# Patient Record
Sex: Female | Born: 1935 | Race: Black or African American | Hispanic: No | Smoking: Former smoker
Health system: Southern US, Community
[De-identification: ages and names within clinical notes are randomized; demographics above are authoritative.]

## PROBLEM LIST (undated history)

## (undated) ENCOUNTER — Emergency Department (HOSPITAL_COMMUNITY): Payer: Medicare Other | Source: Home / Self Care

## (undated) DIAGNOSIS — I495 Sick sinus syndrome: Secondary | ICD-10-CM

## (undated) DIAGNOSIS — M109 Gout, unspecified: Secondary | ICD-10-CM

## (undated) DIAGNOSIS — N200 Calculus of kidney: Secondary | ICD-10-CM

## (undated) DIAGNOSIS — I10 Essential (primary) hypertension: Secondary | ICD-10-CM

## (undated) DIAGNOSIS — F329 Major depressive disorder, single episode, unspecified: Secondary | ICD-10-CM

## (undated) DIAGNOSIS — E669 Obesity, unspecified: Secondary | ICD-10-CM

## (undated) DIAGNOSIS — H409 Unspecified glaucoma: Secondary | ICD-10-CM

## (undated) DIAGNOSIS — I5022 Chronic systolic (congestive) heart failure: Secondary | ICD-10-CM

## (undated) DIAGNOSIS — F32A Depression, unspecified: Secondary | ICD-10-CM

## (undated) DIAGNOSIS — J449 Chronic obstructive pulmonary disease, unspecified: Secondary | ICD-10-CM

## (undated) DIAGNOSIS — I429 Cardiomyopathy, unspecified: Secondary | ICD-10-CM

## (undated) DIAGNOSIS — M1990 Unspecified osteoarthritis, unspecified site: Secondary | ICD-10-CM

## (undated) DIAGNOSIS — N183 Chronic kidney disease, stage 3 unspecified: Secondary | ICD-10-CM

## (undated) DIAGNOSIS — K219 Gastro-esophageal reflux disease without esophagitis: Secondary | ICD-10-CM

## (undated) HISTORY — PX: INSERT / REPLACE / REMOVE PACEMAKER: SUR710

## (undated) HISTORY — PX: TUBAL LIGATION: SHX77

## (undated) HISTORY — DX: Chronic obstructive pulmonary disease, unspecified: J44.9

## (undated) HISTORY — DX: Essential (primary) hypertension: I10

## (undated) HISTORY — DX: Chronic kidney disease, stage 3 unspecified: N18.30

## (undated) HISTORY — DX: Major depressive disorder, single episode, unspecified: F32.9

## (undated) HISTORY — DX: Depression, unspecified: F32.A

## (undated) HISTORY — DX: Gout, unspecified: M10.9

## (undated) HISTORY — DX: Obesity, unspecified: E66.9

## (undated) HISTORY — DX: Cardiomyopathy, unspecified: I42.9

## (undated) HISTORY — DX: Chronic systolic (congestive) heart failure: I50.22

## (undated) HISTORY — PX: CHOLECYSTECTOMY OPEN: SUR202

## (undated) HISTORY — DX: Sick sinus syndrome: I49.5

## (undated) HISTORY — DX: Chronic kidney disease, stage 3 (moderate): N18.3

## (undated) HISTORY — PX: KIDNEY STONE SURGERY: SHX686

---

## 1997-10-04 ENCOUNTER — Ambulatory Visit (HOSPITAL_COMMUNITY): Admission: RE | Admit: 1997-10-04 | Discharge: 1997-10-05 | Payer: Self-pay | Admitting: *Deleted

## 1999-01-10 ENCOUNTER — Ambulatory Visit (HOSPITAL_COMMUNITY): Admission: RE | Admit: 1999-01-10 | Discharge: 1999-01-10 | Payer: Self-pay | Admitting: Cardiology

## 1999-01-10 ENCOUNTER — Encounter: Payer: Self-pay | Admitting: Cardiology

## 2000-09-10 ENCOUNTER — Encounter: Payer: Self-pay | Admitting: Cardiology

## 2000-09-10 ENCOUNTER — Ambulatory Visit (HOSPITAL_COMMUNITY): Admission: RE | Admit: 2000-09-10 | Discharge: 2000-09-10 | Payer: Self-pay | Admitting: Cardiology

## 2000-10-23 HISTORY — PX: CARDIAC CATHETERIZATION: SHX172

## 2000-10-28 ENCOUNTER — Ambulatory Visit (HOSPITAL_COMMUNITY): Admission: RE | Admit: 2000-10-28 | Discharge: 2000-10-28 | Payer: Self-pay | Admitting: *Deleted

## 2003-04-18 ENCOUNTER — Emergency Department (HOSPITAL_COMMUNITY): Admission: EM | Admit: 2003-04-18 | Discharge: 2003-04-18 | Payer: Self-pay | Admitting: Emergency Medicine

## 2003-09-20 ENCOUNTER — Inpatient Hospital Stay (HOSPITAL_COMMUNITY): Admission: EM | Admit: 2003-09-20 | Discharge: 2003-09-22 | Payer: Self-pay | Admitting: Emergency Medicine

## 2003-09-21 ENCOUNTER — Encounter (INDEPENDENT_AMBULATORY_CARE_PROVIDER_SITE_OTHER): Payer: Self-pay | Admitting: *Deleted

## 2006-04-08 ENCOUNTER — Ambulatory Visit (HOSPITAL_COMMUNITY): Admission: RE | Admit: 2006-04-08 | Discharge: 2006-04-09 | Payer: Self-pay | Admitting: Cardiology

## 2006-11-24 ENCOUNTER — Emergency Department (HOSPITAL_COMMUNITY): Admission: EM | Admit: 2006-11-24 | Discharge: 2006-11-24 | Payer: Self-pay | Admitting: Family Medicine

## 2008-10-18 ENCOUNTER — Inpatient Hospital Stay (HOSPITAL_COMMUNITY): Admission: EM | Admit: 2008-10-18 | Discharge: 2008-10-19 | Payer: Self-pay | Admitting: Emergency Medicine

## 2010-04-15 ENCOUNTER — Encounter (HOSPITAL_COMMUNITY)
Admission: RE | Admit: 2010-04-15 | Discharge: 2010-05-23 | Payer: Self-pay | Source: Home / Self Care | Attending: Family Medicine | Admitting: Family Medicine

## 2010-06-14 ENCOUNTER — Encounter: Payer: Self-pay | Admitting: Family Medicine

## 2010-09-02 LAB — CK TOTAL AND CKMB (NOT AT ARMC): Relative Index: INVALID (ref 0.0–2.5)

## 2010-09-02 LAB — POCT I-STAT, CHEM 8
BUN: 25 mg/dL — ABNORMAL HIGH (ref 6–23)
Calcium, Ion: 1.19 mmol/L (ref 1.12–1.32)
Chloride: 107 mEq/L (ref 96–112)
Creatinine, Ser: 1.5 mg/dL — ABNORMAL HIGH (ref 0.4–1.2)
Glucose, Bld: 84 mg/dL (ref 70–99)
HCT: 36 % (ref 36.0–46.0)
Hemoglobin: 12.2 g/dL (ref 12.0–15.0)
Potassium: 4.4 mEq/L (ref 3.5–5.1)
TCO2: 29 mmol/L (ref 0–100)

## 2010-09-02 LAB — POCT CARDIAC MARKERS
CKMB, poc: 1.3 ng/mL (ref 1.0–8.0)
Troponin i, poc: <0.05 ng/mL (ref 0.00–0.09)

## 2010-09-02 LAB — CARDIAC PANEL(CRET KIN+CKTOT+MB+TROPI)
Relative Index: INVALID (ref 0.0–2.5)
Total CK: 68 U/L (ref 7–177)
Total CK: 89 U/L (ref 7–177)
Troponin I: 0.05 ng/mL (ref 0.00–0.06)

## 2010-09-02 LAB — TSH: TSH: 0.304 u[IU]/mL — ABNORMAL LOW (ref 0.350–4.500)

## 2010-09-12 ENCOUNTER — Other Ambulatory Visit (HOSPITAL_COMMUNITY): Payer: Self-pay | Admitting: Family Medicine

## 2010-09-12 DIAGNOSIS — R0600 Dyspnea, unspecified: Secondary | ICD-10-CM

## 2010-09-15 ENCOUNTER — Other Ambulatory Visit (HOSPITAL_COMMUNITY): Payer: Self-pay

## 2010-10-07 ENCOUNTER — Ambulatory Visit (HOSPITAL_COMMUNITY): Payer: Medicare Other | Attending: Family Medicine | Admitting: Radiology

## 2010-10-07 DIAGNOSIS — R0600 Dyspnea, unspecified: Secondary | ICD-10-CM

## 2010-10-07 DIAGNOSIS — R0989 Other specified symptoms and signs involving the circulatory and respiratory systems: Secondary | ICD-10-CM

## 2010-10-07 DIAGNOSIS — I080 Rheumatic disorders of both mitral and aortic valves: Secondary | ICD-10-CM | POA: Insufficient documentation

## 2010-10-07 DIAGNOSIS — I079 Rheumatic tricuspid valve disease, unspecified: Secondary | ICD-10-CM | POA: Insufficient documentation

## 2010-10-07 DIAGNOSIS — R0609 Other forms of dyspnea: Secondary | ICD-10-CM | POA: Insufficient documentation

## 2010-10-08 ENCOUNTER — Encounter: Payer: Self-pay | Admitting: Family Medicine

## 2010-10-10 NOTE — Discharge Summary (Signed)
[name], [name]                ACCOUNT NO.:  [id number]   MEDICAL RECORD NO.:  [id number]          PATIENT TYPE:  OIB   LOCATION:  [id number]                         FACILITY:  [facility]   PHYSICIAN:  Barnett Abu, M.D.  DATE OF BIRTH:  [birth year]   DATE OF ADMISSION:  04/08/2006  DATE OF DISCHARGE:  04/09/2006                               DISCHARGE SUMMARY   DISCHARGE DIAGNOSES:  1. Tachybrady syndrome status post initial pacemaker in 1993, battery      change out in 2002, and then generator change out April 08, 2006      with ventricular lead revision.  2. Hypertension.  3. Obesity.  4. Remote cholecystectomy.  5. Long-term medication use.   HOSPITAL COURSE:  Ms. Altimari is an 77 year old female with a known  history of tachybrady syndrome and underwent a permanent pacemaker as  described above.  She is at end of life and she underwent a pacemaker  generator change out on April 08, 2006 with a ventricular lead  revision.  The patient tolerated the procedure well and remained in the  hospital overnight.  She was discharged home in stable condition.   DISCHARGE MEDICATIONS:  Similar to her home medications, which include:   1. Clonidine 0.3 mg twice a day.  2. Toprol XL 100 mg a day.  3. Baby aspirin daily.  4. Cardizem CD 120 mg daily.  5. Lisinopril 40 mg a day.  6. Diovan/hydrochlorothiazide 80/12.5 mg 1 a day.   DISCHARGE INSTRUCTIONS:  She is to remain on a low-fat diet.  A  discharge sheet outlining the activity and wound care was given to the  patient.  She may utilize Tylenol as needed for pain.  She is to call  for any questions or concerns, any swelling, redness, or drainage of the  pacemaker site.      Joesphine Bare, P.A.      Barnett Abu, M.D.  Electronically Signed    LB/MEDQ  D:  04/09/2006  T:  04/09/2006  Job:  [id number]

## 2010-10-10 NOTE — Op Note (Signed)
[place]  Patient:    [name], [name]                       MRN: [id number] Proc. Date: 09/10/00 Adm. Date:  [id number] Disc. Date: [id number] Attending:  Rodell Perna CC:         Jerrye Beavers, M.D.  Robyn Haber, M.D.   Operative Report  PROCEDURE PERFORMED: 1. Removal of permanent pacemaker generator. 2. Replace permanent pacemaker for battery change.  INDICATIONS: Ms. [name] is a 77 year old woman with a pacemaker in place due to tachy-brady syndrome.  It has now reached elective replacement interval.  She is undergoing battery change at this time.  PROCEDURAL NOTE: The patient was brought to the cardiac catheterization laboratory in the post ______ state.  The right prepectoral region was prepped and draped in the usual sterile fashion.  Local anesthesia was obtained with the infiltration of 1% lidocaine.  A 6-7 cm incision was made over the previous pacemaker insertion suture line.  It was carried down by sharp dissection to the prepacemaker fibrous capsule. This was incised, and the pacemaker was delivered.  The leads were disconnected from the pacing generator. Each lead was tested and will be the results as noted below.  The pocket was then copiously irrigated with 1% Kanamycin solution.  The leads were attached into the pacing generator, and the pacing generator was placed back in the pocket. The wound was examined for bleeding, and none was found.  The subcutaneous tissue was approximated using 2-0 Dexon in a running fashion.  The skin was approximated using 5-0 Dexon in a running subcuticular fashion.  Steri-Strips and a sterile dressing were applied.  The patient was transported to the recovery area in a stable condition.  EQUIPMENT DATA: DD:  09/10/00 TD:  09/11/00 Job: [id number]

## 2010-10-10 NOTE — Consult Note (Signed)
NAME:  [name], [name]                          ACCOUNT NO.:  [id number]   MEDICAL RECORD NO.:  [id number]                   PATIENT TYPE:  INP   LOCATION:  2108                                 FACILITY:  [facility]   PHYSICIAN:  Deboraha Sprang, M.D.               DATE OF BIRTH:  1922-12-16   DATE OF CONSULTATION:  09/21/2003  DATE OF DISCHARGE:                                   CONSULTATION   REASON FOR CONSULTATION:  Thank you very much for asking me to see Mrs.  [name] in consultation for recurrence of her ventricular tachycardia.   HISTORY:  She is a 77 year old lady who is not able to give a very good  history, but apparently has had recurrent episodes of exercise intolerance  which have been associated with tachy palpitations.  They are also  associated with fatigue, but she denies lightheadedness.  Yesterday, she  presented to Dr. Don Broach office for pacemaker interrogation, where she was  noted to go into a wide complex tachycardia.  Internal electrograms were  obtained via the pacemaker (see below).  The patient's symptoms were only  modest.  The patient's tachycardia subsequently stopped, and the patient was  then referred for further care, where she subsequently ruled out for  myocardial infarction.  Her TSH was found to be a little bit low, and she  has had no recurrent episodes.   She was initially treated with amiodarone and Lovenox, but these have been  discontinued.   PAST CARDIAC HISTORY:  Notable for a catheterization in June 2002 which  demonstrated normal coronaries.  She also had mild left ventricular  dysfunction at that time with an ejection fraction of about 45%.   PAST MEDICAL HISTORY:  1. Notable for hypertension.  2. Arthritis.  3. There is an issue of medical compliance in the past.   MEDICATIONS:  Her medications on arrival to the office included:  1. Clonidine 0.3 b.i.d.  2. Toprol 100.  3. Altace 10 b.i.d.  4. Celebrex.  5. Diovan 80.  6.  Maxzide 37.5/25.  7. Aspirin.  8. Potassium supplements, not withstanding that her potassium was low.   ALLERGIES:  CODEINE.   REVIEW OF SYSTEMS:  Noted on the intake sheet from [name], R.N.P., and  are not further recounted here.   SOCIAL HISTORY:  She lives here in [place].  She smokes.  She does not  use alcohol.  Parenthetically, it is her birthday today.   PHYSICAL EXAMINATION:  GENERAL:  She is an elderly African-American female  appearing her stated age.  VITAL SIGNS:  Her blood pressure is 164/64, pulse 67.  Her blood pressure  has been about 156/72, and I do not have a weight for the patient.  HEENT:  No icterus.  No xanthoma.  The neck veins were flat.  The carotids  were brisk and full bilaterally without bruits.  BACK:  Without  kyphosis or scoliosis.  LUNGS:  Clear.  HEART:  Heart sounds were regular without murmurs or gallops.  ABDOMEN:  Soft with active bowel sounds without midline pulsation.  EXTREMITIES:  Femoral pulses were 2+.  Distal pulses were intact.  There was  no clubbing, cyanosis, or edema.  NEUROLOGIC:  Grossly normal.   ELECTROCARDIOGRAMS:  Electrocardiograms are numerous:  1. Electrocardiogram September 20, 2003, [id number], demonstrates sinus rhythm.     Intervals of 0.17/0.12/0.36 with a leftward axis of -70 with a right     bundle branch block, left anterior fascicular block entrancing the     precordial concordance.  2. Electrocardiogram September 20, 2003, [id number], demonstrates a similar     morphology, tachycardia, with a cycle length now of approximately 300 ms.   Review of the internal electrograms represents a tachycardia with a cycle  length that is fairly stable at 300 ms.  We can also identify an AV  relationship with ventricular sensing off of the atrial lead.  This  demonstrated that the Delta AA intervals preceded the Delta VV intervals.  VA time measuring from the onset of surface QRS to the atrial electrogram  was approximately 0  ms.   IMPRESSION:  1. Supraventricular tachycardia, probable AV nodal reentry with a cycle     length of about 280 ms.  2. Baseline right AV block with left axis deviation.  3. History of bradycardia, status post St. Jude's pacemaker implantation.  4. Negative coronary artery disease with left ventricular ejection fraction     of 45% a couple of years ago.  5. Hypertension.  6. Presyncope and lightheadedness associated with #1.   [name] has recurrent supraventricular tachycardia, probably AV nodal  reentry.  The symptoms with this are modest.  After talking with her, it is  my impression that she does not feel that her symptoms are all that  significant, notwithstanding the alarm that her symptoms created at the  physician's office yesterday.   I discussed treatment options with Dr. Jaci Standard, who, because of concerns  about medication compliance, as well as the recurrence of events, felt that  RS catheter ablation would be her primarily therapy; however, the patient  would like to continue her medical therapy and not undertake any procedures  currently.   Given that, I would recommend that we try adding a low-dose calcium blocker  to her beta blocker.  Her blood pressure certainly would suffice, and we do  not have to be concerned about significant bradycardia.   Thank you for this consultation.                                               Deboraha Sprang, M.D.    SCK/MEDQ  D:  09/21/2003  T:  09/21/2003  Job:  [id number]   cc:   Fabio Asa, M.D.  [place] [place] [address]  Fax: [phone]   Electrophysiology Laboratory   [facility]

## 2010-10-10 NOTE — Cardiovascular Report (Signed)
[place]  Patient:    [name], [name]                       MRN: [id number] Proc. Date: 10/28/00 Adm. Date:  [id number] Attending:  Fabio Asa A CC:         Robyn Haber, M.D.   Cardiac Catheterization  INDICATIONS:  This is a 77 year old female with chief complaint of dyspnea, fatigue, and atypical chest pain.  Adenosine Cardiolite was performed and the patient was noted to have decreased uptake in the anterior apical wall with associated wall motion abnormalities.  The risks, benefits, and options were discussed with the patient.  She subsequently was referred for coronary angiography.  PROCEDURE:  After obtaining written informed consent, the patient was brought to the cardiac catheterization laboratory in the postabsorptive state. Preoperative sedation was achieved using IV Versed, IV Benadryl.  The right groin was prepped and draped in the usual sterile fashion.  Local anesthesia was achieved using 1% Xylocaine.  A 6-French hemostasis sheath was placed in the right femoral artery using the modified Seldinger technique.  Selective coronary angiography was performed using a JL-4 and JR-4 Judkins catheter. Single-plane ventriculogram was performed in the RAO position using a 6-French straight pigtail catheter.  All catheter exchanges were made over a guide wire.  FINDINGS:  The aortic pressure was 200/82.  LV pressure was 200/30.  Single-plane ventriculogram confirmed anterior/interior apical hypokinesis to akinesis.  Ejection fraction was 45%.  CORONARY ANGIOGRAPHY: Left main coronary:  The left main coronary artery bifurcated into the left anterior descending and circumflex vessels.  There was no significant disease in the left main coronary artery.  Left anterior descending:  The left anterior descending gave rise to a small D-1, D-2, D-3, and D-4, then went on to end as an apical recurrent branch. There was no significant  disease in the left anterior descending or its branches.  Circumflex:  The circumflex vessel was a large dominant artery giving rise to a large OM-1, large OM-2, and a large PL branch.  There was no significant disease in the circumflex or its branches.  Right coronary artery:  The right coronary artery was nondominant and gave rise to multiple RV marginal branches.  IMPRESSION: 1. Anterior/interior apical hypokinesis to akinesis with ejection fraction of    approximately 45%. 2. Normal coronary angiography.  RECOMMENDATION:  Continue with medical management of her LV dysfunction and hypertension.  We will consider adding diuretic to her Toprol and Altace regimen. DD:  10/28/00 TD:  10/28/00 Job: [id number]

## 2010-10-10 NOTE — Op Note (Signed)
[place]  Patient:    [name], [name]                       MRN: [id number] Proc. Date: 09/29/00 Adm. Date:  [id number] Disc. Date: [id number] Attending:  Rodell Perna                           Operative Report  ADDENDUM:  EQUIPMENT DATA:  The pacing generator inserted was a Human resources officer number [id number], serial number [id number].  The device explanted was a [employer] II DD model number D2497086, serial number [id number].  PACING DATA:  The atrial lead detected a 3.9 mV P-wave.  The pacing threshold was 0.8 volts at 0.5 msec.  The current was 1.6 mA and the impedance was 500 ohms.  This was an [place] number [id number], serial number [id number].  The ventricular lead detected an 11.8 mV R-wave.  The pacing threshold was 0.7 volts at 0.5 msec pulse width.  The current was 0.4 mA and the impedance was 480 ohms.  This was an intermedics model number 431-07, serial number [id number]. D:  09/29/00 TD:  09/29/00 Job: [id number]

## 2010-10-10 NOTE — Op Note (Signed)
NAMECAMILLIA, [name]                ACCOUNT NO.:  [id number]   MEDICAL RECORD NO.:  [id number]          PATIENT TYPE:  OIB   LOCATION:  2807                         FACILITY:  [facility]   PHYSICIAN:  Barnett Abu, M.D.  DATE OF BIRTH:  1923/01/04   DATE OF PROCEDURE:  04/08/2006  DATE OF DISCHARGE:                                 OPERATIVE REPORT   PROCEDURES PERFORMED:  1. Explant old pacing generator.  2. Ventricular lead replacement/revision.  3. Implant new pacing generator.   INDICATION:  [name] is an 77 year old woman who has a Actor DR, model number [id number], implanted September 10, 2000, now at [facility].  She is to undergo battery replacement.  During the procedure, there was  noted to be very low impedance on the ventricular lead and obvious  insulation failure.  It, therefore, required replacement.   PROCEDURE NOTE:  The patient is brought to the cardiac catheterization  laboratory in a fasting state.  The right prepectoral region was prepped and  draped in the usual sterile fashion.  Local anesthesia was obtained with  infiltration of 1% lidocaine with epinephrine throughout this area.  A 6-7  cm incision was then made over the old pacemaker incision site and this was  carried down by blunt dissection to the pacemaker capsule.  The capsule was  incised and after some modification of the pocket, the pacemaker device was  delivered.  The pacing leads were tested.  The atrial lead had normal  parameters as will be mentioned below.  The ventricular lead, as noted had  low impedance and the insufflation failure.  It was, therefore, cut and  capped as well as tied off with 0 silk.  The right subclavian vein was then  punctured using a micropuncture needle and subsequently a 0.038 wire was  advanced into the right atrium.  A 7-French tearaway sheath and dilator were  then advanced over this wire.  The wire and dilator were removed and the  ventricular lead advanced  to the level of the right atrium.  Using standard  technique and fluoroscopic landmarks, the lead was manipulated into the  right ventricular apex.  There, excellent pacing parameters were obtained as  will be noted below.  This was an active fixation device and the screw was  advanced as appropriate.  The lead was tested for diaphragmatic pacing at 10  volts and none was found.  The lead was then sutured into place using three  separate silk ligatures.  The pocket was then copiously irrigated with 1%  kanamycin solution.  An expansion of the pocket was required.  This was done  by blunt and sharp dissection.  The pacing leads were then attached to the  new pacing generator, carefully identifying each by its serial number, and  placing each into the appropriate receptacle.  Each lead was tightened into  place and tested for security.  The pacing generator was then placed in the  pocket with a small amount of ventricular lead coiled beneath it.  The  pocket was then closed using 2-0  Vicryl in a running fashion for the  subcutaneous layer.  The skin was approximated using 4-0 Vicryl in a running  subcuticular fashion.  Steri-Strips and a sterile dressing were applied.  The patient was transported to the recovery area in stable condition.   EQUIPMENT DATA:  The new pacing generator is a St. Jude Varity ADX XL DR,  model number [id number], serial number [id number].  The new right ventricular lead is  a St. Jude, model number [id number], serial number [id number].  The atrial lead is  an [employer], model number [id number], serial number [id number], date of implant  March 15, 1992.   PACING DATA:  The ventricular lead detected an 9.3 mV R-wave.  The pacing  threshold was 0.8 volts at 0.5 milliseconds pulse width.  The impedance was  815 ohms resulting in a current at capture threshold of 1 MA.  The  atrial lead detected 3.6 mV P-wave.  The impedance was 316 ohms.  The  threshold was 0.4 volts at 0.5 milliseconds  resulting in a current at  capture threshold of 1.8 MA.  The impedance on the old right ventricular  lead before replacement was 238 ohms.      Barnett Abu, M.D.  Electronically Signed     JHE/MEDQ  D:  04/08/2006  T:  04/08/2006  Job:  [id number]

## 2010-11-27 ENCOUNTER — Encounter: Payer: Self-pay | Admitting: Family Medicine

## 2010-11-27 DIAGNOSIS — I495 Sick sinus syndrome: Secondary | ICD-10-CM | POA: Insufficient documentation

## 2010-11-27 DIAGNOSIS — I10 Essential (primary) hypertension: Secondary | ICD-10-CM | POA: Insufficient documentation

## 2010-11-27 DIAGNOSIS — F32A Depression, unspecified: Secondary | ICD-10-CM | POA: Insufficient documentation

## 2010-11-27 DIAGNOSIS — F329 Major depressive disorder, single episode, unspecified: Secondary | ICD-10-CM | POA: Insufficient documentation

## 2010-11-27 DIAGNOSIS — E669 Obesity, unspecified: Secondary | ICD-10-CM | POA: Insufficient documentation

## 2012-01-07 ENCOUNTER — Ambulatory Visit: Payer: Medicare Other | Admitting: Internal Medicine

## 2012-01-29 ENCOUNTER — Encounter: Payer: Self-pay | Admitting: Internal Medicine

## 2012-01-29 ENCOUNTER — Ambulatory Visit (INDEPENDENT_AMBULATORY_CARE_PROVIDER_SITE_OTHER): Payer: Medicare Other | Admitting: *Deleted

## 2012-01-29 ENCOUNTER — Ambulatory Visit (INDEPENDENT_AMBULATORY_CARE_PROVIDER_SITE_OTHER): Payer: Medicare Other | Admitting: Cardiovascular Disease

## 2012-01-29 VITALS — BP 130/80 | HR 74 | Resp 16

## 2012-01-29 DIAGNOSIS — I495 Sick sinus syndrome: Secondary | ICD-10-CM

## 2012-01-29 DIAGNOSIS — R0600 Dyspnea, unspecified: Secondary | ICD-10-CM

## 2012-01-29 DIAGNOSIS — R0989 Other specified symptoms and signs involving the circulatory and respiratory systems: Secondary | ICD-10-CM

## 2012-01-29 DIAGNOSIS — J810 Acute pulmonary edema: Secondary | ICD-10-CM | POA: Insufficient documentation

## 2012-01-29 DIAGNOSIS — Z950 Presence of cardiac pacemaker: Secondary | ICD-10-CM

## 2012-01-29 DIAGNOSIS — R0609 Other forms of dyspnea: Secondary | ICD-10-CM

## 2012-01-29 DIAGNOSIS — I471 Supraventricular tachycardia: Secondary | ICD-10-CM

## 2012-01-29 DIAGNOSIS — I498 Other specified cardiac arrhythmias: Secondary | ICD-10-CM

## 2012-01-29 LAB — PACEMAKER DEVICE OBSERVATION
BAMS-0001: 180 {beats}/min
BAMS-0003: 70 {beats}/min
BATTERY VOLTAGE: 2.76 V
DEVICE MODEL PM: 1124188

## 2012-01-29 NOTE — Assessment & Plan Note (Signed)
Well controlled.  Continue current medications and low sodium Dash type diet.    

## 2012-01-29 NOTE — Progress Notes (Signed)
Patient ID: [name], female   DOB: [birth year], 78 y.o.   MRN: [id number] 78 yo patient of Dr Caryl Comes  Last seen 2005 but main issue is SVT and tachy brady with previous pacer. Pacer is Financial planner.  She was in [place] with her son but has moved back here to live with daughter.  Poor historian but I dont think her pacer has been followed closely.  History of SVT but she declined ablation in past.  EF 45% with previous cath no CAD  Has some exertional dyspnea and LE edema.  Ambulates with cane. Denies palpitations, PND orhtopnea or chest pain.    Pacer Interogation:  Battery is fine.  Lead impedence is fine.  Program DDDR AT/AF response DDIR   Frequent mode swithes but very short duration No stored EMGls  ? Many short bursts SVT AMS episodes 1842    Echo 5/12 Study Conclusions  - Left ventricle: The cavity size was normal. Wall thickness was increased in a pattern of mild LVH. Systolic function was normal. The estimated ejection fraction was in the range of 55% to 60%. Wall motion was normal; there were no regional wall motion abnormalities. Doppler parameters are consistent with abnormal left ventricular relaxation (grade 1 diastolic dysfunction). Doppler parameters are consistent with high ventricular filling pressure. - Aortic valve: Trivial regurgitation. - Mitral valve: Mild regurgitation. - Left atrium: The atrium was mildly dilated. - Right atrium: The atrium was mildly dilated. - Atrial septum: There was an atrial septal aneurysm. - Pulmonary arteries: Systolic pressure was mildly increased. PA peak pressure: 20mm Hg (S).   ROS: Denies fever, malais, weight loss, blurry vision, decreased visual acuity, cough, sputum, SOB, hemoptysis, pleuritic pain, palpitaitons, heartburn, abdominal pain, melena, lower extremity edema, claudication, or rash.  All other systems reviewed and negative   General: Affect appropriate Obese elderly black female HEENT: normal Neck supple  with no adenopathy JVP normal no bruits no thyromegaly Lungs clear with no wheezing and good diaphragmatic motion Heart:  S1/S2 no murmur,rub, gallop or click PMI normal Abdomen: benighn, BS positve, no tenderness, no AAA no bruit.  No HSM or HJR Distal pulses intact with no bruits No edema Neuro non-focal Skin warm and dry No muscular weakness Pacer under lright  clavicle  Medications Current Outpatient Prescriptions  Medication Sig Dispense Refill  . acetaminophen (TYLENOL) 160 MG tablet Take 160 mg by mouth every 6 (six) hours as needed.        Marland Kitchen amLODipine (NORVASC) 10 MG tablet Take 10 mg by mouth daily.        . cloNIDine (CATAPRES) 0.3 MG tablet Take 0.3 mg by mouth 2 (two) times daily.        Marland Kitchen diltiazem (CARDIZEM CD) 120 MG 24 hr capsule Take 120 mg by mouth daily.        . ergocalciferol (VITAMIN D2) 50000 UNITS capsule Take 50,000 Units by mouth once a week.        Marland Kitchen lisinopril (PRINIVIL,ZESTRIL) 40 MG tablet Take 40 mg by mouth daily.        . metoprolol (TOPROL-XL) 100 MG 24 hr tablet Take 100 mg by mouth daily.        . valsartan-hydrochlorothiazide (DIOVAN-HCT) 160-12.5 MG per tablet Take 1 tablet by mouth daily.          Allergies Review of patient's allergies indicates no known allergies.  Family History: No family history on file.  Social History: History   Social History  . Marital  Status: Widowed    Spouse Name: N/A    Number of Children: N/A  . Years of Education: N/A   Occupational History  . Not on file.   Social History Main Topics  . Smoking status: Not on file  . Smokeless tobacco: Not on file  . Alcohol Use: Not on file  . Drug Use: Not on file  . Sexually Active: Not on file   Other Topics Concern  . Not on file   Social History Narrative  . No narrative on file    Electrocardiogram:  Assessment and Plan

## 2012-01-29 NOTE — Patient Instructions (Addendum)
Your physician recommends that you schedule a follow-up appointment in: NEEDS F/U WITH DR Argyle Crowley Your physician recommends that you continue on your current medications as directed. Please refer to the Current Medication list given to you today. Your physician has requested that you have an echocardiogram. Echocardiography is a painless test that uses sound waves to create images of your heart. It provides your doctor with information about the size and shape of your heart and how well your heart's chambers and valves are working. This procedure takes approximately one hour. There are no restrictions for this procedure.

## 2012-01-29 NOTE — Assessment & Plan Note (Signed)
Discussed with Melton Alar  Will interogate pacer today. Not dependant.  Continue beta blocker F/U Dr Caryl Comes

## 2012-01-29 NOTE — Progress Notes (Signed)
PPM check 

## 2012-01-29 NOTE — Assessment & Plan Note (Signed)
History of EF 45%  Mild LE edema Continue lasix  Echo to reassess EF

## 2012-01-29 NOTE — Assessment & Plan Note (Signed)
See Dr Aquilla Hacker prevoius note.  Patient not symptomatic and previously declined ablation Contineu beta blocker and F/U with SK

## 2012-02-03 ENCOUNTER — Other Ambulatory Visit (HOSPITAL_COMMUNITY): Payer: Medicare Other

## 2012-02-23 ENCOUNTER — Ambulatory Visit (HOSPITAL_COMMUNITY): Payer: Medicare Other | Attending: Cardiology

## 2012-02-23 DIAGNOSIS — I080 Rheumatic disorders of both mitral and aortic valves: Secondary | ICD-10-CM | POA: Insufficient documentation

## 2012-02-23 DIAGNOSIS — R0989 Other specified symptoms and signs involving the circulatory and respiratory systems: Secondary | ICD-10-CM | POA: Insufficient documentation

## 2012-02-23 DIAGNOSIS — I369 Nonrheumatic tricuspid valve disorder, unspecified: Secondary | ICD-10-CM | POA: Insufficient documentation

## 2012-02-23 DIAGNOSIS — I495 Sick sinus syndrome: Secondary | ICD-10-CM

## 2012-02-23 DIAGNOSIS — R0609 Other forms of dyspnea: Secondary | ICD-10-CM | POA: Insufficient documentation

## 2012-02-23 DIAGNOSIS — R0602 Shortness of breath: Secondary | ICD-10-CM

## 2012-02-23 DIAGNOSIS — I379 Nonrheumatic pulmonary valve disorder, unspecified: Secondary | ICD-10-CM | POA: Insufficient documentation

## 2012-02-23 NOTE — Progress Notes (Signed)
Echocardiogram performed.  

## 2012-03-17 ENCOUNTER — Encounter: Payer: Self-pay | Admitting: *Deleted

## 2012-03-17 DIAGNOSIS — Z950 Presence of cardiac pacemaker: Secondary | ICD-10-CM | POA: Insufficient documentation

## 2012-03-25 ENCOUNTER — Encounter: Payer: Self-pay | Admitting: Internal Medicine

## 2012-03-25 ENCOUNTER — Ambulatory Visit (INDEPENDENT_AMBULATORY_CARE_PROVIDER_SITE_OTHER): Payer: Medicare Other | Admitting: Internal Medicine

## 2012-03-25 VITALS — BP 171/73 | HR 67 | Ht 65.0 in | Wt 209.0 lb

## 2012-03-25 DIAGNOSIS — I10 Essential (primary) hypertension: Secondary | ICD-10-CM

## 2012-03-25 DIAGNOSIS — I498 Other specified cardiac arrhythmias: Secondary | ICD-10-CM

## 2012-03-25 DIAGNOSIS — I495 Sick sinus syndrome: Secondary | ICD-10-CM

## 2012-03-25 LAB — PACEMAKER DEVICE OBSERVATION
AL AMPLITUDE: 1.5 mv
AL THRESHOLD: 0.75 V
ATRIAL PACING PM: 89
BAMS-0003: 70 {beats}/min
BATTERY VOLTAGE: 2.78 V
RV LEAD THRESHOLD: 1.12 V
VENTRICULAR PACING PM: 21

## 2012-03-25 NOTE — Assessment & Plan Note (Signed)
High pressure today. She says it happens at the doctor's office. We'll leave to her PCP

## 2012-03-25 NOTE — Patient Instructions (Addendum)
Your physician recommends that you schedule a follow-up appointment in: 6 months with Device clinic

## 2012-03-25 NOTE — Assessment & Plan Note (Signed)
stable °

## 2012-03-25 NOTE — Progress Notes (Signed)
Patient Care Team: [name], MD as PCP - General (Family Medicine)   HPI  [name] is a 78 y.o. female Seen in followup for a pacemaker implanted originally in 2002 replaced by Dr. Bishop Limbo in 2007. 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 Jude device and he underwent lead revision also in 2007   He is not clear who her primary cardiology services at this point. He sees Dr. Dennard Schaumann for her blood pressure. She denies significant chest pain or changes in her functional status   Past Medical History  Diagnosis Date  . Hypertension   . Obesity   . Depression   . Tachy-brady syndrome     Past Surgical History  Procedure Date  . Pacemaker insertion     Current Outpatient Prescriptions  Medication Sig Dispense Refill  . acetaminophen (TYLENOL) 160 MG tablet Take 160 mg by mouth every 6 (six) hours as needed.        . cloNIDine (CATAPRES) 0.3 MG tablet Take 0.3 mg by mouth 2 (two) times daily.        Marland Kitchen amLODipine (NORVASC) 10 MG tablet Take 10 mg by mouth daily.        Marland Kitchen diltiazem (CARDIZEM CD) 120 MG 24 hr capsule Take 120 mg by mouth daily.        . ergocalciferol (VITAMIN D2) 50000 UNITS capsule Take 50,000 Units by mouth once a week.        Marland Kitchen lisinopril (PRINIVIL,ZESTRIL) 40 MG tablet Take 40 mg by mouth daily.        . metoprolol (TOPROL-XL) 100 MG 24 hr tablet Take 100 mg by mouth daily.        . valsartan-hydrochlorothiazide (DIOVAN-HCT) 160-12.5 MG per tablet Take 1 tablet by mouth daily.          No Known Allergies  Review of Systems negative except from HPI and PMH  Physical Exam Pulse 67  Ht 5\' 5"  (1.651 m)  Wt 209 lb (94.802 kg)  BMI 34.78 kg/m2  SpO2 96% Well developed and well nourished in no acute distress HENT normal Neck supple  Clear Device pocket well healed; without hematoma or erythema Regular rate and rhythm, no murmurs or gallops Abd-soft   No Clubbing cyanosis  Tr edema Skin-warm and dry A & Oriented      Assessment and  Plan

## 2012-05-03 ENCOUNTER — Ambulatory Visit: Payer: Medicare Other | Admitting: Cardiovascular Disease

## 2012-05-10 ENCOUNTER — Other Ambulatory Visit: Payer: Self-pay | Admitting: Family Medicine

## 2012-05-10 ENCOUNTER — Ambulatory Visit
Admission: RE | Admit: 2012-05-10 | Discharge: 2012-05-10 | Disposition: A | Discharge status: Home / Self Care | Payer: Medicare Other | Source: Ambulatory Visit | Attending: Family Medicine | Admitting: Family Medicine

## 2012-05-10 DIAGNOSIS — R05 Cough: Secondary | ICD-10-CM

## 2012-05-10 DIAGNOSIS — R059 Cough, unspecified: Secondary | ICD-10-CM

## 2012-09-08 DIAGNOSIS — I10 Essential (primary) hypertension: Secondary | ICD-10-CM

## 2012-09-08 DIAGNOSIS — R269 Unspecified abnormalities of gait and mobility: Secondary | ICD-10-CM

## 2012-09-08 DIAGNOSIS — IMO0002 Reserved for concepts with insufficient information to code with codable children: Secondary | ICD-10-CM

## 2012-09-08 DIAGNOSIS — M1710 Unilateral primary osteoarthritis, unspecified knee: Secondary | ICD-10-CM

## 2012-09-21 ENCOUNTER — Ambulatory Visit (INDEPENDENT_AMBULATORY_CARE_PROVIDER_SITE_OTHER): Payer: Medicare Other | Admitting: *Deleted

## 2012-09-21 ENCOUNTER — Other Ambulatory Visit: Payer: Self-pay

## 2012-09-21 DIAGNOSIS — I495 Sick sinus syndrome: Secondary | ICD-10-CM

## 2012-09-21 LAB — PACEMAKER DEVICE OBSERVATION
AL AMPLITUDE: 1.2 mv
ATRIAL PACING PM: 86
BATTERY VOLTAGE: 2.76 V

## 2012-09-21 NOTE — Progress Notes (Signed)
PPM check 

## 2012-09-26 ENCOUNTER — Ambulatory Visit: Payer: Medicare Other | Admitting: Family Medicine

## 2012-09-30 ENCOUNTER — Ambulatory Visit (INDEPENDENT_AMBULATORY_CARE_PROVIDER_SITE_OTHER): Payer: Medicare Other | Admitting: Family Medicine

## 2012-09-30 ENCOUNTER — Encounter: Payer: Self-pay | Admitting: Family Medicine

## 2012-09-30 VITALS — BP 136/70 | HR 76 | Temp 98.2°F | Resp 18 | Wt 198.0 lb

## 2012-09-30 DIAGNOSIS — N189 Chronic kidney disease, unspecified: Secondary | ICD-10-CM | POA: Insufficient documentation

## 2012-09-30 DIAGNOSIS — I10 Essential (primary) hypertension: Secondary | ICD-10-CM

## 2012-09-30 DIAGNOSIS — M129 Arthropathy, unspecified: Secondary | ICD-10-CM

## 2012-09-30 DIAGNOSIS — M1990 Unspecified osteoarthritis, unspecified site: Secondary | ICD-10-CM

## 2012-09-30 DIAGNOSIS — M109 Gout, unspecified: Secondary | ICD-10-CM | POA: Insufficient documentation

## 2012-09-30 NOTE — Progress Notes (Signed)
Subjective:    Patient ID: [name], female    DOB: 1922/06/13, 79 y.o.   MRN: [id number]  HPI Patient is here today for followup of hypertension. She is has a history of tachybradycardia syndrome and supraventricular tachycardia. She has a Corporate investment banker. She is on numerous medications including amlodipine, coreg, clonidine, diltiazem, hydrochlorothiazide, lisinopril, Toprol.  Patient is not sure what she's taking. I am not sure what she's taking. I certainly don't want her on carvedilol and metoprolol. Furthermore heart rate today is 58 on exam. She also has a history of gout which would be exacerbated by hydrochlorothiazide.  She also has a history of chronic kidney disease.  Fortunately her blood pressure is well-controlled at 136/70. However she reports fatigue and dyspnea on exertion.  She also complains of pain in her right knee. She has history of gout. The knee is not red, hot. There are palpable arthritic changes in both joint lines. She is tender to palpation along both joint lines.  She also has a scaly rash on the medial aspect of her right shin. There is a hyperpigmented patch. It has the consistency of venous stasis dermatitis. However the center is almost black raising the concern for possible melanoma. She is attended today by her daughter. Past Medical History  Diagnosis Date  . Hypertension   . Obesity   . Depression   . Tachy-brady syndrome   . Chronic kidney disease   . Gout    Current Outpatient Prescriptions on File Prior to Visit  Medication Sig Dispense Refill  . acetaminophen (TYLENOL) 160 MG tablet Take 160 mg by mouth every 6 (six) hours as needed.        Marland Kitchen amLODipine (NORVASC) 10 MG tablet Take 10 mg by mouth daily.        . cloNIDine (CATAPRES) 0.3 MG tablet Take 0.3 mg by mouth 2 (two) times daily.        Marland Kitchen diltiazem (CARDIZEM CD) 120 MG 24 hr capsule Take 120 mg by mouth daily.        Marland Kitchen lisinopril (PRINIVIL,ZESTRIL) 40 MG tablet Take 40 mg by mouth  daily.        . metoprolol (TOPROL-XL) 100 MG 24 hr tablet Take 100 mg by mouth daily.         No current facility-administered medications on file prior to visit.   No Known Allergies History   Social History  . Marital Status: Widowed    Spouse Name: N/A    Number of Children: N/A  . Years of Education: N/A   Occupational History  . Not on file.   Social History Main Topics  . Smoking status: Former Research scientist (life sciences)  . Smokeless tobacco: Not on file  . Alcohol Use: Not on file  . Drug Use: Not on file  . Sexually Active: Not on file   Other Topics Concern  . Not on file   Social History Narrative  . No narrative on file  \  Review of Systems  All other systems reviewed and are negative.       Objective:   Physical Exam  Constitutional: She appears well-developed and well-nourished.  Cardiovascular: Normal rate, regular rhythm and normal heart sounds.   No murmur heard. Pulmonary/Chest: Effort normal and breath sounds normal. No respiratory distress. She has no wheezes. She has no rales. She exhibits no tenderness.  Abdominal: Soft. Bowel sounds are normal. She exhibits no distension. There is no tenderness. There is no rebound and no  guarding.  Musculoskeletal: She exhibits edema and tenderness (Along both joint lines of the right knee).   skin shows a hyperpigmented scaly patch on the medial aspect of her right shin with a very hyperpigmented center.        Assessment & Plan:  1. HTN (hypertension) I am very concerned about polypharmacy. I explained to the daughter and the patient that I wanted her to go home and call us back immediately with all of her medications that she is actually taking. That way we can update her medicine list. Furthermore I like to review this list to cut down on the redundancy. - Basic Metabolic Panel - CBC with Differential - Hepatic Function Panel  2. Gout If she is taking hydrochlorothiazide, right discontinue it because of gout and  chronic kidney  3. Chronic kidney disease Check BMP  #4 venous stasis dermatitis, Elocon 0.1% ointment to apply daily for one week. The rash persist return for a biopsy.  5. Arthritis Due to chronic kidney disease, the patient can only use Tylenol over-the-counter. She can have a cortisone injection if the pain persists.

## 2012-09-30 NOTE — Patient Instructions (Addendum)
Per Dr. Dennard Schaumann .Marland KitchenMarland KitchenPt to stop HCTZ and ASA 81mg  and just take ASA 325mg  qd.

## 2012-09-30 NOTE — Addendum Note (Signed)
Addended by: Shary Decamp B on: 09/30/2012 03:25 PM   Modules accepted: Orders

## 2012-10-03 LAB — HEPATIC FUNCTION PANEL
ALT: <8 U/L (ref 0–35)
Total Protein: 6.9 g/dL (ref 6.0–8.3)

## 2012-10-03 LAB — BASIC METABOLIC PANEL
BUN: 34 mg/dL — ABNORMAL HIGH (ref 6–23)
Chloride: 104 mEq/L (ref 96–112)
Glucose, Bld: 80 mg/dL (ref 70–99)
Potassium: 5.3 mEq/L (ref 3.5–5.3)

## 2012-10-04 LAB — CBC WITH DIFFERENTIAL/PLATELET
MCH: 29.4 pg (ref 26.0–34.0)
MCV: 96.8 fL (ref 78.0–100.0)
Platelets: 236 10*3/uL (ref 150–400)
RDW: 14.7 % (ref 11.5–15.5)

## 2012-10-27 ENCOUNTER — Encounter: Payer: Self-pay | Admitting: Internal Medicine

## 2012-12-05 ENCOUNTER — Telehealth: Payer: Self-pay | Admitting: Physician Assistant

## 2012-12-14 ENCOUNTER — Other Ambulatory Visit: Payer: Self-pay | Admitting: Family Medicine

## 2012-12-14 NOTE — Telephone Encounter (Signed)
Medication refilled per protocol. 

## 2013-01-04 ENCOUNTER — Ambulatory Visit: Payer: Self-pay | Admitting: Family Medicine

## 2013-01-06 NOTE — Telephone Encounter (Signed)
Pt was called.

## 2013-01-11 ENCOUNTER — Ambulatory Visit: Payer: Self-pay | Admitting: Family Medicine

## 2013-03-08 ENCOUNTER — Ambulatory Visit (INDEPENDENT_AMBULATORY_CARE_PROVIDER_SITE_OTHER): Payer: Medicare Other | Admitting: Family Medicine

## 2013-03-08 ENCOUNTER — Ambulatory Visit: Payer: Medicare Other | Admitting: Family Medicine

## 2013-03-08 DIAGNOSIS — Z23 Encounter for immunization: Secondary | ICD-10-CM

## 2013-03-28 ENCOUNTER — Ambulatory Visit (INDEPENDENT_AMBULATORY_CARE_PROVIDER_SITE_OTHER): Payer: Medicare Other | Admitting: Family Medicine

## 2013-03-28 ENCOUNTER — Encounter: Payer: Self-pay | Admitting: Family Medicine

## 2013-03-28 VITALS — BP 146/80 | HR 60 | Temp 97.6°F | Resp 18 | Wt 198.0 lb

## 2013-03-28 DIAGNOSIS — R609 Edema, unspecified: Secondary | ICD-10-CM

## 2013-03-28 DIAGNOSIS — M1990 Unspecified osteoarthritis, unspecified site: Secondary | ICD-10-CM

## 2013-03-28 DIAGNOSIS — M129 Arthropathy, unspecified: Secondary | ICD-10-CM

## 2013-03-28 DIAGNOSIS — I10 Essential (primary) hypertension: Secondary | ICD-10-CM

## 2013-03-28 DIAGNOSIS — L609 Nail disorder, unspecified: Secondary | ICD-10-CM

## 2013-03-28 DIAGNOSIS — N189 Chronic kidney disease, unspecified: Secondary | ICD-10-CM

## 2013-03-28 DIAGNOSIS — R600 Localized edema: Secondary | ICD-10-CM

## 2013-03-28 LAB — CBC WITH DIFFERENTIAL/PLATELET
Basophils Absolute: 0 10*3/uL (ref 0.0–0.1)
Basophils Relative: 0 % (ref 0–1)
Hemoglobin: 11.4 g/dL — ABNORMAL LOW (ref 12.0–15.0)
MCHC: 32.9 g/dL (ref 30.0–36.0)
Monocytes Relative: 11 % (ref 3–12)
Neutro Abs: 3.7 10*3/uL (ref 1.7–7.7)
Neutrophils Relative %: 53 % (ref 43–77)
Platelets: 197 10*3/uL (ref 150–400)

## 2013-03-28 LAB — BASIC METABOLIC PANEL WITH GFR
BUN: 29 mg/dL — ABNORMAL HIGH (ref 6–23)
GFR, Est African American: 48 mL/min — ABNORMAL LOW
GFR, Est Non African American: 41 mL/min — ABNORMAL LOW
Potassium: 5 mEq/L (ref 3.5–5.3)
Sodium: 146 mEq/L — ABNORMAL HIGH (ref 135–145)

## 2013-03-28 NOTE — Patient Instructions (Signed)
Take prescription to the supply store-  Referral to foot doctor Labs today, we will call with results Take 1 Extra strength Tyelnol twice a day  Call Heart doctor for appointment- [phone] F/U 4 months for blood pressure

## 2013-03-30 ENCOUNTER — Encounter: Payer: Self-pay | Admitting: Family Medicine

## 2013-03-30 DIAGNOSIS — L609 Nail disorder, unspecified: Secondary | ICD-10-CM | POA: Insufficient documentation

## 2013-03-30 NOTE — Assessment & Plan Note (Signed)
Acetaminophen BID due to CKD Declined steroid injections

## 2013-03-30 NOTE — Progress Notes (Signed)
  Subjective:    Patient ID: ANNALYCIA CORYELL, female    DOB: [birth year], 79 y.o.   MRN: [id number]  HPI  Pt here for medication review and f/u HTN. No specific concerns. Did not bring medications with her today. Her Grand-daughter is with her today. She is concerned about her feet/nails. HTN- denies CP, has not had f/u with cardiology for pacer this year.  Leg swelling- Diuretic stopped due to worsening CKD, and Gout she tries to elevate feet some.  OA- continues to have difficulty with her knees. No recent falls   Review of Systems - per above  GEN- denies fatigue, fever, weight loss,weakness, recent illness HEENT- denies eye drainage, change in vision, nasal discharge, CVS- denies chest pain, palpitations RESP- denies SOB, cough, wheeze MSK- + joint pain, muscle aches, injury Neuro- denies headache, dizziness, syncope, seizure activity       Objective:   Physical Exam  GEN- NAD, alert and oriented x3 HEENT- PERRL, EOMI, non injected sclera, pink conjunctiva, MMM, oropharynx clear CVS- RRR, no murmur RESP-CTAB ABD-NABS,soft,NT,ND EXT-  1+ Edema to shins- mostly pedal Pulses- Radial, DP- 2+ Skin/Nails- long thick, curling nails on toes, +callus feet, corns, scaling of skin       Assessment & Plan:

## 2013-03-30 NOTE — Assessment & Plan Note (Signed)
Check renal function, continue current meds

## 2013-03-30 NOTE — Assessment & Plan Note (Signed)
Due to CKD, will use compression hose, given script

## 2013-03-30 NOTE — Assessment & Plan Note (Signed)
Well controlled for age, no change to meds She was able to describe all meds, we also used picture search on internet to verify some meds

## 2013-03-30 NOTE — Assessment & Plan Note (Signed)
Long curling hypertrophic nails Referral to podiatry,

## 2013-04-04 ENCOUNTER — Ambulatory Visit: Payer: Medicare Other | Admitting: Podiatry

## 2013-04-14 ENCOUNTER — Ambulatory Visit (INDEPENDENT_AMBULATORY_CARE_PROVIDER_SITE_OTHER): Payer: Medicare Other | Admitting: Podiatry

## 2013-04-14 ENCOUNTER — Encounter: Payer: Self-pay | Admitting: Podiatry

## 2013-04-14 VITALS — BP 163/89 | HR 60 | Wt 198.0 lb

## 2013-04-14 DIAGNOSIS — L602 Onychogryphosis: Secondary | ICD-10-CM | POA: Insufficient documentation

## 2013-04-14 DIAGNOSIS — M79673 Pain in unspecified foot: Secondary | ICD-10-CM | POA: Insufficient documentation

## 2013-04-14 DIAGNOSIS — M79609 Pain in unspecified limb: Secondary | ICD-10-CM

## 2013-04-14 DIAGNOSIS — L609 Nail disorder, unspecified: Secondary | ICD-10-CM

## 2013-04-14 DIAGNOSIS — L608 Other nail disorders: Secondary | ICD-10-CM

## 2013-04-14 NOTE — Progress Notes (Signed)
Subjective: 79 y.o. year old female patient presents complaining of painful nails. Patient requests toe nails, corns and calluses trimmed.   Review of Systems - General ROS: negative for - chills, fatigue, fever, night sweats, sleep disturbance, weight gain or weight loss Ophthalmic ROS: negative ENT ROS: negative Allergy and Immunology ROS: negative Breast ROS: negative for breast lumps Respiratory ROS: no cough, shortness of breath, or wheezing Cardiovascular ROS: Wears pace maker. Gastrointestinal ROS: Take Pepsid for acid. Musculoskeletal ROS: positive for - gait disturbance, joint stiffness and pain in right knee. Neurological ROS: no TIA or stroke symptoms Dermatological ROS: negative  Objective: Dermatologic: Neglected thick dark deformed nails x 10 with foul odor. Plantar calluses under the first MPJ bilateral. Vascular: Pedal pulses are all palpable with mild forefoot edema. Orthopedic: Severe hallux valgus with bunion, contracted lesser digits bilateral. Neurologic: All epicritic and tactile sensations grossly intact.  Assessment: Neglected dystrophic mycotic nails x 10. Plantar callus under the first MPJ bilateral.  Treatment: All mycotic nails and calluses debrided.  Return in 3 months or as needed.

## 2013-04-14 NOTE — Patient Instructions (Signed)
Seen for hypertrophic nails. All nails debrided. Return in 3 months or as needed.  

## 2013-04-25 ENCOUNTER — Encounter: Payer: Self-pay | Admitting: *Deleted

## 2013-04-27 ENCOUNTER — Encounter: Payer: Self-pay | Admitting: Internal Medicine

## 2013-04-27 ENCOUNTER — Ambulatory Visit (INDEPENDENT_AMBULATORY_CARE_PROVIDER_SITE_OTHER): Payer: Medicare Other | Admitting: Internal Medicine

## 2013-04-27 VITALS — BP 172/77 | HR 60 | Ht 67.0 in | Wt 196.8 lb

## 2013-04-27 DIAGNOSIS — I495 Sick sinus syndrome: Secondary | ICD-10-CM

## 2013-04-27 DIAGNOSIS — Z950 Presence of cardiac pacemaker: Secondary | ICD-10-CM

## 2013-04-27 DIAGNOSIS — I10 Essential (primary) hypertension: Secondary | ICD-10-CM

## 2013-04-27 LAB — MDC_IDC_ENUM_SESS_TYPE_INCLINIC
Battery Impedance: 1800 Ohm
Battery Voltage: 2.76 V
Brady Statistic AP VP Percent: 37 %
Brady Statistic RA Percent Paced: 91 %
Brady Statistic RV Percent Paced: 39 %
Date Time Interrogation Session: 20141204173050
Implantable Pulse Generator Serial Number: 1124188
Lead Channel Impedance Value: 217 Ohm
Lead Channel Pacing Threshold Amplitude: 1 V
Lead Channel Pacing Threshold Pulse Width: 0.4 ms
Lead Channel Sensing Intrinsic Amplitude: 0.7 mV
Lead Channel Setting Pacing Amplitude: 1.375

## 2013-04-27 MED ORDER — ASPIRIN 81 MG PO TABS: 81.0000 mg | ORAL_TABLET | Freq: Every day | ORAL | Status: DC

## 2013-04-27 MED ORDER — CARVEDILOL 25 MG PO TABS: 25.0000 mg | ORAL_TABLET | Freq: Two times a day (BID) | ORAL | Status: DC

## 2013-04-27 NOTE — Progress Notes (Signed)
,        Patient Care Team: [name], MD as PCP - General (Family Medicine)   HPI  IKEA POSTEN is a 79 y.o. female Seen in followup for a pacemaker implanted originally in 2002 replaced by Dr. Bishop Limbo in 2007. 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 He sees Dr. Dennard Schaumann for her blood pressure. She denies significant symptoms Echocardiogram 2013 demonstrated normal left ventricular function  Past Medical History  Diagnosis Date  . Hypertension   . Obesity   . Depression   . Tachy-brady syndrome   . Chronic kidney disease   . Gout     Past Surgical History  Procedure Laterality Date  . Pacemaker insertion      Current Outpatient Prescriptions  Medication Sig Dispense Refill  . acetaminophen (TYLENOL) 500 MG tablet Take 500 mg by mouth 2 (two) times daily.      Marland Kitchen allopurinol (ZYLOPRIM) 100 MG tablet TAKE 2 TABLETS BY MOUTH ONCE DAILY  60 tablet  5  . aspirin 325 MG tablet Take 325 mg by mouth daily.      . carvedilol (COREG) 12.5 MG tablet TAKE ONE TABLET BY MOUTH TWICE DAILY  60 tablet  5  . citalopram (CELEXA) 10 MG tablet TAKE 1 TABLET BY MOUTH DAILY  30 tablet  5  . DIGOX 125 MCG tablet TAKE ONE TABLET BY MOUTH DAILY  30 tablet  5  . diltiazem (CARDIZEM CD) 180 MG 24 hr capsule TAKE ONE CAPSULE BY MOUTH DAILY  30 capsule  5  . ferrous sulfate 325 (65 FE) MG tablet Take 325 mg by mouth daily with breakfast.       No current facility-administered medications for this visit.    No Known Allergies  Review of Systems negative except from HPI and PMH  Physical Exam BP 172/77  Pulse 119  Ht 5\' 7"  (1.702 m)  Wt 196 lb 12.8 oz (89.268 kg)  BMI 30.82 kg/m2 Well developed and well nourished in no acute distress HENT normal E scleral and icterus clear Neck Supple JVP flat; carotids brisk and full Clear to ausculation Device pocket well healed; without hematoma or  erythema.  There is no tethering Regular rate and rhythm, no murmurs gallops or rub Soft with active bowel sounds No clubbing cyanosis 1+ Edema Alert and oriented, grossly normal motor and sensory function Skin Warm and Dry  ecg  A paced   Assessment and  Plan

## 2013-04-27 NOTE — Assessment & Plan Note (Signed)
The patient's device was interrogated.  The information was reviewed. No changes were made in the programming.    

## 2013-04-27 NOTE — Patient Instructions (Addendum)
Your physician has recommended you make the following change in your medication:  1) Stop Digoxin 2) Decrease your Aspirin to 81 mg daily 3) Increase your Carvedilol to 25 mg twice daily.  Your physician wants you to follow-up in: 6 months with device clinic.  You will receive a reminder letter in the mail two months in advance. If you don't receive a letter, please call our office to schedule the follow-up appointment.  Your physician wants you to follow-up in: one year with Ileene Hutchinson, PAC.   You will receive a reminder letter in the mail two months in advance. If you don't receive a letter, please call our office to schedule the follow-up appointment.

## 2013-04-27 NOTE — Assessment & Plan Note (Addendum)
Blood pressure is poorly controlled. We'll increase her carvedilol to 12.5--25 mg twice daily.  Also decrease her aspirin from 325--81 mg.

## 2013-04-27 NOTE — Assessment & Plan Note (Signed)
No evidence of SVT on her device. We will discontinue digoxin

## 2013-06-09 ENCOUNTER — Other Ambulatory Visit: Payer: Self-pay | Admitting: Family Medicine

## 2013-06-09 NOTE — Telephone Encounter (Signed)
Ok to refill 

## 2013-06-12 NOTE — Telephone Encounter (Signed)
Ok, but due for ov.

## 2013-06-26 ENCOUNTER — Encounter: Payer: Self-pay | Admitting: Family Medicine

## 2013-06-26 NOTE — Telephone Encounter (Signed)
Pt states she has received a letter about a medication and she don't understand why she has received this letter Call back number [phone]

## 2013-07-03 NOTE — Telephone Encounter (Signed)
This encounter was created in error - please disregard.

## 2013-07-14 ENCOUNTER — Ambulatory Visit: Payer: Medicare Other | Admitting: Podiatry

## 2013-07-17 ENCOUNTER — Ambulatory Visit: Payer: Medicare Other | Admitting: Family Medicine

## 2013-07-17 ENCOUNTER — Ambulatory Visit: Payer: Medicare Other | Admitting: Physician Assistant

## 2013-07-18 ENCOUNTER — Ambulatory Visit: Payer: Medicare Other | Admitting: Family Medicine

## 2013-07-25 ENCOUNTER — Ambulatory Visit: Payer: Self-pay | Admitting: Family Medicine

## 2013-07-26 ENCOUNTER — Encounter: Payer: Self-pay | Admitting: Family Medicine

## 2013-07-26 ENCOUNTER — Ambulatory Visit (INDEPENDENT_AMBULATORY_CARE_PROVIDER_SITE_OTHER): Payer: Medicare Other | Admitting: Family Medicine

## 2013-07-26 VITALS — BP 138/82 | HR 66 | Temp 97.9°F | Resp 14 | Ht 61.5 in | Wt 202.0 lb

## 2013-07-26 DIAGNOSIS — I10 Essential (primary) hypertension: Secondary | ICD-10-CM

## 2013-07-26 DIAGNOSIS — N189 Chronic kidney disease, unspecified: Secondary | ICD-10-CM

## 2013-07-26 DIAGNOSIS — K219 Gastro-esophageal reflux disease without esophagitis: Secondary | ICD-10-CM

## 2013-07-26 LAB — COMPLETE METABOLIC PANEL WITH GFR
ALT: 10 U/L (ref 0–35)
AST: 16 U/L (ref 0–37)
Albumin: 3.6 g/dL (ref 3.5–5.2)
Alkaline Phosphatase: 102 U/L (ref 39–117)
BILIRUBIN TOTAL: 0.4 mg/dL (ref 0.2–1.2)
BUN: 19 mg/dL (ref 6–23)
CALCIUM: 8.9 mg/dL (ref 8.4–10.5)
CHLORIDE: 105 meq/L (ref 96–112)
CO2: 32 mEq/L (ref 19–32)
CREATININE: 0.98 mg/dL (ref 0.50–1.10)
GFR, Est African American: 60 mL/min
GFR, Est Non African American: 52 mL/min — ABNORMAL LOW
Glucose, Bld: 62 mg/dL — ABNORMAL LOW (ref 70–99)
Potassium: 4.7 mEq/L (ref 3.5–5.3)
Sodium: 147 mEq/L — ABNORMAL HIGH (ref 135–145)
Total Protein: 7.1 g/dL (ref 6.0–8.3)

## 2013-07-26 LAB — CBC WITH DIFFERENTIAL/PLATELET
BASOS ABS: 0 10*3/uL (ref 0.0–0.1)
Basophils Relative: 0 % (ref 0–1)
EOS PCT: 5 % (ref 0–5)
Eosinophils Absolute: 0.3 10*3/uL (ref 0.0–0.7)
HCT: 33 % — ABNORMAL LOW (ref 36.0–46.0)
Hemoglobin: 10.6 g/dL — ABNORMAL LOW (ref 12.0–15.0)
LYMPHS PCT: 36 % (ref 12–46)
Lymphs Abs: 2.2 10*3/uL (ref 0.7–4.0)
MCH: 30 pg (ref 26.0–34.0)
MCHC: 32.1 g/dL (ref 30.0–36.0)
MCV: 93.5 fL (ref 78.0–100.0)
Monocytes Absolute: 0.6 10*3/uL (ref 0.1–1.0)
Monocytes Relative: 10 % (ref 3–12)
NEUTROS ABS: 2.9 10*3/uL (ref 1.7–7.7)
NEUTROS PCT: 49 % (ref 43–77)
PLATELETS: 251 10*3/uL (ref 150–400)
RBC: 3.53 MIL/uL — ABNORMAL LOW (ref 3.87–5.11)
RDW: 15 % (ref 11.5–15.5)
WBC: 6 10*3/uL (ref 4.0–10.5)

## 2013-07-26 MED ORDER — OMEPRAZOLE 40 MG PO CPDR: 40.0000 mg | DELAYED_RELEASE_CAPSULE | Freq: Every day | ORAL | Status: DC

## 2013-07-26 NOTE — Patient Instructions (Signed)
New medications for acid reflux Blood pressure looks great F/U 4 months-

## 2013-07-26 NOTE — Assessment & Plan Note (Addendum)
Blood pressure improved, continue current medications  Note she did have all of her medication with her today she was also accompanied by her granddaughter

## 2013-07-26 NOTE — Progress Notes (Signed)
Patient ID: AMANE MANGONE, female   DOB: 1923/05/24, 80 y.o.   MRN: [id number]     Subjective:    Patient ID: MORRIAH GLAAB, female    DOB: 1922-09-17, 80 y.o.   MRN: [id number]  Patient presents for 4 month F/U and nose bleed  patient here for followup chronic medical problems. She's concerned about her stomach. After she each she gets a lot of heartburn and often has acid building up in her chest where she has to vomit after some meals. She states that she drinks Coke if this helps because it makes her belch and she no longer is has any discomfort. She does take Pepcid typically twice a day but it has not been helping recently.  She was seen by her cardiologist for her hypertension her carvedilol was increased to 25 mg twice a day she has done well with this. She also had her pacer interrogated  Sunday she had a nosebleed that came out of nowhere. She does not have any headache or dizziness associated. She's not had any nosebleeds since then    Review Of Systems:  GEN- denies fatigue, fever, weight loss,weakness, recent illness HEENT- denies eye drainage, change in vision, nasal discharge, CVS- denies chest pain, palpitations RESP- denies SOB, cough, wheeze ABD- denies N/V, change in stools, abd pain Neuro- denies headache, dizziness, syncope, seizure activity       Objective:    BP 138/82  Pulse 66  Temp(Src) 97.9 F (36.6 C)  Resp 14  Ht 5' 1.5" (1.562 m)  Wt 202 lb (91.627 kg)  BMI 37.55 kg/m2 GEN- NAD, alert and oriented x3 HEENT- PERRL, EOMI, non injected sclera, pink conjunctiva, MMM, oropharynx clear CVS- RRR, no murmur RESP-CTAB ABD-NABS,soft,NT,ND EXT-  Trace pedal Pulses- Radial, DP- 2+       Assessment & Plan:      Problem List Items Addressed This Visit   Hypertension     Blood pressure improved    Relevant Orders      CBC with Differential (Completed)      COMPLETE METABOLIC PANEL WITH GFR (Completed)   GERD (gastroesophageal reflux disease)   Relevant Medications      omeprazole (PRILOSEC) capsule   Chronic kidney disease - Primary     Recheck renal function    Relevant Orders      COMPLETE METABOLIC PANEL WITH GFR (Completed)      Note: This dictation was prepared with Dragon dictation along with smaller phrase technology. Any transcriptional errors that result from this process are unintentional.

## 2013-07-26 NOTE — Assessment & Plan Note (Signed)
I will change her to Prilosec once a day she can discontinue the Pepcid her abdominal exam is benign

## 2013-07-26 NOTE — Assessment & Plan Note (Signed)
Recheck renal function. ?

## 2013-09-06 ENCOUNTER — Telehealth: Payer: Self-pay | Admitting: Family Medicine

## 2013-09-06 NOTE — Telephone Encounter (Signed)
Need written prescription to [facility] for Shower Bench/Chair.

## 2013-09-07 MED ORDER — UNABLE TO FIND: 1.0000 | Freq: Once | Status: DC

## 2013-09-07 NOTE — Telephone Encounter (Signed)
Order for shower chair has been faxed to [facility].  I have called and confirmed this.  They said chair will be sent to patient and she should receive in next few days.

## 2013-09-08 NOTE — Telephone Encounter (Signed)
Pt aware that chair will be delivered

## 2013-09-26 ENCOUNTER — Telehealth: Payer: Self-pay | Admitting: *Deleted

## 2013-09-26 MED ORDER — BENZONATATE 100 MG PO CAPS: 100.0000 mg | ORAL_CAPSULE | Freq: Two times a day (BID) | ORAL | Status: DC | PRN

## 2013-09-26 NOTE — Telephone Encounter (Signed)
Call placed to patient and patient made aware.   Prescription sent to pharmacy.  

## 2013-09-26 NOTE — Telephone Encounter (Signed)
Send in [facility] perrles #20 If not better by end of week, come in for visit

## 2013-09-26 NOTE — Telephone Encounter (Signed)
Received call from patient.   Reports that she has had a cough x2 weeks. States that there is no mucus production, but her cough is keeping her up all night.   Denies any nasal drainage, sore throat or fever.   MD please advise.

## 2013-10-03 ENCOUNTER — Ambulatory Visit
Admission: RE | Admit: 2013-10-03 | Discharge: 2013-10-03 | Disposition: A | Discharge status: Home / Self Care | Payer: Medicare Other | Source: Ambulatory Visit | Attending: Family Medicine | Admitting: Family Medicine

## 2013-10-03 ENCOUNTER — Encounter: Payer: Self-pay | Admitting: Family Medicine

## 2013-10-03 ENCOUNTER — Ambulatory Visit (INDEPENDENT_AMBULATORY_CARE_PROVIDER_SITE_OTHER): Payer: Medicare Other | Admitting: Family Medicine

## 2013-10-03 VITALS — BP 138/72 | HR 58 | Temp 97.1°F | Resp 20 | Ht 61.5 in | Wt 211.0 lb

## 2013-10-03 DIAGNOSIS — R0600 Dyspnea, unspecified: Secondary | ICD-10-CM

## 2013-10-03 DIAGNOSIS — R0989 Other specified symptoms and signs involving the circulatory and respiratory systems: Secondary | ICD-10-CM

## 2013-10-03 DIAGNOSIS — R0609 Other forms of dyspnea: Secondary | ICD-10-CM

## 2013-10-03 NOTE — Progress Notes (Signed)
Subjective:    Patient ID: [name], female    DOB: 07-30-1922, 80 y.o.   MRN: [id number]  HPI Patient has a history of tachybradycardia syndrome. She has a [employer] pacemaker.  Patient reports cough for 2 weeks and increasing dyspnea on exertion. She denies any fevers or chills. She denies any chest pain or angina. She denies any palpitations or syncope. She denies any hemoptysis or weight loss. She does report orthopnea and some mild paroxysmal nocturnal dyspnea. Past Medical History  Diagnosis Date  . Hypertension   . Obesity   . Depression   . Tachy-brady syndrome   . Chronic kidney disease   . Gout    Past Surgical History  Procedure Laterality Date  . Pacemaker insertion     Current Outpatient Prescriptions on File Prior to Visit  Medication Sig Dispense Refill  . acetaminophen (TYLENOL) 500 MG tablet Take 500 mg by mouth 2 (two) times daily.      Marland Kitchen allopurinol (ZYLOPRIM) 100 MG tablet TAKE 2 TABLETS BY MOUTH ONCE DAILY  30 tablet  5  . aspirin 81 MG tablet Take 1 tablet (81 mg total) by mouth daily.  30 tablet  11  . benzonatate (TESSALON) 100 MG capsule Take 1 capsule (100 mg total) by mouth 2 (two) times daily as needed for cough.  20 capsule  0  . carvedilol (COREG) 25 MG tablet Take 1 tablet (25 mg total) by mouth 2 (two) times daily with a meal.  60 tablet  11  . citalopram (CELEXA) 10 MG tablet TAKE 1 TABLET BY MOUTH DAILY  30 tablet  5  . diltiazem (CARDIZEM CD) 180 MG 24 hr capsule TAKE ONE CAPSULE BY MOUTH DAILY  30 capsule  5  . ferrous sulfate 325 (65 FE) MG tablet Take 325 mg by mouth daily with breakfast.      . omeprazole (PRILOSEC) 40 MG capsule Take 1 capsule (40 mg total) by mouth daily. For  stomach  30 capsule  6  . UNABLE TO FIND 1 each by Does not apply route once. Shower chair  1 each  0   No current facility-administered medications on file prior to visit.   No Known Allergies History   Social History  . Marital Status: Widowed    Spouse  Name: N/A    Number of Children: N/A  . Years of Education: N/A   Occupational History  . Not on file.   Social History Main Topics  . Smoking status: Former Research scientist (life sciences)  . Smokeless tobacco: Not on file  . Alcohol Use: Not on file  . Drug Use: Not on file  . Sexual Activity: Not on file   Other Topics Concern  . Not on file   Social History Narrative  . No narrative on file       Review of Systems  All other systems reviewed and are negative.      Objective:   Physical Exam  Vitals reviewed. Constitutional: She appears well-developed and well-nourished.  Neck: Neck supple. No JVD present.  Cardiovascular: Normal rate, regular rhythm and normal heart sounds.   No murmur heard. Pulmonary/Chest: Effort normal. She has no wheezes. She has rales.  Abdominal: Soft. Bowel sounds are normal. She exhibits no distension. There is no tenderness. There is no rebound and no guarding.  Musculoskeletal: She exhibits edema.  Lymphadenopathy:    She has no cervical adenopathy.   patient has faint bibasilar crackles and +1 edema in both legs  No visits with results within 2 Month(s) from this visit. Latest known visit with results is:  Office Visit on 07/26/2013  Component Date Value Ref Range Status  . WBC 07/26/2013 6.0  4.0 - 10.5 K/uL Final  . RBC 07/26/2013 3.53* 3.87 - 5.11 MIL/uL Final  . Hemoglobin 07/26/2013 10.6* 12.0 - 15.0 g/dL Final  . HCT 07/26/2013 33.0* 36.0 - 46.0 % Final  . MCV 07/26/2013 93.5  78.0 - 100.0 fL Final  . MCH 07/26/2013 30.0  26.0 - 34.0 pg Final  . MCHC 07/26/2013 32.1  30.0 - 36.0 g/dL Final  . RDW 07/26/2013 15.0  11.5 - 15.5 % Final  . Platelets 07/26/2013 251  150 - 400 K/uL Final  . Neutrophils Relative % 07/26/2013 49  43 - 77 % Final  . Neutro Abs 07/26/2013 2.9  1.7 - 7.7 K/uL Final  . Lymphocytes Relative 07/26/2013 36  12 - 46 % Final  . Lymphs Abs 07/26/2013 2.2  0.7 - 4.0 K/uL Final  . Monocytes Relative 07/26/2013 10  3 - 12 % Final    . Monocytes Absolute 07/26/2013 0.6  0.1 - 1.0 K/uL Final  . Eosinophils Relative 07/26/2013 5  0 - 5 % Final  . Eosinophils Absolute 07/26/2013 0.3  0.0 - 0.7 K/uL Final  . Basophils Relative 07/26/2013 0  0 - 1 % Final  . Basophils Absolute 07/26/2013 0.0  0.0 - 0.1 K/uL Final  . Smear Review 07/26/2013 Criteria for review not met   Final  . Sodium 07/26/2013 147* 135 - 145 mEq/L Final  . Potassium 07/26/2013 4.7  3.5 - 5.3 mEq/L Final  . Chloride 07/26/2013 105  96 - 112 mEq/L Final  . CO2 07/26/2013 32  19 - 32 mEq/L Final  . Glucose, Bld 07/26/2013 62* 70 - 99 mg/dL Final  . BUN 07/26/2013 19  6 - 23 mg/dL Final  . Creat 07/26/2013 0.98  0.50 - 1.10 mg/dL Final  . Total Bilirubin 07/26/2013 0.4  0.2 - 1.2 mg/dL Final  . Alkaline Phosphatase 07/26/2013 102  39 - 117 U/L Final  . AST 07/26/2013 16  0 - 37 U/L Final  . ALT 07/26/2013 10  0 - 35 U/L Final  . Total Protein 07/26/2013 7.1  6.0 - 8.3 g/dL Final  . Albumin 07/26/2013 3.6  3.5 - 5.2 g/dL Final  . Calcium 07/26/2013 8.9  8.4 - 10.5 mg/dL Final  . GFR, Est African American 07/26/2013 60   Final  . GFR, Est Non African American 07/26/2013 52*  Final   Comment:                            The estimated GFR is a calculation valid for adults (>=19 years old)                          that uses the CKD-EPI algorithm to adjust for age and sex. It is                            not to be used for children, pregnant women, hospitalized patients,                             patients on dialysis, or with rapidly changing kidney function.  According to the NKDEP, eGFR >89 is normal, 60-89 shows mild                          impairment, 30-59 shows moderate impairment, 15-29 shows severe                          impairment and <15 is ESRD.                                      Assessment & Plan:  1. Dyspnea I am concerned the patient may have congestive heart failure. I reviewed her lab work from March  which is within normal limits. I will send the patient for a chest x-ray. If the chest x-ray confirms vascular congestion start the patient on a diuretic for her cough and her dyspnea on exertion and schedule the patient for an echocardiogram of her heart. We may also proceed with cardiology consultation.  I will await the results of her chest x-ray. Otherwise her blood pressures are controlled she is overdue for a fasting lipid panel. I counseled the patient that she needs to consume a low-sodium diet to prevent fluid overload. - DG Chest 2 View; Future

## 2013-10-04 ENCOUNTER — Telehealth: Payer: Self-pay | Admitting: Family Medicine

## 2013-10-04 DIAGNOSIS — J181 Lobar pneumonia, unspecified organism: Principal | ICD-10-CM

## 2013-10-04 DIAGNOSIS — J189 Pneumonia, unspecified organism: Secondary | ICD-10-CM

## 2013-10-04 MED ORDER — LEVOFLOXACIN 500 MG PO TABS: 500.0000 mg | ORAL_TABLET | Freq: Every day | ORAL | Status: DC

## 2013-10-04 NOTE — Telephone Encounter (Signed)
Message copied by Olena Mater on Wed Oct 04, 2013 11:28 AM ------      Message from: Jenna Luo      Created: Tue Oct 03, 2013  1:56 PM       Looks like she may be developing pna in the right base.  Start levaquin 500 mg poqday for 7 days and recheck in 1 week. ------

## 2013-10-04 NOTE — Telephone Encounter (Signed)
Spoke to patient and daughter.  Aware of CXR result and need for antibiotics.  Appt made for next week

## 2013-10-10 ENCOUNTER — Encounter: Payer: Self-pay | Admitting: Family Medicine

## 2013-10-10 ENCOUNTER — Ambulatory Visit (INDEPENDENT_AMBULATORY_CARE_PROVIDER_SITE_OTHER): Payer: Medicare Other | Admitting: Family Medicine

## 2013-10-10 VITALS — BP 132/78 | HR 74 | Temp 97.0°F | Resp 26 | Ht 61.5 in | Wt 213.0 lb

## 2013-10-10 DIAGNOSIS — R0609 Other forms of dyspnea: Secondary | ICD-10-CM

## 2013-10-10 DIAGNOSIS — R0989 Other specified symptoms and signs involving the circulatory and respiratory systems: Secondary | ICD-10-CM

## 2013-10-10 LAB — COMPLETE METABOLIC PANEL WITH GFR
ALT: 8 U/L (ref 0–35)
AST: 12 U/L (ref 0–37)
Albumin: 3.3 g/dL — ABNORMAL LOW (ref 3.5–5.2)
Alkaline Phosphatase: 84 U/L (ref 39–117)
BUN: 25 mg/dL — AB (ref 6–23)
CALCIUM: 8.6 mg/dL (ref 8.4–10.5)
CHLORIDE: 102 meq/L (ref 96–112)
CO2: 30 meq/L (ref 19–32)
CREATININE: 1.44 mg/dL — AB (ref 0.50–1.10)
GFR, EST AFRICAN AMERICAN: 37 mL/min — AB
GFR, Est Non African American: 32 mL/min — ABNORMAL LOW
Glucose, Bld: 89 mg/dL (ref 70–99)
Potassium: 4.1 mEq/L (ref 3.5–5.3)
Sodium: 138 mEq/L (ref 135–145)
Total Bilirubin: 0.5 mg/dL (ref 0.2–1.2)
Total Protein: 6.8 g/dL (ref 6.0–8.3)

## 2013-10-10 LAB — CBC WITH DIFFERENTIAL/PLATELET
Basophils Absolute: 0 10*3/uL (ref 0.0–0.1)
Basophils Relative: 0 % (ref 0–1)
EOS ABS: 0.2 10*3/uL (ref 0.0–0.7)
EOS PCT: 3 % (ref 0–5)
HCT: 32.4 % — ABNORMAL LOW (ref 36.0–46.0)
HEMOGLOBIN: 10.5 g/dL — AB (ref 12.0–15.0)
LYMPHS ABS: 2.1 10*3/uL (ref 0.7–4.0)
Lymphocytes Relative: 38 % (ref 12–46)
MCH: 29.7 pg (ref 26.0–34.0)
MCHC: 32.4 g/dL (ref 30.0–36.0)
MCV: 91.5 fL (ref 78.0–100.0)
MONO ABS: 0.6 10*3/uL (ref 0.1–1.0)
MONOS PCT: 11 % (ref 3–12)
Neutro Abs: 2.6 10*3/uL (ref 1.7–7.7)
Neutrophils Relative %: 48 % (ref 43–77)
Platelets: 181 10*3/uL (ref 150–400)
RBC: 3.54 MIL/uL — AB (ref 3.87–5.11)
RDW: 14.8 % (ref 11.5–15.5)
WBC: 5.5 10*3/uL (ref 4.0–10.5)

## 2013-10-10 NOTE — Progress Notes (Signed)
Subjective:    Patient ID: [name], female    DOB: [birth year], 80 y.o.   MRN: [id number]  HPI 10/03/13 Patient has a history of tachybradycardia syndrome. She has a [employer] pacemaker.  Patient reports cough for 2 weeks and increasing dyspnea on exertion. She denies any fevers or chills. She denies any chest pain or angina. She denies any palpitations or syncope. She denies any hemoptysis or weight loss. She does report orthopnea and some mild paroxysmal nocturnal dyspnea.  At that time, my plan was: 1. Dyspnea I am concerned the patient may have congestive heart failure. I reviewed her lab work from March which is within normal limits. I will send the patient for a chest x-ray. If the chest x-ray confirms vascular congestion start the patient on a diuretic for her cough and her dyspnea on exertion and schedule the patient for an echocardiogram of her heart. We may also proceed with cardiology consultation.  I will await the results of her chest x-ray. Otherwise her blood pressures are controlled she is overdue for a fasting lipid panel. I counseled the patient that she needs to consume a low-sodium diet to prevent fluid overload. - DG Chest 2 View; Future  10/10/13 Chest x-ray actually revealed patchy right lower lobe pneumonia. Patient was started on Levaquin 500 mg by mouth daily for 7 days. She is here today for recheck. States that she is feeling better. Her cough is improving. She denies any fever chest pain or pleurisy. She denies any hemoptysis. She finished her antibiotics this morning. Largely she still complains of dyspnea on exertion. She also complains of generalized shortness of breath. Her last echocardiogram of her heart was performed in 2013 at that time she was found to have an ejection fraction of 55-60%. The last blood was in March and at that time she was found to be mildly anemic. Her typical hemoglobin range is 11. It was down to 10 in March. She denies any melena or  hematochezia.  Past Medical History  Diagnosis Date  . Hypertension   . Obesity   . Depression   . Tachy-brady syndrome   . Chronic kidney disease   . Gout    Past Surgical History  Procedure Laterality Date  . Pacemaker insertion     Current Outpatient Prescriptions on File Prior to Visit  Medication Sig Dispense Refill  . acetaminophen (TYLENOL) 500 MG tablet Take 500 mg by mouth 2 (two) times daily.      Marland Kitchen allopurinol (ZYLOPRIM) 100 MG tablet TAKE 2 TABLETS BY MOUTH ONCE DAILY  30 tablet  5  . aspirin 81 MG tablet Take 1 tablet (81 mg total) by mouth daily.  30 tablet  11  . benzonatate (TESSALON) 100 MG capsule Take 1 capsule (100 mg total) by mouth 2 (two) times daily as needed for cough.  20 capsule  0  . carvedilol (COREG) 25 MG tablet Take 1 tablet (25 mg total) by mouth 2 (two) times daily with a meal.  60 tablet  11  . citalopram (CELEXA) 10 MG tablet TAKE 1 TABLET BY MOUTH DAILY  30 tablet  5  . diltiazem (CARDIZEM CD) 180 MG 24 hr capsule TAKE ONE CAPSULE BY MOUTH DAILY  30 capsule  5  . ferrous sulfate 325 (65 FE) MG tablet Take 325 mg by mouth daily with breakfast.      . levofloxacin (LEVAQUIN) 500 MG tablet Take 1 tablet (500 mg total) by mouth daily.  7  tablet  0  . omeprazole (PRILOSEC) 40 MG capsule Take 1 capsule (40 mg total) by mouth daily. For  stomach  30 capsule  6  . UNABLE TO FIND 1 each by Does not apply route once. Shower chair  1 each  0   No current facility-administered medications on file prior to visit.   No Known Allergies History   Social History  . Marital Status: Widowed    Spouse Name: N/A    Number of Children: N/A  . Years of Education: N/A   Occupational History  . Not on file.   Social History Main Topics  . Smoking status: Former Research scientist (life sciences)  . Smokeless tobacco: Not on file  . Alcohol Use: Not on file  . Drug Use: Not on file  . Sexual Activity: Not on file   Other Topics Concern  . Not on file   Social History Narrative  .  No narrative on file       Review of Systems  All other systems reviewed and are negative.      Objective:   Physical Exam  Vitals reviewed. Constitutional: She appears well-developed and well-nourished.  Neck: Neck supple. No JVD present.  Cardiovascular: Normal rate, regular rhythm and normal heart sounds.   No murmur heard. Pulmonary/Chest: Effort normal. She has no wheezes. She has no rales.  Abdominal: Soft. Bowel sounds are normal. She exhibits no distension. There is no tenderness. There is no rebound and no guarding.  Musculoskeletal: She exhibits edema.  Lymphadenopathy:    She has no cervical adenopathy.   +1 edema in both legs No visits with results within 2 Month(s) from this visit. Latest known visit with results is:  Office Visit on 07/26/2013  Component Date Value Ref Range Status  . WBC 07/26/2013 6.0  4.0 - 10.5 K/uL Final  . RBC 07/26/2013 3.53* 3.87 - 5.11 MIL/uL Final  . Hemoglobin 07/26/2013 10.6* 12.0 - 15.0 g/dL Final  . HCT 07/26/2013 33.0* 36.0 - 46.0 % Final  . MCV 07/26/2013 93.5  78.0 - 100.0 fL Final  . MCH 07/26/2013 30.0  26.0 - 34.0 pg Final  . MCHC 07/26/2013 32.1  30.0 - 36.0 g/dL Final  . RDW 07/26/2013 15.0  11.5 - 15.5 % Final  . Platelets 07/26/2013 251  150 - 400 K/uL Final  . Neutrophils Relative % 07/26/2013 49  43 - 77 % Final  . Neutro Abs 07/26/2013 2.9  1.7 - 7.7 K/uL Final  . Lymphocytes Relative 07/26/2013 36  12 - 46 % Final  . Lymphs Abs 07/26/2013 2.2  0.7 - 4.0 K/uL Final  . Monocytes Relative 07/26/2013 10  3 - 12 % Final  . Monocytes Absolute 07/26/2013 0.6  0.1 - 1.0 K/uL Final  . Eosinophils Relative 07/26/2013 5  0 - 5 % Final  . Eosinophils Absolute 07/26/2013 0.3  0.0 - 0.7 K/uL Final  . Basophils Relative 07/26/2013 0  0 - 1 % Final  . Basophils Absolute 07/26/2013 0.0  0.0 - 0.1 K/uL Final  . Smear Review 07/26/2013 Criteria for review not met   Final  . Sodium 07/26/2013 147* 135 - 145 mEq/L Final  .  Potassium 07/26/2013 4.7  3.5 - 5.3 mEq/L Final  . Chloride 07/26/2013 105  96 - 112 mEq/L Final  . CO2 07/26/2013 32  19 - 32 mEq/L Final  . Glucose, Bld 07/26/2013 62* 70 - 99 mg/dL Final  . BUN 07/26/2013 19  6 - 23 mg/dL Final  .  Creat 07/26/2013 0.98  0.50 - 1.10 mg/dL Final  . Total Bilirubin 07/26/2013 0.4  0.2 - 1.2 mg/dL Final  . Alkaline Phosphatase 07/26/2013 102  39 - 117 U/L Final  . AST 07/26/2013 16  0 - 37 U/L Final  . ALT 07/26/2013 10  0 - 35 U/L Final  . Total Protein 07/26/2013 7.1  6.0 - 8.3 g/dL Final  . Albumin 07/26/2013 3.6  3.5 - 5.2 g/dL Final  . Calcium 07/26/2013 8.9  8.4 - 10.5 mg/dL Final  . GFR, Est African American 07/26/2013 60   Final  . GFR, Est Non African American 07/26/2013 52*  Final   Comment:                            The estimated GFR is a calculation valid for adults (>=79 years old)                          that uses the CKD-EPI algorithm to adjust for age and sex. It is                            not to be used for children, pregnant women, hospitalized patients,                             patients on dialysis, or with rapidly changing kidney function.                          According to the NKDEP, eGFR >89 is normal, 60-89 shows mild                          impairment, 30-59 shows moderate impairment, 15-29 shows severe                          impairment and <15 is ESRD.                                      Assessment & Plan:  1. Dyspnea on exertion Crackles have disappeared from the patient's lungs. She still has +1 edema in both legs. The dyspnea exertion could certainly be due to her pneumonia. Given her age it may take her some time to recover. However I am also concerned about possible congestive heart failure. Therefore, I will check an echocardiogram along with some lab work. I will also repeat a CBC to ensure that her anemia is not worsening. If her anemia is worsening this may also contribute to her shortness of breath and  dyspnea on exertion.  From a clinical standpoint, her pneumonia has resolved. - CBC with Differential - Brain natriuretic peptide - COMPLETE METABOLIC PANEL WITH GFR - 2D Echocardiogram without contrast; Future

## 2013-10-11 LAB — BRAIN NATRIURETIC PEPTIDE: Brain Natriuretic Peptide: 655.4 pg/mL — ABNORMAL HIGH (ref 0.0–100.0)

## 2013-10-12 ENCOUNTER — Encounter: Payer: Self-pay | Admitting: *Deleted

## 2013-10-12 NOTE — Progress Notes (Signed)
This encounter was created in error - please disregard.

## 2013-10-17 ENCOUNTER — Telehealth: Payer: Self-pay | Admitting: Family Medicine

## 2013-10-17 ENCOUNTER — Emergency Department (HOSPITAL_COMMUNITY): Payer: Medicare Other

## 2013-10-17 ENCOUNTER — Encounter (HOSPITAL_COMMUNITY): Payer: Self-pay | Admitting: Emergency Medicine

## 2013-10-17 ENCOUNTER — Emergency Department (HOSPITAL_COMMUNITY)
Admission: EM | Admit: 2013-10-17 | Discharge: 2013-10-17 | Disposition: A | Discharge status: Home / Self Care | Payer: Medicare Other | Attending: Emergency Medicine | Admitting: Emergency Medicine

## 2013-10-17 DIAGNOSIS — J159 Unspecified bacterial pneumonia: Secondary | ICD-10-CM | POA: Insufficient documentation

## 2013-10-17 DIAGNOSIS — Z950 Presence of cardiac pacemaker: Secondary | ICD-10-CM | POA: Insufficient documentation

## 2013-10-17 DIAGNOSIS — Z7982 Long term (current) use of aspirin: Secondary | ICD-10-CM | POA: Insufficient documentation

## 2013-10-17 DIAGNOSIS — I129 Hypertensive chronic kidney disease with stage 1 through stage 4 chronic kidney disease, or unspecified chronic kidney disease: Secondary | ICD-10-CM | POA: Insufficient documentation

## 2013-10-17 DIAGNOSIS — M109 Gout, unspecified: Secondary | ICD-10-CM | POA: Insufficient documentation

## 2013-10-17 DIAGNOSIS — R609 Edema, unspecified: Secondary | ICD-10-CM | POA: Insufficient documentation

## 2013-10-17 DIAGNOSIS — E669 Obesity, unspecified: Secondary | ICD-10-CM | POA: Insufficient documentation

## 2013-10-17 DIAGNOSIS — F3289 Other specified depressive episodes: Secondary | ICD-10-CM | POA: Insufficient documentation

## 2013-10-17 DIAGNOSIS — F329 Major depressive disorder, single episode, unspecified: Secondary | ICD-10-CM | POA: Insufficient documentation

## 2013-10-17 DIAGNOSIS — J189 Pneumonia, unspecified organism: Secondary | ICD-10-CM

## 2013-10-17 DIAGNOSIS — Z792 Long term (current) use of antibiotics: Secondary | ICD-10-CM | POA: Insufficient documentation

## 2013-10-17 DIAGNOSIS — Z87891 Personal history of nicotine dependence: Secondary | ICD-10-CM | POA: Insufficient documentation

## 2013-10-17 DIAGNOSIS — Z79899 Other long term (current) drug therapy: Secondary | ICD-10-CM | POA: Insufficient documentation

## 2013-10-17 DIAGNOSIS — N189 Chronic kidney disease, unspecified: Secondary | ICD-10-CM | POA: Insufficient documentation

## 2013-10-17 DIAGNOSIS — I495 Sick sinus syndrome: Secondary | ICD-10-CM | POA: Insufficient documentation

## 2013-10-17 LAB — CBC WITH DIFFERENTIAL/PLATELET
BASOS ABS: 0 10*3/uL (ref 0.0–0.1)
BASOS PCT: 0 % (ref 0–1)
EOS ABS: 0.3 10*3/uL (ref 0.0–0.7)
EOS PCT: 4 % (ref 0–5)
HEMATOCRIT: 33.9 % — AB (ref 36.0–46.0)
Hemoglobin: 11.1 g/dL — ABNORMAL LOW (ref 12.0–15.0)
Lymphocytes Relative: 35 % (ref 12–46)
Lymphs Abs: 2.3 10*3/uL (ref 0.7–4.0)
MCH: 30.6 pg (ref 26.0–34.0)
MCHC: 32.7 g/dL (ref 30.0–36.0)
MCV: 93.4 fL (ref 78.0–100.0)
MONO ABS: 0.6 10*3/uL (ref 0.1–1.0)
Monocytes Relative: 9 % (ref 3–12)
Neutro Abs: 3.5 10*3/uL (ref 1.7–7.7)
Neutrophils Relative %: 52 % (ref 43–77)
PLATELETS: 167 10*3/uL (ref 150–400)
RBC: 3.63 MIL/uL — ABNORMAL LOW (ref 3.87–5.11)
RDW: 13.8 % (ref 11.5–15.5)
WBC: 6.7 10*3/uL (ref 4.0–10.5)

## 2013-10-17 LAB — COMPREHENSIVE METABOLIC PANEL
ALBUMIN: 3.1 g/dL — AB (ref 3.5–5.2)
ALT: 7 U/L (ref 0–35)
AST: 15 U/L (ref 0–37)
Alkaline Phosphatase: 99 U/L (ref 39–117)
BUN: 18 mg/dL (ref 6–23)
CALCIUM: 8.9 mg/dL (ref 8.4–10.5)
CO2: 30 mEq/L (ref 19–32)
CREATININE: 1.07 mg/dL (ref 0.50–1.10)
Chloride: 103 mEq/L (ref 96–112)
GFR calc Af Amer: 52 mL/min — ABNORMAL LOW (ref >90)
GFR, EST NON AFRICAN AMERICAN: 45 mL/min — AB (ref >90)
Glucose, Bld: 91 mg/dL (ref 70–99)
Potassium: 4.4 mEq/L (ref 3.7–5.3)
SODIUM: 143 meq/L (ref 137–147)
TOTAL PROTEIN: 7.3 g/dL (ref 6.0–8.3)
Total Bilirubin: 0.4 mg/dL (ref 0.3–1.2)

## 2013-10-17 LAB — URINALYSIS, ROUTINE W REFLEX MICROSCOPIC
Bilirubin Urine: NEGATIVE
Glucose, UA: NEGATIVE mg/dL
KETONES UR: NEGATIVE mg/dL
NITRITE: NEGATIVE
PH: 6.5 (ref 5.0–8.0)
Protein, ur: 100 mg/dL — AB
SPECIFIC GRAVITY, URINE: 1.009 (ref 1.005–1.030)
Urobilinogen, UA: 0.2 mg/dL (ref 0.0–1.0)

## 2013-10-17 LAB — TROPONIN I: Troponin I: <0.3 ng/mL (ref <0.30)

## 2013-10-17 LAB — URINE MICROSCOPIC-ADD ON

## 2013-10-17 LAB — PRO B NATRIURETIC PEPTIDE: Pro B Natriuretic peptide (BNP): 6565 pg/mL — ABNORMAL HIGH (ref 0–450)

## 2013-10-17 MED ORDER — FUROSEMIDE 20 MG PO TABS: 20.0000 mg | ORAL_TABLET | Freq: Every day | ORAL | Status: DC

## 2013-10-17 MED ORDER — LEVOFLOXACIN 750 MG PO TABS: 750.0000 mg | ORAL_TABLET | Freq: Every day | ORAL | Status: DC

## 2013-10-17 MED ORDER — FUROSEMIDE 10 MG/ML IJ SOLN
40.0000 mg | Freq: Once | INTRAMUSCULAR | Status: AC
  Administered 2013-10-17: 40 mg via INTRAVENOUS
  Filled 2013-10-17: qty 4

## 2013-10-17 NOTE — ED Provider Notes (Addendum)
CSN: [id number]     Arrival date & time 10/17/13  1414 History   First MD Initiated Contact with Patient 10/17/13 1458     Chief Complaint  Patient presents with  . Leg Swelling     (Consider location/radiation/quality/duration/timing/severity/associated sxs/prior Treatment) HPI Comments: Patient presented to the ER for evaluation of lower extremity swelling. Symptoms began approximately a week ago. It worsened over the weekend, family reports that she went off of her low sodium diet this holiday weekend. Patient has been expressing shortness of breath which worsens with exertion. She denies chest pain. Patient reportedly was previously on Lasix, but was taken off of it by her doctors several months ago. She reports a 2 pound weight gain in the last few days.   Past Medical History  Diagnosis Date  . Hypertension   . Obesity   . Depression   . Tachy-brady syndrome   . Chronic kidney disease   . Gout    Past Surgical History  Procedure Laterality Date  . Pacemaker insertion     History reviewed. No pertinent family history. History  Substance Use Topics  . Smoking status: Former Research scientist (life sciences)  . Smokeless tobacco: Not on file  . Alcohol Use: Not on file   OB History   Grav Para Term Preterm Abortions TAB SAB Ect Mult Living                 Review of Systems  Respiratory: Positive for shortness of breath.   Cardiovascular: Positive for leg swelling. Negative for chest pain.  All other systems reviewed and are negative.     Allergies  Review of patient's allergies indicates no known allergies.  Home Medications   Prior to Admission medications   Medication Sig Start Date End Date Taking? Authorizing Provider  acetaminophen (TYLENOL) 500 MG tablet Take 500 mg by mouth 2 (two) times daily.    Historical Provider, MD  allopurinol (ZYLOPRIM) 100 MG tablet TAKE 2 TABLETS BY MOUTH ONCE DAILY 06/09/13   [name], MD  aspirin 81 MG tablet Take 1 tablet (81 mg total) by  mouth daily. 04/27/13   Deboraha Sprang, MD  benzonatate (TESSALON) 100 MG capsule Take 1 capsule (100 mg total) by mouth 2 (two) times daily as needed for cough. 09/26/13   Alycia Rossetti, MD  carvedilol (COREG) 25 MG tablet Take 1 tablet (25 mg total) by mouth 2 (two) times daily with a meal. 04/27/13   Deboraha Sprang, MD  citalopram (CELEXA) 10 MG tablet TAKE 1 TABLET BY MOUTH DAILY 06/09/13   [name], MD  diltiazem (CARDIZEM CD) 180 MG 24 hr capsule TAKE ONE CAPSULE BY MOUTH DAILY 06/09/13   [name], MD  ferrous sulfate 325 (65 FE) MG tablet Take 325 mg by mouth daily with breakfast.    Historical Provider, MD  levofloxacin (LEVAQUIN) 500 MG tablet Take 1 tablet (500 mg total) by mouth daily. 10/04/13   [name], MD  omeprazole (PRILOSEC) 40 MG capsule Take 1 capsule (40 mg total) by mouth daily. For  stomach 07/26/13   Alycia Rossetti, MD  UNABLE TO FIND 1 each by Does not apply route once. Shower chair 09/07/13   [name], MD   BP 160/84  Pulse 78  Temp(Src) 98 F (36.7 C) (Oral)  Resp 23  SpO2 91% Physical Exam  Constitutional: She is oriented to person, place, and time. She appears well-developed and well-nourished. No distress.  HENT:  Head: Normocephalic and atraumatic.  Right Ear: Hearing normal.  Left Ear: Hearing normal.  Nose: Nose normal.  Mouth/Throat: Oropharynx is clear and moist and mucous membranes are normal.  Eyes: Conjunctivae and EOM are normal. Pupils are equal, round, and reactive to light.  Neck: Normal range of motion. Neck supple.  Cardiovascular: Regular rhythm, S1 normal and S2 normal.  Exam reveals no gallop and no friction rub.   No murmur heard. Pulmonary/Chest: Effort normal. No respiratory distress. She has decreased breath sounds. She exhibits no tenderness.  Abdominal: Soft. Normal appearance and bowel sounds are normal. There is no hepatosplenomegaly. There is no tenderness. There is no rebound, no guarding, no tenderness  at McBurney's point and negative Murphy's sign. No hernia.  Musculoskeletal: Normal range of motion. She exhibits edema.  Neurological: She is alert and oriented to person, place, and time. She has normal strength. No cranial nerve deficit or sensory deficit. Coordination normal. GCS eye subscore is 4. GCS verbal subscore is 5. GCS motor subscore is 6.  Skin: Skin is warm, dry and intact. No rash noted. No cyanosis.  Psychiatric: She has a normal mood and affect. Her speech is normal and behavior is normal. Thought content normal.    ED Course  Procedures (including critical care time) Labs Review Labs Reviewed  CBC WITH DIFFERENTIAL - Abnormal; Notable for the following:    RBC 3.63 (*)    Hemoglobin 11.1 (*)    HCT 33.9 (*)    All other components within normal limits  COMPREHENSIVE METABOLIC PANEL - Abnormal; Notable for the following:    Albumin 3.1 (*)    GFR calc non Af Amer 45 (*)    GFR calc Af Amer 52 (*)    All other components within normal limits  PRO B NATRIURETIC PEPTIDE - Abnormal; Notable for the following:    Pro B Natriuretic peptide (BNP) 6565.0 (*)    All other components within normal limits  URINALYSIS, ROUTINE W REFLEX MICROSCOPIC - Abnormal; Notable for the following:    Hgb urine dipstick TRACE (*)    Protein, ur 100 (*)    Leukocytes, UA SMALL (*)    All other components within normal limits  URINE MICROSCOPIC-ADD ON - Abnormal; Notable for the following:    Squamous Epithelial / LPF FEW (*)    Bacteria, UA FEW (*)    All other components within normal limits  TROPONIN I    Imaging Review Dg Chest 2 View  10/17/2013   CLINICAL DATA:  Short of breath, fluid retention  EXAM: CHEST  2 VIEW  COMPARISON:  10/03/2013  FINDINGS: Progression of airspace disease in the lingula which may represent pneumonia. Minimal left effusion.  Cardiac enlargement without heart failure. Pacemaker unchanged. Right lung clear.  IMPRESSION: Mild progression of lingular airspace  disease, possible pneumonia.   Electronically Signed   By: Franchot Gallo M.D.   On: 10/17/2013 16:14     EKG Interpretation   Date/Time:  Tuesday Oct 17 2013 15:11:34 EDT Ventricular Rate:  80 PR Interval:  208 QRS Duration: 115 QT Interval:  406 QTC Calculation: 468 R Axis:   -69 Text Interpretation:  Age not entered, assumed to be  80 years old for  purpose of ECG interpretation Sinus rhythm Ventricular bigeminy Borderline  prolonged PR interval non specific ivcd Inferior infarct, old Consider  anterior infarct No significant change since last tracing Confirmed by  [facility]  MD, CHRISTOPHER [phone]) on 10/17/2013 3:24:19 PM  MDM   Final diagnoses:  Peripheral edema  CAP (community acquired pneumonia)   possible CHF  Patient presents to the ER for evaluation of decrease fine her legs with slight difficulty breathing. Patient has a history of being treated with diuretics, but these were stopped several months ago, according to family. She does have pitting edema of the lower extremities with clear lung fields on auscultation. Patient administered Lasix IV here and has proceeded to diuresis. She is ambulating without difficulty in the hallway. No shortness of breath hypoxia with ambulation. She does have elevation of her BNP. From the records it is not clear if her previous treatment with diuretics was for peripheral edema or if she does have pre-existing diagnosis of congestive heart failure. I believe she is appropriate for outpatient therapy with diuretics and prompt followup with primary doctor.  Chest x-ray did not show evidence of overt congestive heart failure. She does, however, have lingular airspace disease that is possibly pneumonia. As there is some slight effusion associated with this, I suspect that this is early fluid and not infection, but will require treatment with antibiotics as well as diuretics. Patient does not have any infectious type symptoms.    Orpah Greek, MD 10/17/13 [place], MD 10/17/13 [phone]

## 2013-10-17 NOTE — ED Notes (Signed)
Per pt sts BLE swelling x 1 week. Denies pain.

## 2013-10-17 NOTE — ED Notes (Signed)
Pt ambulated in halls. Tolerated well with assistance. No increased SOB

## 2013-10-17 NOTE — Discharge Instructions (Signed)
Pneumonia, Adult °Pneumonia is an infection of the lungs.  °CAUSES °Pneumonia may be caused by bacteria or a virus. Usually, these infections are caused by breathing infectious particles into the lungs (respiratory tract). °SYMPTOMS  °· Cough. °· Fever. °· Chest pain. °· Increased rate of breathing. °· Wheezing. °· Mucus production. °DIAGNOSIS  °If you have the common symptoms of pneumonia, your caregiver will typically confirm the diagnosis with a chest X-ray. The X-ray will show an abnormality in the lung (pulmonary infiltrate) if you have pneumonia. Other tests of your blood, urine, or sputum may be done to find the specific cause of your pneumonia. Your caregiver may also do tests (blood gases or pulse oximetry) to see how well your lungs are working. °TREATMENT  °Some forms of pneumonia may be spread to other people when you cough or sneeze. You may be asked to wear a mask before and during your exam. Pneumonia that is caused by bacteria is treated with antibiotic medicine. Pneumonia that is caused by the influenza virus may be treated with an antiviral medicine. Most other viral infections must run their course. These infections will not respond to antibiotics.  °PREVENTION °A pneumococcal shot (vaccine) is available to prevent a common bacterial cause of pneumonia. This is usually suggested for: °· People over 65 years old. °· Patients on chemotherapy. °· People with chronic lung problems, such as bronchitis or emphysema. °· People with immune system problems. °If you are over 65 or have a high risk condition, you may receive the pneumococcal vaccine if you have not received it before. In some countries, a routine influenza vaccine is also recommended. This vaccine can help prevent some cases of pneumonia. You may be offered the influenza vaccine as part of your care. °If you smoke, it is time to quit. You may receive instructions on how to stop smoking. Your caregiver can provide medicines and counseling to  help you quit. °HOME CARE INSTRUCTIONS  °· Cough suppressants may be used if you are losing too much rest. However, coughing protects you by clearing your lungs. You should avoid using cough suppressants if you can. °· Your caregiver may have prescribed medicine if he or she thinks your pneumonia is caused by a bacteria or influenza. Finish your medicine even if you start to feel better. °· Your caregiver may also prescribe an expectorant. This loosens the mucus to be coughed up. °· Only take over-the-counter or prescription medicines for pain, discomfort, or fever as directed by your caregiver. °· Do not smoke. Smoking is a common cause of bronchitis and can contribute to pneumonia. If you are a smoker and continue to smoke, your cough may last several weeks after your pneumonia has cleared. °· A cold steam vaporizer or humidifier in your room or home may help loosen mucus. °· Coughing is often worse at night. Sleeping in a semi-upright position in a recliner or using a couple pillows under your head will help with this. °· Get rest as you feel it is needed. Your body will usually let you know when you need to rest. °SEEK IMMEDIATE MEDICAL CARE IF:  °· Your illness becomes worse. This is especially true if you are elderly or weakened from any other disease. °· You cannot control your cough with suppressants and are losing sleep. °· You begin coughing up blood. °· You develop pain which is getting worse or is uncontrolled with medicines. °· You have a fever. °· Any of the symptoms which initially brought you in for treatment   are getting worse rather than better.  You develop shortness of breath or chest pain. MAKE SURE YOU:   Understand these instructions.  Will watch your condition.  Will get help right away if you are not doing well or get worse. Document Released: 05/11/2005 Document Revised: 08/03/2011 Document Reviewed: 07/31/2010 [facility] Patient Information 2014 [place], [place].  Peripheral  Edema You have swelling in your legs (peripheral edema). This swelling is due to excess accumulation of salt and water in your body. Edema may be a sign of heart, kidney or liver disease, or a side effect of a medication. It may also be due to problems in the leg veins. Elevating your legs and using special support stockings may be very helpful, if the cause of the swelling is due to poor venous circulation. Avoid long periods of standing, whatever the cause. Treatment of edema depends on identifying the cause. Chips, pretzels, pickles and other salty foods should be avoided. Restricting salt in your diet is almost always needed. Water pills (diuretics) are often used to remove the excess salt and water from your body via urine. These medicines prevent the kidney from reabsorbing sodium. This increases urine flow. Diuretic treatment may also result in lowering of potassium levels in your body. Potassium supplements may be needed if you have to use diuretics daily. Daily weights can help you keep track of your progress in clearing your edema. You should call your caregiver for follow up care as recommended. SEEK IMMEDIATE MEDICAL CARE IF:   You have increased swelling, pain, redness, or heat in your legs.  You develop shortness of breath, especially when lying down.  You develop chest or abdominal pain, weakness, or fainting.  You have a fever. Document Released: 06/18/2004 Document Revised: 08/03/2011 Document Reviewed: 05/29/2009 [facility] Patient Information 2014 [place].

## 2013-10-17 NOTE — Telephone Encounter (Signed)
Calling due to increased edema and shortness of breath.  Daughter states it is very bad.  Never heard results of lab work. Ultrasound not scheduled until next week.  Given recent history, have advised family to call 911 and have patient taken to the ED.  Family agrees.

## 2013-10-17 NOTE — ED Notes (Signed)
Patient transported to X-ray 

## 2013-10-17 NOTE — ED Notes (Signed)
Pt states she has been progressively getting more SOB along with the leg swelling. Pt has PPM

## 2013-10-23 ENCOUNTER — Encounter: Payer: Self-pay | Admitting: Family Medicine

## 2013-10-23 ENCOUNTER — Ambulatory Visit (INDEPENDENT_AMBULATORY_CARE_PROVIDER_SITE_OTHER): Payer: Medicare Other | Admitting: Family Medicine

## 2013-10-23 VITALS — BP 126/74 | HR 70 | Temp 97.3°F | Resp 18 | Ht 61.5 in | Wt 208.0 lb

## 2013-10-23 DIAGNOSIS — R0602 Shortness of breath: Secondary | ICD-10-CM

## 2013-10-23 MED ORDER — FUROSEMIDE 20 MG PO TABS: 20.0000 mg | ORAL_TABLET | Freq: Every day | ORAL | Status: DC

## 2013-10-23 NOTE — Progress Notes (Signed)
Subjective:    Patient ID: [name], female    DOB: 08/14/1922, 80 y.o.   MRN: [id number]  HPI Was recently seen in the ER for SOB.  I have copied relevant portions of her discharge summary below: Patient presents with   .  Leg Swelling   (Consider location/radiation/quality/duration/timing/severity/associated sxs/prior  Treatment)  HPI Comments: Patient presented to the ER for evaluation of lower extremity swelling. Symptoms began approximately a week ago. It worsened over the weekend, family reports that she went off of her low sodium diet this holiday weekend. Patient has been expressing shortness of breath which worsens with exertion. She denies chest pain. Patient reportedly was previously on Lasix, but was taken off of it by her doctors several months ago. She reports a 2 pound weight gain in the last few days.  Past Medical History   Diagnosis  Date   .  Hypertension    .  Obesity    .  Depression    .  Tachy-brady syndrome    .  Chronic kidney disease    .  Gout     Past Surgical History   Procedure  Laterality  Date   .  Pacemaker insertion     History reviewed. No pertinent family history.  History   Substance Use Topics   .  Smoking status:  Former Research scientist (life sciences)   .  Smokeless tobacco:  Not on file   .  Alcohol Use:  Not on file    OB History    Grav  Para  Term  Preterm  Abortions  TAB  SAB  Ect  Mult  Living                 Review of Systems  Respiratory: Positive for shortness of breath.  Cardiovascular: Positive for leg swelling. Negative for chest pain.  All other systems reviewed and are negative.  Allergies   Review of patient's allergies indicates no known allergies.  Home Medications    Prior to Admission medications   Medication  Sig  Start Date  End Date  Taking?  Authorizing Provider   acetaminophen (TYLENOL) 500 MG tablet  Take 500 mg by mouth 2 (two) times daily.     Historical Provider, MD   allopurinol (ZYLOPRIM) 100 MG tablet  TAKE 2 TABLETS  BY MOUTH ONCE DAILY  06/09/13    [name], MD   aspirin 81 MG tablet  Take 1 tablet (81 mg total) by mouth daily.  04/27/13    Deboraha Sprang, MD   benzonatate (TESSALON) 100 MG capsule  Take 1 capsule (100 mg total) by mouth 2 (two) times daily as needed for cough.  09/26/13    Alycia Rossetti, MD   carvedilol (COREG) 25 MG tablet  Take 1 tablet (25 mg total) by mouth 2 (two) times daily with a meal.  04/27/13    Deboraha Sprang, MD   citalopram (CELEXA) 10 MG tablet  TAKE 1 TABLET BY MOUTH DAILY  06/09/13    [name], MD   diltiazem (CARDIZEM CD) 180 MG 24 hr capsule  TAKE ONE CAPSULE BY MOUTH DAILY  06/09/13    [name], MD   ferrous sulfate 325 (65 FE) MG tablet  Take 325 mg by mouth daily with breakfast.     Historical Provider, MD   levofloxacin (LEVAQUIN) 500 MG tablet  Take 1 tablet (500 mg total) by mouth daily.  10/04/13  [name], MD   omeprazole (PRILOSEC) 40 MG capsule  Take 1 capsule (40 mg total) by mouth daily. For stomach  07/26/13    Alycia Rossetti, MD   UNABLE TO FIND  1 each by Does not apply route once. Shower chair  09/07/13    [name], MD   BP 160/84  Pulse 78  Temp(Src) 98 F (36.7 C) (Oral)  Resp 23  SpO2 91%  Physical Exam  Constitutional: She is oriented to person, place, and time. She appears well-developed and well-nourished. No distress.  HENT:  Head: Normocephalic and atraumatic.  Right Ear: Hearing normal.  Left Ear: Hearing normal.  Nose: Nose normal.  Mouth/Throat: Oropharynx is clear and moist and mucous membranes are normal.  Eyes: Conjunctivae and EOM are normal. Pupils are equal, round, and reactive to light.  Neck: Normal range of motion. Neck supple.  Cardiovascular: Regular rhythm, S1 normal and S2 normal. Exam reveals no gallop and no friction rub.  No murmur heard.  Pulmonary/Chest: Effort normal. No respiratory distress. She has decreased breath sounds. She exhibits no tenderness.  Abdominal: Soft. Normal  appearance and bowel sounds are normal. There is no hepatosplenomegaly. There is no tenderness. There is no rebound, no guarding, no tenderness at McBurney's point and negative Murphy's sign. No hernia.  Musculoskeletal: Normal range of motion. She exhibits edema.  Neurological: She is alert and oriented to person, place, and time. She has normal strength. No cranial nerve deficit or sensory deficit. Coordination normal. GCS eye subscore is 4. GCS verbal subscore is 5. GCS motor subscore is 6.  Skin: Skin is warm, dry and intact. No rash noted. No cyanosis.  Psychiatric: She has a normal mood and affect. Her speech is normal and behavior is normal. Thought content normal.  ED Course   Procedures (including critical care time)  Labs Review  Labs Reviewed   CBC WITH DIFFERENTIAL - Abnormal; Notable for the following:    RBC  3.63 (*)     Hemoglobin  11.1 (*)     HCT  33.9 (*)     All other components within normal limits   COMPREHENSIVE METABOLIC PANEL - Abnormal; Notable for the following:    Albumin  3.1 (*)     GFR calc non Af Amer  45 (*)     GFR calc Af Amer  52 (*)     All other components within normal limits   PRO B NATRIURETIC PEPTIDE - Abnormal; Notable for the following:    Pro B Natriuretic peptide (BNP)  6565.0 (*)     All other components within normal limits   URINALYSIS, ROUTINE W REFLEX MICROSCOPIC - Abnormal; Notable for the following:    Hgb urine dipstick  TRACE (*)     Protein, ur  100 (*)     Leukocytes, UA  SMALL (*)     All other components within normal limits   URINE MICROSCOPIC-ADD ON - Abnormal; Notable for the following:    Squamous Epithelial / LPF  FEW (*)     Bacteria, UA  FEW (*)     All other components within normal limits   TROPONIN I   Imaging Review  Dg Chest 2 View  10/17/2013 CLINICAL DATA: Short of breath, fluid retention EXAM: CHEST 2 VIEW COMPARISON: 10/03/2013 FINDINGS: Progression of airspace disease in the lingula which may represent  pneumonia. Minimal left effusion. Cardiac enlargement without heart failure. Pacemaker unchanged. Right lung clear. IMPRESSION: Mild progression of  lingular airspace disease, possible pneumonia. Electronically Signed By: Franchot Gallo M.D. On: 10/17/2013 16:14  EKG Interpretation  Date/Time: Tuesday Oct 17 2013 15:11:34 EDT  Ventricular Rate: 80  PR Interval: 208  QRS Duration: 115  QT Interval: 406  QTC Calculation: 468  R Axis: -69  Text Interpretation: Age not entered, assumed to be 80 years old for  purpose of ECG interpretation Sinus rhythm Ventricular bigeminy Borderline  prolonged PR interval non specific ivcd Inferior infarct, old Consider  anterior infarct No significant change since last tracing Confirmed by  POLLINA MD, [place] [phone]) on 10/17/2013 3:24:19 PM  MDM    Final diagnoses:   Peripheral edema   CAP (community acquired pneumonia)   possible CHF  Patient presents to the ER for evaluation of decrease fine her legs with slight difficulty breathing. Patient has a history of being treated with diuretics, but these were stopped several months ago, according to family. She does have pitting edema of the lower extremities with clear lung fields on auscultation. Patient administered Lasix IV here and has proceeded to diuresis. She is ambulating without difficulty in the hallway. No shortness of breath hypoxia with ambulation. She does have elevation of her BNP. From the records it is not clear if her previous treatment with diuretics was for peripheral edema or if she does have pre-existing diagnosis of congestive heart failure. I believe she is appropriate for outpatient therapy with diuretics and prompt followup with primary doctor.  Chest x-ray did not show evidence of overt congestive heart failure. She does, however, have lingular airspace disease that is possibly pneumonia. As there is some slight effusion associated with this, I suspect that this is early fluid and not  infection, but will require treatment with antibiotics as well as diuretics. Patient does not have any infectious type symptoms.  Orpah Greek, MD  10/17/13 [place], MD  10/17/13 1757  She is here today for follow up.  The lingular infiltrate based on chest x-ray same pneumonia and treated previously with Levaquin. She remains asymptomatic. I do not feel that there is a pneumonia based on her clinical presentation. However the patient seems to benefit from the Lasix 20 mg by mouth daily. Her weight is down approximately 5 pounds. She still has some mild swelling in her legs. There are no crackles in her lungs. There is no evidence of JVD. Her BNP in the hospital was greater than 6000.  She has an echocardiogram scheduled for Friday. Past Medical History  Diagnosis Date  . Hypertension   . Obesity   . Depression   . Tachy-brady syndrome   . Chronic kidney disease   . Gout    Current Outpatient Prescriptions on File Prior to Visit  Medication Sig Dispense Refill  . acetaminophen (TYLENOL) 650 MG CR tablet Take 650 mg by mouth every 8 (eight) hours as needed for pain.      Marland Kitchen allopurinol (ZYLOPRIM) 100 MG tablet Take 200 mg by mouth every morning.       Marland Kitchen aspirin 81 MG tablet Take 1 tablet (81 mg total) by mouth daily.  30 tablet  11  . benzonatate (TESSALON) 100 MG capsule Take 100 mg by mouth 2 (two) times daily as needed for cough.      . calcium carbonate (TUMS - DOSED IN MG ELEMENTAL CALCIUM) 500 MG chewable tablet Chew 1 tablet by mouth daily.      . carvedilol (COREG) 25 MG tablet Take 1 tablet (25  mg total) by mouth 2 (two) times daily with a meal.  60 tablet  11  . citalopram (CELEXA) 10 MG tablet Take 10 mg by mouth daily.      Marland Kitchen diltiazem (CARDIZEM CD) 180 MG 24 hr capsule Take 180 mg by mouth daily.      . famotidine (PEPCID) 20 MG tablet Take 20 mg by mouth every morning.      . ferrous sulfate 325 (65 FE) MG tablet Take 325 mg by mouth daily with  breakfast.      . omeprazole (PRILOSEC) 40 MG capsule Take 1 capsule (40 mg total) by mouth daily. For  stomach  30 capsule  6   No current facility-administered medications on file prior to visit.   No Known Allergies History   Social History  . Marital Status: Widowed    Spouse Name: N/A    Number of Children: N/A  . Years of Education: N/A   Occupational History  . Not on file.   Social History Main Topics  . Smoking status: Former Research scientist (life sciences)  . Smokeless tobacco: Not on file  . Alcohol Use: Not on file  . Drug Use: Not on file  . Sexual Activity: Not on file   Other Topics Concern  . Not on file   Social History Narrative  . No narrative on file      Review of Systems  All other systems reviewed and are negative.      Objective:   Physical Exam  Vitals reviewed. Constitutional: She appears well-developed and well-nourished. No distress.  Neck: Neck supple. No JVD present.  Cardiovascular: Normal rate, regular rhythm and normal heart sounds.   Pulmonary/Chest: Effort normal and breath sounds normal. No respiratory distress. She has no wheezes. She has no rales. She exhibits no tenderness.  Abdominal: Soft. Bowel sounds are normal. She exhibits no distension. There is no tenderness. There is no rebound and no guarding.  Musculoskeletal: She exhibits edema.  Skin: She is not diaphoretic.   trace bipedal edema        Assessment & Plan:  1. SOB (shortness of breath) I believe the patient's pneumonia has completely resolved. I she has benefited from the Lasix 20 mg by mouth daily. I will have her continue Lasix. I will check a BNP as well as a BMP.  I will await the results of echocardiogram. The patient continues to have dyspnea on exertion. She may benefit from resumption of digoxin if her ejection fraction is suppressed. - COMPLETE METABOLIC PANEL WITH GFR - Pro b natriuretic peptide (BNP) - furosemide (LASIX) 20 MG tablet; Take 1 tablet (20 mg total) by mouth  daily.  Dispense: 30 tablet; Refill: 2

## 2013-10-24 ENCOUNTER — Telehealth: Payer: Self-pay | Admitting: *Deleted

## 2013-10-24 DIAGNOSIS — R0602 Shortness of breath: Secondary | ICD-10-CM

## 2013-10-24 LAB — COMPLETE METABOLIC PANEL WITH GFR
ALT: <8 U/L (ref 0–35)
AST: 14 U/L (ref 0–37)
Albumin: 3.4 g/dL — ABNORMAL LOW (ref 3.5–5.2)
Alkaline Phosphatase: 79 U/L (ref 39–117)
BUN: 31 mg/dL — AB (ref 6–23)
CO2: 28 mEq/L (ref 19–32)
CREATININE: 1.88 mg/dL — AB (ref 0.50–1.10)
Calcium: 8.7 mg/dL (ref 8.4–10.5)
Chloride: 102 mEq/L (ref 96–112)
GFR, EST AFRICAN AMERICAN: 27 mL/min — AB
GFR, Est Non African American: 23 mL/min — ABNORMAL LOW
Glucose, Bld: 78 mg/dL (ref 70–99)
Potassium: 4.1 mEq/L (ref 3.5–5.3)
Sodium: 143 mEq/L (ref 135–145)
Total Bilirubin: 0.5 mg/dL (ref 0.2–1.2)
Total Protein: 6.8 g/dL (ref 6.0–8.3)

## 2013-10-24 LAB — PRO B NATRIURETIC PEPTIDE: PRO B NATRI PEPTIDE: 4105 pg/mL — AB (ref <451)

## 2013-10-24 NOTE — Telephone Encounter (Signed)
Pt called says she had lost her RX for LASIX and wants to know if you can write her another.   Pharmacy [place]

## 2013-10-25 MED ORDER — FUROSEMIDE 20 MG PO TABS: 20.0000 mg | ORAL_TABLET | Freq: Every day | ORAL | Status: DC

## 2013-10-25 NOTE — Telephone Encounter (Signed)
Med sent to pharm 

## 2013-10-26 ENCOUNTER — Other Ambulatory Visit: Payer: Self-pay | Admitting: Family Medicine

## 2013-10-26 NOTE — Telephone Encounter (Signed)
?   OK to Refill  

## 2013-10-26 NOTE — Telephone Encounter (Signed)
ok 

## 2013-10-26 NOTE — Telephone Encounter (Signed)
Call placed to patient daughter, [name].  States that patient cough has improved and is only voicing c/o cough at night.

## 2013-10-26 NOTE — Telephone Encounter (Signed)
Prescription sent to pharmacy.

## 2013-10-27 ENCOUNTER — Telehealth: Payer: Self-pay | Admitting: Family Medicine

## 2013-10-27 ENCOUNTER — Ambulatory Visit (HOSPITAL_COMMUNITY): Payer: Medicare Other | Attending: Cardiology | Admitting: Cardiology

## 2013-10-27 DIAGNOSIS — E669 Obesity, unspecified: Secondary | ICD-10-CM | POA: Insufficient documentation

## 2013-10-27 DIAGNOSIS — R0600 Dyspnea, unspecified: Secondary | ICD-10-CM

## 2013-10-27 DIAGNOSIS — R0609 Other forms of dyspnea: Secondary | ICD-10-CM

## 2013-10-27 DIAGNOSIS — R0602 Shortness of breath: Secondary | ICD-10-CM | POA: Insufficient documentation

## 2013-10-27 DIAGNOSIS — R609 Edema, unspecified: Secondary | ICD-10-CM

## 2013-10-27 DIAGNOSIS — I079 Rheumatic tricuspid valve disease, unspecified: Secondary | ICD-10-CM | POA: Insufficient documentation

## 2013-10-27 DIAGNOSIS — I10 Essential (primary) hypertension: Secondary | ICD-10-CM | POA: Insufficient documentation

## 2013-10-27 DIAGNOSIS — I495 Sick sinus syndrome: Secondary | ICD-10-CM | POA: Insufficient documentation

## 2013-10-27 DIAGNOSIS — I059 Rheumatic mitral valve disease, unspecified: Secondary | ICD-10-CM | POA: Insufficient documentation

## 2013-10-27 MED ORDER — FUROSEMIDE 20 MG PO TABS: 20.0000 mg | ORAL_TABLET | Freq: Every day | ORAL | Status: DC | PRN

## 2013-10-27 NOTE — Telephone Encounter (Signed)
Spoke to patient about lab results.  Explained to her carefully about the PRN use of her Lasix (her water pill)  Must weigh herself daily.

## 2013-10-27 NOTE — Telephone Encounter (Signed)
Message copied by Olena Mater on Fri Oct 27, 2013 11:48 AM ------      Message from: Jenna Luo      Created: Tue Oct 24, 2013  7:09 AM       Renal function has worsened.  Change lasix to 20 mg poqd prn leg swelling.  Do not take everyday.  Her weight should be around 210 lbs.  SHe needs to weigh herself daily and take lasix if her weight rises greater than 1 lb in a day or significant swelling occurs in her legs. ------

## 2013-10-27 NOTE — Progress Notes (Signed)
Echo performed. 

## 2013-11-02 ENCOUNTER — Other Ambulatory Visit: Payer: Self-pay | Admitting: Family Medicine

## 2013-11-02 DIAGNOSIS — I509 Heart failure, unspecified: Secondary | ICD-10-CM

## 2013-11-02 MED ORDER — VALSARTAN 160 MG PO TABS: 160.0000 mg | ORAL_TABLET | Freq: Every day | ORAL | Status: DC

## 2013-11-06 ENCOUNTER — Encounter: Payer: Self-pay | Admitting: *Deleted

## 2013-11-23 ENCOUNTER — Ambulatory Visit (INDEPENDENT_AMBULATORY_CARE_PROVIDER_SITE_OTHER): Payer: Medicare Other | Admitting: *Deleted

## 2013-11-23 DIAGNOSIS — I495 Sick sinus syndrome: Secondary | ICD-10-CM

## 2013-11-23 LAB — MDC_IDC_ENUM_SESS_TYPE_INCLINIC
Brady Statistic RV Percent Paced: 8.3 %
Date Time Interrogation Session: 20150702153522
Implantable Pulse Generator Serial Number: 1124188
Lead Channel Impedance Value: 285 Ohm
Lead Channel Pacing Threshold Amplitude: 1.25 V
Lead Channel Pacing Threshold Pulse Width: 0.4 ms
Lead Channel Sensing Intrinsic Amplitude: 1.4 mV
Lead Channel Sensing Intrinsic Amplitude: 7.6 mV
Lead Channel Setting Pacing Amplitude: 2 V
Lead Channel Setting Sensing Sensitivity: 1.5 mV
MDC IDC MSMT BATTERY IMPEDANCE: 1900 Ohm
MDC IDC MSMT BATTERY VOLTAGE: 2.76 V
MDC IDC MSMT LEADCHNL RA IMPEDANCE VALUE: 212 Ohm
MDC IDC MSMT LEADCHNL RA PACING THRESHOLD AMPLITUDE: 1 V
MDC IDC MSMT LEADCHNL RA PACING THRESHOLD PULSEWIDTH: 0.6 ms
MDC IDC SET LEADCHNL RV PACING PULSEWIDTH: 0.4 ms
MDC IDC STAT BRADY RA PERCENT PACED: 31 %

## 2013-11-23 NOTE — Progress Notes (Signed)
Pacemaker check in clinic. Normal device function. Thresholds, sensing, impedances consistent with previous measurements. Device programmed to maximize longevity. 538 mode switches, <1%, no EGM's available.  No high ventricular rates noted. Device programmed at appropriate safety margins. Histogram distribution appropriate for patient activity level. Device programmed to optimize intrinsic conduction. Estimated longevity 3 years.  Patient education completed.  ROV 6 months with Dr. Caryl Comes.

## 2013-11-27 ENCOUNTER — Other Ambulatory Visit: Payer: Self-pay | Admitting: Family Medicine

## 2013-11-27 ENCOUNTER — Encounter: Payer: Self-pay | Admitting: Family Medicine

## 2013-11-27 ENCOUNTER — Ambulatory Visit: Payer: Medicare Other | Admitting: Physician Assistant

## 2013-11-27 ENCOUNTER — Ambulatory Visit (INDEPENDENT_AMBULATORY_CARE_PROVIDER_SITE_OTHER): Payer: Medicare Other | Admitting: Family Medicine

## 2013-11-27 VITALS — BP 136/74 | HR 68 | Temp 96.9°F | Resp 18 | Ht 61.5 in | Wt 210.0 lb

## 2013-11-27 DIAGNOSIS — Z23 Encounter for immunization: Secondary | ICD-10-CM

## 2013-11-27 DIAGNOSIS — I5020 Unspecified systolic (congestive) heart failure: Secondary | ICD-10-CM

## 2013-11-27 DIAGNOSIS — I509 Heart failure, unspecified: Secondary | ICD-10-CM

## 2013-11-27 LAB — BASIC METABOLIC PANEL WITH GFR
BUN: 27 mg/dL — ABNORMAL HIGH (ref 6–23)
CALCIUM: 8.2 mg/dL — AB (ref 8.4–10.5)
CO2: 30 mEq/L (ref 19–32)
Chloride: 101 mEq/L (ref 96–112)
Creat: 1.29 mg/dL — ABNORMAL HIGH (ref 0.50–1.10)
GFR, Est African American: 42 mL/min — ABNORMAL LOW
GFR, Est Non African American: 37 mL/min — ABNORMAL LOW
Glucose, Bld: 81 mg/dL (ref 70–99)
Potassium: 4.5 mEq/L (ref 3.5–5.3)
Sodium: 140 mEq/L (ref 135–145)

## 2013-11-27 LAB — CBC WITH DIFFERENTIAL/PLATELET
BASOS ABS: 0 10*3/uL (ref 0.0–0.1)
Basophils Relative: 0 % (ref 0–1)
Eosinophils Absolute: 0.3 10*3/uL (ref 0.0–0.7)
Eosinophils Relative: 5 % (ref 0–5)
HCT: 34.6 % — ABNORMAL LOW (ref 36.0–46.0)
Hemoglobin: 11.6 g/dL — ABNORMAL LOW (ref 12.0–15.0)
LYMPHS PCT: 42 % (ref 12–46)
Lymphs Abs: 2.1 10*3/uL (ref 0.7–4.0)
MCH: 29.4 pg (ref 26.0–34.0)
MCHC: 33.5 g/dL (ref 30.0–36.0)
MCV: 87.8 fL (ref 78.0–100.0)
Monocytes Absolute: 0.5 10*3/uL (ref 0.1–1.0)
Monocytes Relative: 9 % (ref 3–12)
Neutro Abs: 2.2 10*3/uL (ref 1.7–7.7)
Neutrophils Relative %: 44 % (ref 43–77)
PLATELETS: 184 10*3/uL (ref 150–400)
RBC: 3.94 MIL/uL (ref 3.87–5.11)
RDW: 14.5 % (ref 11.5–15.5)
WBC: 5.1 10*3/uL (ref 4.0–10.5)

## 2013-11-27 NOTE — Progress Notes (Signed)
Subjective:    Patient ID: [name], female    DOB: Dec 01, 1922, 80 y.o.   MRN: [id number]  HPI I have recently been seeing the patient for dyspnea on exertion. Obtained a 2-D echocardiogram which revealed an ejection fraction of 25-30%, severe global reduction in LV function; biatrial enlargement; mild to moderate MR; moderate TR; moderately elevated pulmonary pressures.  At that point I added Diovan 160 mg by mouth daily to her carvedilol 25 mg by mouth twice a day.  Patient takes Lasix 20 mg by mouth daily as needed for swelling in the legs leg or weight gain greater than 2 lbs in a day.  She has not yet seen her cardiologist. She denies any chest pain. She denies any orthopnea or paroxysmal nocturnal dyspnea. She continues to have dyspnea on exertion although it is improved. This is primarily to her consuming a low sodium diet now.. She is no longer eating junk food and high salt containing foods.  She denies any syncope or presyncope.  Past Medical History  Diagnosis Date  . Hypertension   . Obesity   . Depression   . Tachy-brady syndrome   . Chronic kidney disease   . Gout    Past Surgical History  Procedure Laterality Date  . Pacemaker insertion     Current Outpatient Prescriptions on File Prior to Visit  Medication Sig Dispense Refill  . acetaminophen (TYLENOL) 650 MG CR tablet Take 650 mg by mouth every 8 (eight) hours as needed for pain.      Marland Kitchen allopurinol (ZYLOPRIM) 100 MG tablet Take 200 mg by mouth every morning.       Marland Kitchen aspirin 81 MG tablet Take 1 tablet (81 mg total) by mouth daily.  30 tablet  11  . benzonatate (TESSALON) 100 MG capsule TAKE 1 CAPSULE BY MOUTH TWICE DAILY AS NEEDED FOR COUGH  20 capsule  1  . calcium carbonate (TUMS - DOSED IN MG ELEMENTAL CALCIUM) 500 MG chewable tablet Chew 1 tablet by mouth daily.      . carvedilol (COREG) 25 MG tablet Take 1 tablet (25 mg total) by mouth 2 (two) times daily with a meal.  60 tablet  11  . citalopram (CELEXA) 10  MG tablet Take 10 mg by mouth daily.      Marland Kitchen diltiazem (CARDIZEM CD) 180 MG 24 hr capsule Take 180 mg by mouth daily.      . famotidine (PEPCID) 20 MG tablet Take 20 mg by mouth every morning.      . ferrous sulfate 325 (65 FE) MG tablet Take 65 mg by mouth daily with breakfast.       . furosemide (LASIX) 20 MG tablet Take 1 tablet (20 mg total) by mouth daily as needed (take only if one pound weight increase in a day or severe swelling in feet and legs).  30 tablet  2  . omeprazole (PRILOSEC) 40 MG capsule Take 1 capsule (40 mg total) by mouth daily. For  stomach  30 capsule  6  . valsartan (DIOVAN) 160 MG tablet Take 1 tablet (160 mg total) by mouth daily.  30 tablet  5   No current facility-administered medications on file prior to visit.   No Known Allergies History   Social History  . Marital Status: Widowed    Spouse Name: N/A    Number of Children: N/A  . Years of Education: N/A   Occupational History  . Not on file.   Social History Main  Topics  . Smoking status: Former Research scientist (life sciences)  . Smokeless tobacco: Not on file  . Alcohol Use: Not on file  . Drug Use: Not on file  . Sexual Activity: Not on file   Other Topics Concern  . Not on file   Social History Narrative  . No narrative on file        Review of Systems  All other systems reviewed and are negative.      Objective:   Physical Exam  Vitals reviewed. Constitutional: She appears well-developed and well-nourished. No distress.  Neck: Neck supple. No JVD present. No thyromegaly present.  Cardiovascular: Normal rate, regular rhythm and normal heart sounds.   No murmur heard. Pulmonary/Chest: Effort normal and breath sounds normal. No respiratory distress. She has no wheezes. She has no rales.  Abdominal: Soft. Bowel sounds are normal. She exhibits no distension. There is no tenderness. There is no rebound and no guarding.  Lymphadenopathy:    She has no cervical adenopathy.  Skin: She is not diaphoretic.    patient has trace bipedal edema        Assessment & Plan:  1. Systolic congestive heart failure, unspecified congestive heart failure chronicity Recheck BMP after one month of being on diovan and monitor renal function as well as potassium. Given the reduced ejection fraction, the patient would benefit from spironolactone and. However I will consult her cardiologist and defer to their judgment on whether or not to start this medication. I also monitor her anemia with a CBC.  I emphasized the need for a low sodium diet. I also gave the patient Prevnar 13 today in the office. - BASIC METABOLIC PANEL WITH GFR - CBC with Differential - Ambulatory referral to Cardiology

## 2013-11-27 NOTE — Telephone Encounter (Signed)
Refill appropriate and filled per protocol. 

## 2013-12-12 ENCOUNTER — Encounter: Payer: Self-pay | Admitting: Internal Medicine

## 2013-12-13 ENCOUNTER — Telehealth: Payer: Self-pay | Admitting: Family Medicine

## 2013-12-13 NOTE — Telephone Encounter (Signed)
Called this morning and LM for pt to call back needing to schedule [facility] and CPE

## 2013-12-19 ENCOUNTER — Other Ambulatory Visit: Payer: Self-pay | Admitting: Family Medicine

## 2013-12-19 NOTE — Telephone Encounter (Signed)
Refill appropriate and filled per protocol. 

## 2013-12-21 ENCOUNTER — Ambulatory Visit: Payer: Self-pay | Admitting: Family Medicine

## 2013-12-23 ENCOUNTER — Other Ambulatory Visit: Payer: Self-pay | Admitting: Family Medicine

## 2013-12-25 NOTE — Telephone Encounter (Signed)
Ok, but ntbs if having sob.  Missed last appt.

## 2013-12-25 NOTE — Telephone Encounter (Signed)
Prescription sent to pharmacy.

## 2013-12-25 NOTE — Telephone Encounter (Signed)
?   OK to Refill  

## 2013-12-25 NOTE — Telephone Encounter (Signed)
Refill appropriate and filled per protocol. 

## 2013-12-26 ENCOUNTER — Ambulatory Visit: Payer: Medicare Other | Admitting: Family Medicine

## 2013-12-27 ENCOUNTER — Ambulatory Visit (INDEPENDENT_AMBULATORY_CARE_PROVIDER_SITE_OTHER): Payer: Medicare Other | Admitting: Physician Assistant

## 2013-12-27 ENCOUNTER — Encounter: Payer: Self-pay | Admitting: Physician Assistant

## 2013-12-27 VITALS — BP 154/77 | HR 66 | Ht 61.5 in | Wt 199.4 lb

## 2013-12-27 DIAGNOSIS — R0602 Shortness of breath: Secondary | ICD-10-CM

## 2013-12-27 DIAGNOSIS — I10 Essential (primary) hypertension: Secondary | ICD-10-CM

## 2013-12-27 DIAGNOSIS — Z950 Presence of cardiac pacemaker: Secondary | ICD-10-CM

## 2013-12-27 DIAGNOSIS — I428 Other cardiomyopathies: Secondary | ICD-10-CM

## 2013-12-27 DIAGNOSIS — I429 Cardiomyopathy, unspecified: Secondary | ICD-10-CM | POA: Insufficient documentation

## 2013-12-27 MED ORDER — FUROSEMIDE 20 MG PO TABS: 20.0000 mg | ORAL_TABLET | Freq: Every day | ORAL | Status: DC

## 2013-12-27 NOTE — Patient Instructions (Signed)
Your physician has recommended you make the following change in your medication:  1. Stop Diltiazem.   2. Increase lasix to 20 mg daily.   Your physician recommends that you return for lab work on 01/03/14 for Bmet.   Your physician recommends that you schedule a follow-up appointment in: 4-6 weeks with Dr. Caryl Comes.  Low-Sodium Eating Plan Sodium raises blood pressure and causes water to be held in the body. Getting less sodium from food will help lower your blood pressure, reduce any swelling, and protect your heart, liver, and kidneys. We get sodium by adding salt (sodium chloride) to food. Most of our sodium comes from canned, boxed, and frozen foods. Restaurant foods, fast foods, and pizza are also very high in sodium. Even if you take medicine to lower your blood pressure or to reduce fluid in your body, getting less sodium from your food is important. WHAT IS MY PLAN? Most people should limit their sodium intake to 2,300 mg a day. Your health care provider recommends that you limit your sodium intake to 2000 mg a day.  WHAT DO I NEED TO KNOW ABOUT THIS EATING PLAN? For the low-sodium eating plan, you will follow these general guidelines:  Choose foods with a % Daily Value for sodium of less than 5% (as listed on the food label).   Use salt-free seasonings or herbs instead of table salt or sea salt.   Check with your health care provider or pharmacist before using salt substitutes.   Eat fresh foods.  Eat more vegetables and fruits.  Limit canned vegetables. If you do use them, rinse them well to decrease the sodium.   Limit cheese to 1 oz (28 g) per day.   Eat lower-sodium products, often labeled as "lower sodium" or "no salt added."  Avoid foods that contain monosodium glutamate (MSG). MSG is sometimes added to [place] food and some canned foods.  Check food labels (Nutrition Facts labels) on foods to learn how much sodium is in one serving.  Eat more home-cooked food  and less restaurant, buffet, and fast food.  When eating at a restaurant, ask that your food be prepared with less salt or none, if possible.  HOW DO I READ FOOD LABELS FOR SODIUM INFORMATION? The Nutrition Facts label lists the amount of sodium in one serving of the food. If you eat more than one serving, you must multiply the listed amount of sodium by the number of servings. Food labels may also identify foods as:  Sodium free--Less than 5 mg in a serving.  Very low sodium--35 mg or less in a serving.  Low sodium--140 mg or less in a serving.  Light in sodium--50% less sodium in a serving. For example, if a food that usually has 300 mg of sodium is changed to become light in sodium, it will have 150 mg of sodium.  Reduced sodium--25% less sodium in a serving. For example, if a food that usually has 400 mg of sodium is changed to reduced sodium, it will have 300 mg of sodium. WHAT FOODS CAN I EAT? Grains Low-sodium cereals, including oats, puffed wheat and rice, and shredded wheat cereals. Low-sodium crackers. Unsalted rice and pasta. Lower-sodium bread.  Vegetables Frozen or fresh vegetables. Low-sodium or reduced-sodium canned vegetables. Low-sodium or reduced-sodium tomato sauce and paste. Low-sodium or reduced-sodium tomato and vegetable juices.  Fruits Fresh, frozen, and canned fruit. Fruit juice.  Meat and Other Protein Products Low-sodium canned tuna and salmon. Fresh or frozen meat, poultry, seafood,  and fish. Lamb. Unsalted nuts. Dried beans, peas, and lentils without added salt. Unsalted canned beans. Homemade soups without salt. Eggs.  Dairy Milk. Soy milk. Ricotta cheese. Low-sodium or reduced-sodium cheeses. Yogurt.  Condiments Fresh and dried herbs and spices. Salt-free seasonings. Onion and garlic powders. Low-sodium varieties of mustard and ketchup. Lemon juice.  Fats and Oils Reduced-sodium salad dressings. Unsalted butter.  Other Unsalted popcorn and  pretzels.  The items listed above may not be a complete list of recommended foods or beverages. Contact your dietitian for more options. WHAT FOODS ARE NOT RECOMMENDED? Grains Instant hot cereals. Bread stuffing, pancake, and biscuit mixes. Croutons. Seasoned rice or pasta mixes. Noodle soup cups. Boxed or frozen macaroni and cheese. Self-rising flour. Regular salted crackers. Vegetables Regular canned vegetables. Regular canned tomato sauce and paste. Regular tomato and vegetable juices. Frozen vegetables in sauces. Salted french fries. Olives. Angie Fava. Relishes. Sauerkraut. Salsa. Meat and Other Protein Products Salted, canned, smoked, spiced, or pickled meats, seafood, or fish. Bacon, ham, sausage, hot dogs, corned beef, chipped beef, and packaged luncheon meats. Salt pork. Jerky. Pickled herring. Anchovies, regular canned tuna, and sardines. Salted nuts. Dairy Processed cheese and cheese spreads. Cheese curds. Blue cheese and cottage cheese. Buttermilk.  Condiments Onion and garlic salt, seasoned salt, table salt, and sea salt. Canned and packaged gravies. Worcestershire sauce. Tartar sauce. Barbecue sauce. Teriyaki sauce. Soy sauce, including reduced sodium. Steak sauce. Fish sauce. Oyster sauce. Cocktail sauce. Horseradish. Regular ketchup and mustard. Meat flavorings and tenderizers. Bouillon cubes. Hot sauce. Tabasco sauce. Marinades. Taco seasonings. Relishes. Fats and Oils Regular salad dressings. Salted butter. Margarine. Ghee. Bacon fat.  Other Potato and tortilla chips. Corn chips and puffs. Salted popcorn and pretzels. Canned or dried soups. Pizza. Frozen entrees and pot pies.  The items listed above may not be a complete list of foods and beverages to avoid. Contact your dietitian for more information. Document Released: 10/31/2001 Document Revised: 05/16/2013 Document Reviewed: 03/15/2013 [facility] Patient Information 2015 [place], [place]. This information is not intended  to replace advice given to you by your health care provider. Make sure you discuss any questions you have with your health care provider.

## 2013-12-27 NOTE — Assessment & Plan Note (Signed)
Patient has a new cardiomyopathy ejection fraction 25-30% on recent 2-D echo. She has dyspnea on exertion and is only taking Lasix once a week. I discussed this patient in detail with Dr. Caryl Comes. He recommends Lasix 20 mg daily. We will stop her Cardizem. We will hold off on starting spironolactone and see how she does on a daily diuretic. She has chronic renal insufficiency. She will need lab work next week. Followup with Dr. Caryl Comes in one month. She has no symptoms of chest to suggest ischemia, but this could certainly be a possibility.

## 2013-12-27 NOTE — Assessment & Plan Note (Signed)
Blood pressure is high today. 2 g sodium diet, daily diuretic. She may need another medication. She sees Dr. Jewel Baize tomorrow

## 2013-12-27 NOTE — Progress Notes (Signed)
HPI: This is an 80 year old female patient of Dr. Caryl Comes and Dr. Dennard Schaumann who has history of a pacemaker implanted originally in 2002 replaced with the lead revision in 2007.   She recently had symptoms of dyspnea on exertion and 2-D echo ordered by Dr. Dennard Schaumann revealed an ejection fraction of 25-30% with severe global reduction in LV function, biatrial enlargement and mild to moderate MR, moderate TR and moderately elevated pulmonary pressures. This was on 10/27/13 prior echo in 2013 showed normal LV function. Dr. Dennard Schaumann added Diovan 160 mg daily and she takes Lasix 20 mg when necessary. She is referred for further evaluation.  Patient complains of dyspnea on exertion with very little activity. She gets short of breath when making her bed up. She has really cut back on the salt. She says she'll he takes Lasix once a week when her legs swell because her scales don't work and she doesn't know of she is gaining weight or not.  No Known Allergies  Current Outpatient Prescriptions on File Prior to Visit: acetaminophen (TYLENOL) 650 MG CR tablet, Take 650 mg by mouth every 8 (eight) hours as needed for pain., Disp: , Rfl:  allopurinol (ZYLOPRIM) 100 MG tablet, TAKE 2 TABLETS BY MOUTH ONCE DAILY, Disp: 60 tablet, Rfl: 4 aspirin 81 MG tablet, Take 1 tablet (81 mg total) by mouth daily., Disp: 30 tablet, Rfl: 11 benzonatate (TESSALON) 100 MG capsule, TAKE 1 CAPSULE BY MOUTH TWICE DAILY AS NEEDED FOR COUGH, Disp: 20 capsule, Rfl: 0 calcium carbonate (TUMS - DOSED IN MG ELEMENTAL CALCIUM) 500 MG chewable tablet, Chew 1 tablet by mouth daily., Disp: , Rfl:  carvedilol (COREG) 25 MG tablet, Take 1 tablet (25 mg total) by mouth 2 (two) times daily with a meal., Disp: 60 tablet, Rfl: 11 citalopram (CELEXA) 10 MG tablet, TAKE 1 TABLET BY MOUTH DAILY, Disp: 30 tablet, Rfl: 6 diltiazem (CARDIZEM CD) 180 MG 24 hr capsule, TAKE ONE CAPSULE BY MOUTH DAILY, Disp: 30 capsule, Rfl: 4 famotidine (PEPCID) 20 MG tablet, Take 20  mg by mouth every morning., Disp: , Rfl:  ferrous sulfate 325 (65 FE) MG tablet, Take 65 mg by mouth daily with breakfast. , Disp: , Rfl:  furosemide (LASIX) 20 MG tablet, Take 1 tablet (20 mg total) by mouth daily as needed (take only if one pound weight increase in a day or severe swelling in feet and legs)., Disp: 30 tablet, Rfl: 2 omeprazole (PRILOSEC) 40 MG capsule, TAKE 1 CAPSULE BY MOUTH DAILY, Disp: 30 capsule, Rfl: 6 valsartan (DIOVAN) 160 MG tablet, Take 1 tablet (160 mg total) by mouth daily., Disp: 30 tablet, Rfl: 5  No current facility-administered medications on file prior to visit.   Past Medical History:   Hypertension                                                 Obesity                                                      Depression  Tachy-brady syndrome                                         Chronic kidney disease                                       Gout                                                        Past Surgical History:   PACEMAKER INSERTION                                          History reviewed.  No pertinent family history.   Social History   Marital Status: Widowed             Spouse Name:                      Years of Education:                 Number of children:             Occupational History   None on file  Social History Main Topics   Smoking Status: Former Smoker                   Packs/Day: 0.00  Years:         Smokeless Status: Not on file                      Comment: quit 2011   Alcohol Use: No             Drug Use: No             Sexual Activity: Not on file        Other Topics            Concern   None on file  Social History Narrative   None on file    ROS: See history of present illness otherwise negative   PHYSICAL EXAM: Obese, in no acute distress. Neck: No JVD, HJR, Bruit, or thyroid enlargement  Lungs: Decreased breath sounds but No tachypnea, clear without  wheezing, rales, or rhonchi  Cardiovascular: RRR, PMI not displaced, 2/6 systolic murmur at the left sternal border, no gallops, bruit, thrill, or heave.  Abdomen: BS normal. Soft without organomegaly, masses, lesions or tenderness.  Extremities: Trace of edema bilaterally, otherwise lower extremities without cyanosis, clubbing. Good distal pulses bilateral  SKin: Warm, no lesions or rashes   Musculoskeletal: No deformities  Neuro: no focal signs  BP 154/77  Pulse 66  Ht 5' 1.5" (1.562 m)  Wt 199 lb 6.4 oz (90.447 kg)  BMI 37.07 kg/m2   EKG: Normal sinus rhythm with first degree AV block inferior Q waves, no acute change from prior tracing

## 2013-12-27 NOTE — Assessment & Plan Note (Signed)
Pacer followup as scheduled.

## 2013-12-28 ENCOUNTER — Ambulatory Visit (INDEPENDENT_AMBULATORY_CARE_PROVIDER_SITE_OTHER): Payer: Medicare Other | Admitting: Family Medicine

## 2013-12-28 ENCOUNTER — Encounter: Payer: Self-pay | Admitting: Family Medicine

## 2013-12-28 VITALS — BP 180/80 | HR 86 | Temp 97.8°F | Resp 22 | Ht 61.5 in | Wt 210.0 lb

## 2013-12-28 DIAGNOSIS — R0609 Other forms of dyspnea: Secondary | ICD-10-CM

## 2013-12-28 DIAGNOSIS — R0989 Other specified symptoms and signs involving the circulatory and respiratory systems: Secondary | ICD-10-CM

## 2013-12-28 DIAGNOSIS — R0602 Shortness of breath: Secondary | ICD-10-CM

## 2013-12-28 MED ORDER — VALSARTAN 320 MG PO TABS: 320.0000 mg | ORAL_TABLET | Freq: Every day | ORAL | Status: DC

## 2013-12-28 MED ORDER — AMLODIPINE BESYLATE 5 MG PO TABS: 5.0000 mg | ORAL_TABLET | Freq: Every day | ORAL | Status: DC

## 2013-12-28 NOTE — Progress Notes (Signed)
Subjective:    Patient ID: [name], female    DOB: 08-Aug-1922, 80 y.o.   MRN: [id number]  HPI 11/27/13 I have recently been seeing the patient for dyspnea on exertion. Obtained a 2-D echocardiogram which revealed an ejection fraction of 25-30%, severe global reduction in LV function; biatrial enlargement; mild to moderate MR; moderate TR; moderately elevated pulmonary pressures.  At that point I added Diovan 160 mg by mouth daily to her carvedilol 25 mg by mouth twice a day.  Patient takes Lasix 20 mg by mouth daily as needed for swelling in the legs leg or weight gain greater than 2 lbs in a day.  She has not yet seen her cardiologist. She denies any chest pain. She denies any orthopnea or paroxysmal nocturnal dyspnea. She continues to have dyspnea on exertion although it is improved. This is primarily to her consuming a low sodium diet now.. She is no longer eating junk food and high salt containing foods.  She denies any syncope or presyncope.  At that time, my plan was: 1. Systolic congestive heart failure, unspecified congestive heart failure chronicity Recheck BMP after one month of being on diovan and monitor renal function as well as potassium. Given the reduced ejection fraction, the patient would benefit from spironolactone and. However I will consult her cardiologist and defer to their judgment on whether or not to start this medication. I also monitor her anemia with a CBC.  I emphasized the need for a low sodium diet. I also gave the patient Prevnar 13 today in the office. - BASIC METABOLIC PANEL WITH GFR - CBC with Differential - Ambulatory referral to Cardiology  12/28/13 Patient is here today for follow up.  She saw cardiology (8/5) who recommended:  Patient has a new cardiomyopathy ejection fraction 25-30% on recent 2-D echo. She has dyspnea on exertion and is only taking Lasix once a week. I discussed this patient in detail with Dr. Caryl Comes. He recommends Lasix 20 mg daily. We  will stop her Cardizem. We will hold off on starting spironolactone and see how she does on a daily diuretic. She has chronic renal insufficiency. She will need lab work next week. Followup with Dr. Caryl Comes in one month. She has no symptoms of chest to suggest ischemia, but this could certainly be a possibility.  She is here today requesting an inhaler or nebulizer.  To my knowledge she has no history of asthma or emphysema.  She did have secondhand smoke exposure. She also smoked intermittently and only quit 4 years ago.  Patient states that whenever she is walking, she begins to wheeze and becomes increasingly short of breath.  She has significant congestive heart failure which could also cause the dyspnea on exertion. Furthermore ischemia has not been completely excluded. However I have never performed a pulmonary function test to evaluate the patient for obstructive lung disease.    Since stopping her Cardizem yesterday the patient's systolic blood pressure has risen from 154 to greater than 180.      Past Medical History  Diagnosis Date  . Hypertension   . Obesity   . Depression   . Tachy-brady syndrome   . Chronic kidney disease   . Gout    Past Surgical History  Procedure Laterality Date  . Pacemaker insertion     Current Outpatient Prescriptions on File Prior to Visit  Medication Sig Dispense Refill  . acetaminophen (TYLENOL) 650 MG CR tablet Take 650 mg by mouth every 8 (eight)  hours as needed for pain.      Marland Kitchen allopurinol (ZYLOPRIM) 100 MG tablet TAKE 2 TABLETS BY MOUTH ONCE DAILY  60 tablet  4  . aspirin 81 MG tablet Take 1 tablet (81 mg total) by mouth daily.  30 tablet  11  . benzonatate (TESSALON) 100 MG capsule TAKE 1 CAPSULE BY MOUTH TWICE DAILY AS NEEDED FOR COUGH  20 capsule  0  . calcium carbonate (TUMS - DOSED IN MG ELEMENTAL CALCIUM) 500 MG chewable tablet Chew 1 tablet by mouth daily.      . carvedilol (COREG) 25 MG tablet Take 1 tablet (25 mg total) by mouth 2 (two)  times daily with a meal.  60 tablet  11  . citalopram (CELEXA) 10 MG tablet TAKE 1 TABLET BY MOUTH DAILY  30 tablet  6  . famotidine (PEPCID) 20 MG tablet Take 20 mg by mouth every morning.      . ferrous sulfate 325 (65 FE) MG tablet Take 65 mg by mouth daily with breakfast.       . furosemide (LASIX) 20 MG tablet Take 1 tablet (20 mg total) by mouth daily.  30 tablet  2  . omeprazole (PRILOSEC) 40 MG capsule TAKE 1 CAPSULE BY MOUTH DAILY  30 capsule  6  . valsartan (DIOVAN) 160 MG tablet Take 1 tablet (160 mg total) by mouth daily.  30 tablet  5   No current facility-administered medications on file prior to visit.   No Known Allergies History   Social History  . Marital Status: Widowed    Spouse Name: N/A    Number of Children: N/A  . Years of Education: N/A   Occupational History  . Not on file.   Social History Main Topics  . Smoking status: Former Research scientist (life sciences)  . Smokeless tobacco: Not on file     Comment: quit 2011  . Alcohol Use: No  . Drug Use: No  . Sexual Activity: Not on file   Other Topics Concern  . Not on file   Social History Narrative  . No narrative on file        Review of Systems  All other systems reviewed and are negative.      Objective:   Physical Exam  Vitals reviewed. Constitutional: She appears well-developed and well-nourished. No distress.  Neck: Neck supple. No JVD present. No thyromegaly present.  Cardiovascular: Normal rate, regular rhythm and normal heart sounds.   No murmur heard. Pulmonary/Chest: Effort normal and breath sounds normal. No respiratory distress. She has no wheezes. She has no rales.  Abdominal: Soft. Bowel sounds are normal. She exhibits no distension. There is no tenderness. There is no rebound and no guarding.  Lymphadenopathy:    She has no cervical adenopathy.  Skin: She is not diaphoretic.   patient has trace bipedal edema Faint left basilar crackles       Assessment & Plan:  Dyspnea on exertion  SOB  (shortness of breath)  I performed a pulmonary function test today in the office.  FEV1% was 54. FEV1 was 59% of predicted. If this is correct this would place the patient in stage II COPD.  However I still believe that the majority of her problems are due to her congestive heart failure. Prior to beginning the patient on albuterol I would like to try to address her cardiac problems first.  Patient's blood pressure is significantly elevated. Therefore I will increase Diovan to 320 mg by mouth daily. I will also  add amlodipine 5 mg by mouth daily. Hopefully, if I can decrease her blood pressure and reduce her afterload, it will improve her cardiac output and improve her dyspnea on exertion. I encouraged her to continue Lasix 20 mg by mouth daily. Check BMP next week. Followup as planned with cardiology in one month.

## 2014-01-03 ENCOUNTER — Other Ambulatory Visit (INDEPENDENT_AMBULATORY_CARE_PROVIDER_SITE_OTHER): Payer: Medicare Other

## 2014-01-03 DIAGNOSIS — R0602 Shortness of breath: Secondary | ICD-10-CM

## 2014-01-04 LAB — BASIC METABOLIC PANEL
BUN: 27 mg/dL — ABNORMAL HIGH (ref 6–23)
CO2: 33 mEq/L — ABNORMAL HIGH (ref 19–32)
CREATININE: 1.7 mg/dL — AB (ref 0.4–1.2)
Calcium: 8.4 mg/dL (ref 8.4–10.5)
Chloride: 97 mEq/L (ref 96–112)
GFR: 37.38 mL/min — AB (ref >60.00)
Glucose, Bld: 68 mg/dL — ABNORMAL LOW (ref 70–99)
Potassium: 4.7 mEq/L (ref 3.5–5.1)
Sodium: 135 mEq/L (ref 135–145)

## 2014-01-08 ENCOUNTER — Other Ambulatory Visit: Payer: Self-pay | Admitting: *Deleted

## 2014-01-08 DIAGNOSIS — I10 Essential (primary) hypertension: Secondary | ICD-10-CM

## 2014-01-11 ENCOUNTER — Other Ambulatory Visit (INDEPENDENT_AMBULATORY_CARE_PROVIDER_SITE_OTHER): Payer: Medicare Other

## 2014-01-11 DIAGNOSIS — I10 Essential (primary) hypertension: Secondary | ICD-10-CM

## 2014-01-11 LAB — BASIC METABOLIC PANEL
BUN: 25 mg/dL — ABNORMAL HIGH (ref 6–23)
CO2: 33 meq/L — AB (ref 19–32)
Calcium: 8.5 mg/dL (ref 8.4–10.5)
Chloride: 101 mEq/L (ref 96–112)
Creatinine, Ser: 1.5 mg/dL — ABNORMAL HIGH (ref 0.4–1.2)
GFR: 43.7 mL/min — AB (ref >60.00)
Glucose, Bld: 69 mg/dL — ABNORMAL LOW (ref 70–99)
Potassium: 3.9 mEq/L (ref 3.5–5.1)
SODIUM: 139 meq/L (ref 135–145)

## 2014-01-16 ENCOUNTER — Encounter: Payer: Self-pay | Admitting: Family Medicine

## 2014-01-23 ENCOUNTER — Ambulatory Visit (INDEPENDENT_AMBULATORY_CARE_PROVIDER_SITE_OTHER): Payer: Medicare Other | Admitting: Family Medicine

## 2014-01-23 ENCOUNTER — Encounter: Payer: Self-pay | Admitting: Family Medicine

## 2014-01-23 ENCOUNTER — Ambulatory Visit
Admission: RE | Admit: 2014-01-23 | Discharge: 2014-01-23 | Disposition: A | Discharge status: Home / Self Care | Payer: Medicare Other | Source: Ambulatory Visit | Attending: Family Medicine | Admitting: Family Medicine

## 2014-01-23 VITALS — BP 110/76 | HR 96 | Temp 97.2°F | Resp 24 | Ht 61.5 in | Wt 210.0 lb

## 2014-01-23 DIAGNOSIS — M25569 Pain in unspecified knee: Secondary | ICD-10-CM

## 2014-01-23 DIAGNOSIS — M25562 Pain in left knee: Secondary | ICD-10-CM

## 2014-01-23 NOTE — Progress Notes (Signed)
Subjective:    Patient ID: [name], female    DOB: 1922/10/17, 80 y.o.   MRN: [id number]  HPI Patient fell Thursday and landed directly on her anterior left knee. Ever since that time she reports severe pain in the left knee particularly in the anterior joint lines.  She is barely able to put weight on her knee. She has a mild effusion in the knee joint. There is no visible aponeurosis. There is no erythema or warmth. Patient is unable to perform range of motion activities due to the pain. She can barely get in and out of car to come to the doctor's office. Past Medical History  Diagnosis Date  . Hypertension   . Obesity   . Depression   . Tachy-brady syndrome   . Chronic kidney disease   . Gout    Past Surgical History  Procedure Laterality Date  . Pacemaker insertion     Current Outpatient Prescriptions on File Prior to Visit  Medication Sig Dispense Refill  . acetaminophen (TYLENOL) 650 MG CR tablet Take 650 mg by mouth every 8 (eight) hours as needed for pain.      Marland Kitchen allopurinol (ZYLOPRIM) 100 MG tablet TAKE 2 TABLETS BY MOUTH ONCE DAILY  60 tablet  4  . amLODipine (NORVASC) 5 MG tablet Take 1 tablet (5 mg total) by mouth daily.  90 tablet  3  . aspirin 81 MG tablet Take 1 tablet (81 mg total) by mouth daily.  30 tablet  11  . benzonatate (TESSALON) 100 MG capsule TAKE 1 CAPSULE BY MOUTH TWICE DAILY AS NEEDED FOR COUGH  20 capsule  0  . calcium carbonate (TUMS - DOSED IN MG ELEMENTAL CALCIUM) 500 MG chewable tablet Chew 1 tablet by mouth daily.      . carvedilol (COREG) 25 MG tablet Take 1 tablet (25 mg total) by mouth 2 (two) times daily with a meal.  60 tablet  11  . citalopram (CELEXA) 10 MG tablet TAKE 1 TABLET BY MOUTH DAILY  30 tablet  6  . famotidine (PEPCID) 20 MG tablet Take 20 mg by mouth every morning.      . ferrous sulfate 325 (65 FE) MG tablet Take 65 mg by mouth daily with breakfast.       . furosemide (LASIX) 20 MG tablet Take 1 tablet (20 mg total) by  mouth daily.  30 tablet  2  . omeprazole (PRILOSEC) 40 MG capsule TAKE 1 CAPSULE BY MOUTH DAILY  30 capsule  6  . valsartan (DIOVAN) 160 MG tablet Take 1 tablet (160 mg total) by mouth daily.  30 tablet  5  . valsartan (DIOVAN) 320 MG tablet Take 1 tablet (320 mg total) by mouth daily.  30 tablet  5   No current facility-administered medications on file prior to visit.   No Known Allergies History   Social History  . Marital Status: Widowed    Spouse Name: N/A    Number of Children: N/A  . Years of Education: N/A   Occupational History  . Not on file.   Social History Main Topics  . Smoking status: Former Research scientist (life sciences)  . Smokeless tobacco: Not on file     Comment: quit 2011  . Alcohol Use: No  . Drug Use: No  . Sexual Activity: Not on file   Other Topics Concern  . Not on file   Social History Narrative  . No narrative on file      Review of  Systems  All other systems reviewed and are negative.      Objective:   Physical Exam  Vitals reviewed. Cardiovascular: Normal rate and regular rhythm.   Pulmonary/Chest: Effort normal and breath sounds normal.  Musculoskeletal:       Left knee: She exhibits decreased range of motion, swelling, effusion and abnormal meniscus. She exhibits no ecchymosis, no deformity, no laceration, no erythema, normal alignment, no LCL laxity, normal patellar mobility and no MCL laxity. Tenderness found. Medial joint line and lateral joint line tenderness noted.          Assessment & Plan:  Left anterior knee pain - Plan: DG Knee Complete 4 Views Left  I am unable to perform a thorough exam today due to the patient's pain. Obtain an x-ray to rule out a fracture prior to vigorous examination. I believe the patient has sustained at least a sprain of the knee. If x-rays negative for fracture, the patient can return for a cortisone injection to treat the severe pain due to the sprain. I would then recommend weightbearing as tolerated and ice twice  daily for swelling. If the pain persists after one to 2 weeks I would then proceed with an MRI

## 2014-01-24 ENCOUNTER — Other Ambulatory Visit: Payer: Self-pay | Admitting: Family Medicine

## 2014-01-24 DIAGNOSIS — S82892A Other fracture of left lower leg, initial encounter for closed fracture: Secondary | ICD-10-CM

## 2014-02-14 ENCOUNTER — Other Ambulatory Visit: Payer: Self-pay | Admitting: Family Medicine

## 2014-02-14 ENCOUNTER — Encounter: Payer: Self-pay | Admitting: Internal Medicine

## 2014-02-14 ENCOUNTER — Ambulatory Visit (INDEPENDENT_AMBULATORY_CARE_PROVIDER_SITE_OTHER): Payer: Medicare Other | Admitting: Internal Medicine

## 2014-02-14 VITALS — BP 142/66 | HR 72 | Ht 62.0 in | Wt 205.0 lb

## 2014-02-14 DIAGNOSIS — R0602 Shortness of breath: Secondary | ICD-10-CM

## 2014-02-14 DIAGNOSIS — R931 Abnormal findings on diagnostic imaging of heart and coronary circulation: Secondary | ICD-10-CM

## 2014-02-14 DIAGNOSIS — I495 Sick sinus syndrome: Secondary | ICD-10-CM

## 2014-02-14 DIAGNOSIS — R0989 Other specified symptoms and signs involving the circulatory and respiratory systems: Secondary | ICD-10-CM

## 2014-02-14 DIAGNOSIS — I429 Cardiomyopathy, unspecified: Secondary | ICD-10-CM

## 2014-02-14 DIAGNOSIS — I428 Other cardiomyopathies: Secondary | ICD-10-CM

## 2014-02-14 DIAGNOSIS — Z950 Presence of cardiac pacemaker: Secondary | ICD-10-CM

## 2014-02-14 LAB — MDC_IDC_ENUM_SESS_TYPE_INCLINIC
Battery Impedance: 1900 Ohm
Battery Voltage: 2.76 V
Brady Statistic AP VP Percent: 1.7 %
Brady Statistic AS VS Percent: 81 %
Date Time Interrogation Session: 20150923144957
Implantable Pulse Generator Serial Number: 1124188
Lead Channel Impedance Value: 208 Ohm
Lead Channel Impedance Value: 278 Ohm
Lead Channel Pacing Threshold Amplitude: 1.12 V
Lead Channel Pacing Threshold Pulse Width: 0.4 ms
Lead Channel Pacing Threshold Pulse Width: 0.6 ms
Lead Channel Setting Pacing Amplitude: 2 V
Lead Channel Setting Pacing Pulse Width: 0.4 ms
Lead Channel Setting Sensing Sensitivity: 1.5 mV
MDC IDC MSMT LEADCHNL RA PACING THRESHOLD AMPLITUDE: 0.75 V
MDC IDC STAT BRADY AP VS PERCENT: 14 %
MDC IDC STAT BRADY AS VP PERCENT: 2.5 %

## 2014-02-14 MED ORDER — FUROSEMIDE 20 MG PO TABS: 40.0000 mg | ORAL_TABLET | Freq: Every day | ORAL | Status: DC

## 2014-02-14 NOTE — Patient Instructions (Addendum)
Your physician has recommended you make the following change in your medication:  1) STOP Amlodipine 2) INCREASE Furosemide to 40 mg daily   Your physician recommends that you have lab work in 2 weeks for BMET at Dr. Antony Contras office. Handwritten prescription given to you today.  Your physician recommends that you schedule a follow-up appointment in: 2 months with PA/NP  Your physician wants you to follow-up in: 6 months with device clinic.  You will receive a reminder letter in the mail two months in advance. If you don't receive a letter, please call our office to schedule the follow-up appointment.  Your physician wants you to follow-up in: 1 year with Dr. Caryl Comes.  You will receive a reminder letter in the mail two months in advance. If you don't receive a letter, please call our office to schedule the follow-up appointment.

## 2014-02-14 NOTE — Progress Notes (Signed)
,      Patient Care Team: [name], MD as PCP - General (Family Medicine)   HPI  [name] is a 80 y.o. female Seen in followup for a pacemaker implanted originally in 2002 replaced by Dr. Bishop Limbo in 2007. 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 Jude device and he underwent lead revision also in 2007   She sees Dr. Dennard Schaumann for her blood pressure. She has had problems with swelling of her feet and DOE:  She recently fell and had a" articular surface contour abnormality along a femoral condyle,  potentially representing an osteochondral fracture"  No casting was done   Echocardiogram 2013 demonstrated normal left ventricular function; interval echocardiogram 8/15 demonstrated severe LV dysfunction with an EF of 25-30% with pulmonary hypertension. At this point Diovan and Lasix were added.  Past Medical History  Diagnosis Date  . Hypertension   . Obesity   . Depression   . Tachy-brady syndrome   . Chronic kidney disease   . Gout     Past Surgical History  Procedure Laterality Date  . Pacemaker insertion      Current Outpatient Prescriptions  Medication Sig Dispense Refill  . acetaminophen (TYLENOL) 650 MG CR tablet Take 650 mg by mouth every 8 (eight) hours as needed for pain.      Marland Kitchen allopurinol (ZYLOPRIM) 100 MG tablet TAKE 2 TABLETS BY MOUTH ONCE DAILY  60 tablet  4  . amLODipine (NORVASC) 5 MG tablet Take 1 tablet (5 mg total) by mouth daily.  90 tablet  3  . aspirin 81 MG tablet Take 1 tablet (81 mg total) by mouth daily.  30 tablet  11  . calcium carbonate (TUMS - DOSED IN MG ELEMENTAL CALCIUM) 500 MG chewable tablet Chew 1 tablet by mouth daily.      . carvedilol (COREG) 25 MG tablet Take 1 tablet (25 mg total) by mouth 2 (two) times daily with a meal.  60 tablet  11  . citalopram (CELEXA) 10 MG tablet TAKE 1 TABLET BY MOUTH DAILY  30 tablet  6  . famotidine (PEPCID) 20 MG tablet Take 20 mg by mouth every morning.      Marland Kitchen FERROUS SULFATE PO Take 65 mg by mouth daily.      . furosemide  (LASIX) 20 MG tablet Take 1 tablet (20 mg total) by mouth daily.  30 tablet  2  . omeprazole (PRILOSEC) 40 MG capsule TAKE 1 CAPSULE BY MOUTH DAILY  30 capsule  6  . valsartan (DIOVAN) 160 MG tablet Take 1 tablet (160 mg total) by mouth daily.  30 tablet  5   No current facility-administered medications for this visit.    No Known Allergies  Review of Systems negative except from HPI and PMH  Physical Exam BP 142/66  Pulse 72  Ht 5\' 2"  (1.575 m)  Wt 205 lb (92.987 kg)  BMI 37.49 kg/m2 Well developed and well nourished in no acute distress HENT normal E scleral and icterus clear Neck Supple JVP flat; carotids brisk and full Clear to ausculation Device pocket well healed; without hematoma or erythema.  There is no tethering Regular rate and rhythm, no murmurs gallops or rub Soft with active bowel sounds No clubbing cyanosis 1+ Edema Alert and oriented, grossly normal motor and sensory function Skin Warm and Dry  ecg  A paced   Assessment and  Plan  Pacemaker-St. Jude The patient's device was interrogated.  The information was reviewed. No changes were made in  the programming.     HFrEF  Cardiomyopathy  Hypertension  We'll plan to augment her diuretic 20-40 mg a day. We'll discontinue the amlodipine which may be contributing to her peripheral edema. Will arrange followup in about 2 months time given her heart failure and her newly diagnosed cardiac myopathy.   We'll arrange for her metabolic profile in 2 weeks time with her PCP.

## 2014-02-19 ENCOUNTER — Encounter: Payer: Self-pay | Admitting: Internal Medicine

## 2014-02-23 ENCOUNTER — Ambulatory Visit: Payer: Medicare Other | Admitting: Family Medicine

## 2014-02-23 ENCOUNTER — Encounter: Payer: Self-pay | Admitting: Family Medicine

## 2014-02-23 ENCOUNTER — Ambulatory Visit (INDEPENDENT_AMBULATORY_CARE_PROVIDER_SITE_OTHER): Payer: Medicare Other | Admitting: Family Medicine

## 2014-02-23 VITALS — BP 132/80 | HR 84 | Temp 98.0°F | Resp 18 | Ht 61.5 in | Wt 213.0 lb

## 2014-02-23 DIAGNOSIS — I10 Essential (primary) hypertension: Secondary | ICD-10-CM

## 2014-02-23 NOTE — Progress Notes (Signed)
Subjective:    Patient ID: [name], female    DOB: 23-Jun-1922, 80 y.o.   MRN: [id number]  HPI  Patient was seen by Dr. Caryl Comes in September. He recommended that she discontinue amlodipine and increase her Lasix to 40 mg by mouth daily. He wanted her to be seen in 2 weeks for a repeat BMP after making these changes. The patient never discontinue amlodipine. Furthermore she never increased Lasix. She is currently taking 20 mg of Lasix everyday. She is here today to request blood work. She denies any chest pain. She has stable shortness of breath and dyspnea on exertion. She has trace to +1 pitting edema in her legs to her mid shin. There is no obvious fluid overload. Otherwise she is doing well. Past Medical History  Diagnosis Date  . Hypertension   . Obesity   . Depression   . Tachy-brady syndrome   . Chronic kidney disease   . Gout    Past Surgical History  Procedure Laterality Date  . Pacemaker insertion     Current Outpatient Prescriptions on File Prior to Visit  Medication Sig Dispense Refill  . acetaminophen (TYLENOL) 650 MG CR tablet Take 650 mg by mouth every 8 (eight) hours as needed for pain.      Marland Kitchen allopurinol (ZYLOPRIM) 100 MG tablet TAKE 2 TABLETS BY MOUTH ONCE DAILY  60 tablet  4  . amLODipine (NORVASC) 5 MG tablet Take 1 tablet (5 mg total) by mouth daily.  90 tablet  3  . aspirin 81 MG tablet Take 1 tablet (81 mg total) by mouth daily.  30 tablet  11  . calcium carbonate (TUMS - DOSED IN MG ELEMENTAL CALCIUM) 500 MG chewable tablet Chew 1 tablet by mouth daily.      . carvedilol (COREG) 25 MG tablet Take 1 tablet (25 mg total) by mouth 2 (two) times daily with a meal.  60 tablet  11  . citalopram (CELEXA) 10 MG tablet TAKE 1 TABLET BY MOUTH DAILY  30 tablet  6  . famotidine (PEPCID) 20 MG tablet Take 20 mg by mouth every morning.      Marland Kitchen FERROUS SULFATE PO Take 65 mg by mouth daily.      . furosemide (LASIX) 20 MG tablet Take 2 tablets (40 mg total) by mouth daily.   60 tablet  11  . omeprazole (PRILOSEC) 40 MG capsule TAKE 1 CAPSULE BY MOUTH DAILY  30 capsule  6  . valsartan (DIOVAN) 160 MG tablet Take 1 tablet (160 mg total) by mouth daily.  30 tablet  5   No current facility-administered medications on file prior to visit.   No Known Allergies History   Social History  . Marital Status: Widowed    Spouse Name: N/A    Number of Children: N/A  . Years of Education: N/A   Occupational History  . Not on file.   Social History Main Topics  . Smoking status: Former Research scientist (life sciences)  . Smokeless tobacco: Not on file     Comment: quit 2011  . Alcohol Use: No  . Drug Use: No  . Sexual Activity: Not on file   Other Topics Concern  . Not on file   Social History Narrative  . No narrative on file   No family history on file.   Review of Systems  All other systems reviewed and are negative.      Objective:   Physical Exam  Vitals reviewed. Constitutional: She appears well-developed  and well-nourished. No distress.  Neck: No JVD present.  Cardiovascular: Normal rate, regular rhythm and normal heart sounds.   Pulmonary/Chest: Effort normal and breath sounds normal. No respiratory distress. She has no wheezes. She has no rales. She exhibits no tenderness.  Abdominal: Soft. Bowel sounds are normal. She exhibits no distension and no mass. There is no tenderness. There is no rebound and no guarding.  Musculoskeletal: She exhibits edema.  Skin: She is not diaphoretic.          Assessment & Plan:  Essential hypertension  Discontinue amlodipine. Increase Lasix to 40 mg by mouth daily. Return here in 2 weeks to recheck a BMP

## 2014-02-26 ENCOUNTER — Telehealth: Payer: Self-pay | Admitting: Family Medicine

## 2014-02-26 NOTE — Telephone Encounter (Signed)
Patient would like to know if she can take 2 fluid pills a day instead of one  Please call back at [phone]

## 2014-02-27 ENCOUNTER — Telehealth: Payer: Self-pay | Admitting: Family Medicine

## 2014-02-27 ENCOUNTER — Encounter: Payer: Self-pay | Admitting: Family Medicine

## 2014-02-27 NOTE — Telephone Encounter (Signed)
SHe is supposed to be on 40 mg of lasix once a day.

## 2014-02-27 NOTE — Telephone Encounter (Signed)
Letter sent to pt to call and schedule GREENFOLDER CPE °

## 2014-02-28 NOTE — Telephone Encounter (Signed)
Pt aware to take 2 pills once a day and verbalized understanding. Pt states that she woke up with a sore throat this AM and i recommended that she gargle qid with warm salty water to help with her ST.

## 2014-03-12 ENCOUNTER — Other Ambulatory Visit: Payer: Self-pay | Admitting: Family Medicine

## 2014-03-12 NOTE — Telephone Encounter (Signed)
Pt need two week repeat labs

## 2014-03-26 ENCOUNTER — Other Ambulatory Visit: Payer: Self-pay | Admitting: Family Medicine

## 2014-03-26 ENCOUNTER — Other Ambulatory Visit: Payer: Self-pay | Admitting: Internal Medicine

## 2014-03-26 NOTE — Telephone Encounter (Signed)
Medication refilled per protocol. 

## 2014-03-26 NOTE — Telephone Encounter (Signed)
Ok to refill??  Last office visit 02/23/2014.  Last refill 12/23/2013.

## 2014-03-26 NOTE — Telephone Encounter (Signed)
Prescription sent to pharmacy.

## 2014-04-16 ENCOUNTER — Encounter: Payer: Self-pay | Admitting: Physician Assistant

## 2014-04-16 ENCOUNTER — Ambulatory Visit (INDEPENDENT_AMBULATORY_CARE_PROVIDER_SITE_OTHER): Payer: Medicare Other | Admitting: Physician Assistant

## 2014-04-16 VITALS — BP 140/92 | HR 87 | Ht 61.5 in | Wt 210.0 lb

## 2014-04-16 DIAGNOSIS — R600 Localized edema: Secondary | ICD-10-CM

## 2014-04-16 DIAGNOSIS — I429 Cardiomyopathy, unspecified: Secondary | ICD-10-CM

## 2014-04-16 DIAGNOSIS — I5022 Chronic systolic (congestive) heart failure: Secondary | ICD-10-CM

## 2014-04-16 DIAGNOSIS — I10 Essential (primary) hypertension: Secondary | ICD-10-CM

## 2014-04-16 DIAGNOSIS — N189 Chronic kidney disease, unspecified: Secondary | ICD-10-CM

## 2014-04-16 DIAGNOSIS — R609 Edema, unspecified: Secondary | ICD-10-CM

## 2014-04-16 LAB — BASIC METABOLIC PANEL WITH GFR
BUN: 28 mg/dL — ABNORMAL HIGH (ref 6–23)
CO2: 29 meq/L (ref 19–32)
Calcium: 8.9 mg/dL (ref 8.4–10.5)
Chloride: 98 meq/L (ref 96–112)
Creatinine, Ser: 1.4 mg/dL — ABNORMAL HIGH (ref 0.4–1.2)
GFR: 44.02 mL/min — ABNORMAL LOW
Glucose, Bld: 80 mg/dL (ref 70–99)
Potassium: 4.5 meq/L (ref 3.5–5.1)
Sodium: 137 meq/L (ref 135–145)

## 2014-04-16 MED ORDER — FUROSEMIDE 40 MG PO TABS: 40.0000 mg | ORAL_TABLET | Freq: Two times a day (BID) | ORAL | Status: DC

## 2014-04-16 NOTE — Patient Instructions (Signed)
LAB WORK; BMET  BRING MEDICATION BOTTLES TO YOUR FOLLOW UP APPT WITH SCOTT WEAVER, [facility]  Your physician recommends that you schedule a follow-up appointment on 05/21/14 @ 11:30 WITH SCOTT WEAVER, PAC  TAKE LASIX 40 MG DAILY  MONITOR BLOOD PRESSURE 3-4 TIMES WEEKLY; BRING YOUR READINGS TO FOLLOW UP APPT.

## 2014-04-16 NOTE — Progress Notes (Signed)
Cardiology Office Note   Date:  04/16/2014   ID:  [name], DOB Apr 02, 1923, MRN [id number]  PCP:  Odette Fraction, MD  Cardiologist/Electrophysiologist:  Dr. Virl Axe     History of Present Illness: [name] is a 80 y.o. female with hx of tachy-brady syndrome s/p PPM in 1993, cardiomyopathy, HTN.  EF in 2002 was 45%.  Echo in 2013 demonstrated normal LVF.  She was recently evaluated by her PCP and had evidence of volume excess.  She had an echo that demonstrated reduced LVF with an EF of 25-30%. ARB was added to her medical regimen. Last seen by Dr. Caryl Comes 01/2014. Lasix was adjusted. Amlodipine was discontinued. She returns for follow-up.    She saw her PCP shortly after seeing Dr. Caryl Comes.  She was still taking Amlodipine and the lower dose of Lasix.  She is here today with her aid who is a CNA. The patient's daughter, however, helps her with her medications.  Her CNA does not check the patient's BP.  The patient did go through her medications with me.  It is not entirely clear what she is taking.  Amlodipine is still on her list.  She initially told me that she is taking it bid.  Then, she told me she wasn't taking it.  She denies chest pain.  She is short of breath with moderate activities.  She is NYHA 2b-3. She denies orthopnea, PND.  Her LE edema is unchanged.  She denies syncope.   Studies:  - LHC (6/02):  Normal coronary arteries, EF 45% with anterior/inferior apical HK to AK  - Echo (6/15):  Moderate LVH, mild focal basal hypertrophy of the septum, EF 25-30%, diffuse hypokinesis, grade 1 diastolic dysfunction, mildly dilated ascending aorta, mild to moderate MR, severe LAE, mildly reduced RV function, mild RAE, moderate TR, PASP 46 mmHg  - Nuclear (5/10):  IMPRESSION:  No evidence of ischemia or infarct.  Ejection fraction 57%.   Recent Labs/Images:  10/23/2013: ALT <8; Pro B Natriuretic peptide (BNP) 4105.00* 11/27/2013: Hemoglobin 11.6* 01/11/2014: BUN 25*;  Creatinine 1.5*; Potassium 3.9; Sodium 139     Wt Readings from Last 3 Encounters:  04/16/14 210 lb (95.255 kg)  02/23/14 213 lb (96.616 kg)  02/14/14 205 lb (92.987 kg)     Past Medical History  Diagnosis Date  . Hypertension   . Obesity   . Depression   . Tachy-brady syndrome   . Chronic kidney disease   . Gout     Current Outpatient Prescriptions  Medication Sig Dispense Refill  . acetaminophen (TYLENOL) 650 MG CR tablet Take 650 mg by mouth every 8 (eight) hours as needed for pain.    Marland Kitchen allopurinol (ZYLOPRIM) 100 MG tablet TAKE 2 TABLETS BY MOUTH ONCE DAILY 60 tablet 3  . amLODipine (NORVASC) 5 MG tablet Take 1 tablet (5 mg total) by mouth daily. 90 tablet 3  . aspirin 81 MG tablet Take 1 tablet (81 mg total) by mouth daily. 30 tablet 11  . benzonatate (TESSALON) 100 MG capsule TAKE 1 CAPSULE BY MOUTH TWICE DAILY AS NEEDED FOR COUGH 30 capsule 0  . calcium carbonate (TUMS - DOSED IN MG ELEMENTAL CALCIUM) 500 MG chewable tablet Chew 1 tablet by mouth daily.    . carvedilol (COREG) 25 MG tablet TAKE 1 TABLET BY MOUTH TWICE DAILY WITH A MEAL 60 tablet 10  . citalopram (CELEXA) 10 MG tablet TAKE 1 TABLET BY MOUTH DAILY 30 tablet 6  . DIGOX 125  MCG tablet TAKE ONE TABLET BY MOUTH DAILY 30 tablet 3  . famotidine (PEPCID) 20 MG tablet Take 20 mg by mouth every morning.    Marland Kitchen FERROUS SULFATE PO Take 65 mg by mouth daily.    . furosemide (LASIX) 20 MG tablet TAKE 1 TABLET BY MOUTH DAILY AS NEEDED (TAKE ONLY IF 1 POUND WEIGHT INCREASE IN A DAY OR SEVERE SWELLING IN FEET AND LEGS) (Patient taking differently: TAKE 2 TABLET BY MOUTH DAILY AS NEEDED (TAKE ONLY IF 1 POUND WEIGHT INCREASE IN A DAY OR SEVERE SWELLING IN FEET AND LEGS)) 30 tablet 1  . omeprazole (PRILOSEC) 40 MG capsule TAKE 1 CAPSULE BY MOUTH DAILY 30 capsule 6  . valsartan (DIOVAN) 320 MG tablet Take 320 mg by mouth daily.      No current facility-administered medications for this visit.     Allergies:   Review of  patient's allergies indicates no known allergies.   Social History:  The patient  reports that she has quit smoking. She does not have any smokeless tobacco history on file. She reports that she does not drink alcohol or use illicit drugs.   Family History:  The patient's family history includes Hypertension in her mother. There is no history of Heart attack or Stroke.   ROS:  Please see the history of present illness.       All other systems reviewed and negative.    PHYSICAL EXAM: VS:  BP 140/92 mmHg  Pulse 87  Ht 5' 1.5" (1.562 m)  Wt 210 lb (95.255 kg)  BMI 39.04 kg/m2 Well nourished, well developed, in no acute distress HEENT: normal Neck: no JVDat 90 degrees Cardiac:  normal S1, S2; RRR; no murmur   Lungs:  Decreased breath sounds bilaterally, no wheezing, rhonchi or rales Abd: soft, nontender, no hepatomegaly Ext: trace bilateral LE edema Skin: warm and dry Neuro:  CNs 2-12 intact, no focal abnormalities noted  EKG:  NSR, HR 87, LAD, IVCD, PR 212 ms, no significant changes since prior tracing      ASSESSMENT AND PLAN:  1.  Cardiomyopathy:  She is not really a candidate for further aggressive invasive workup (ie cardiac cath) given her advanced age and CKD.  Medical Rx will be advanced as much as her BP will tolerate. It is not clear exactly how she is taking her medications.  We have updated her list and I have asked her to review her medications at home.  She will call with any corrections.    -  Continue beta blocker, ARB.  I do not think she is a candidate for Spironolactone given CKD.  Consider adding Hydralazine if BP remains elevated. 2.  Chronic systolic CHF (congestive heart failure):  Continue current dose of Lasix.  Volume appears stable.    -  Check BMET today.  3.  Essential hypertension:  BP near goal.  I have asked her CNA to record her BP 3-4 days a week and bring to her next appointment.   4.  Peripheral edema:  Controlled.  Continue current Rx. 5.  Chronic  kidney disease, unspecified stage:  Check BMET today.   Disposition:   FU with me 1 month.   Signed, Versie Starks, MHS 04/16/2014 3:09 PM    [facility] [address], [place], [place]  [address] Phone: [phone]; Fax: [phone]

## 2014-04-17 ENCOUNTER — Telehealth: Payer: Self-pay | Admitting: *Deleted

## 2014-04-17 NOTE — Telephone Encounter (Signed)
pt notified about lab results with verbal understanding  

## 2014-04-23 ENCOUNTER — Encounter: Payer: Medicare Other | Admitting: Family Medicine

## 2014-05-14 ENCOUNTER — Other Ambulatory Visit: Payer: Self-pay | Admitting: Family Medicine

## 2014-05-14 NOTE — Telephone Encounter (Signed)
Medication refilled per protocol. 

## 2014-05-21 ENCOUNTER — Encounter: Payer: Medicare Other | Admitting: Physician Assistant

## 2014-05-21 NOTE — Progress Notes (Deleted)
Cardiology Office Note   Date:  05/21/2014   ID:  [name], DOB Sep 10, 1922, MRN [id number]  PCP:  Odette Fraction, MD  Cardiologist/Electrophysiologist:  Dr. Virl Axe     History of Present Illness: [name] is a 80 y.o. female with hx of tachy-brady syndrome s/p PPM in 1993, cardiomyopathy, HTN.  EF in 2002 was 45%.  Echo in 2013 demonstrated normal LVF.  She was recently evaluated by her PCP and had evidence of volume excess.  She had an echo that demonstrated reduced LVF with an EF of 25-30%. It was felt that medical Rx was the most appropriate given her advanced age and CKD.  ARB was added to her medical regimen. Last seen by Dr. Caryl Comes 01/2014. Lasix was adjusted. Amlodipine was discontinued.   I saw her in FU 04/16/14.  It was not clear what medications she was taking at that time.  Amlodipine was still on her list.  We asked her CNA to confirm her medications and to get back with [place] regarding her exact list.  She returns for FU.  ***    Studies:  - LHC (6/02):  Normal coronary arteries, EF 45% with anterior/inferior apical HK to AK  - Echo (6/15):  Moderate LVH, mild focal basal hypertrophy of the septum, EF 25-30%, diffuse hypokinesis, grade 1 diastolic dysfunction, mildly dilated ascending aorta, mild to moderate MR, severe LAE, mildly reduced RV function, mild RAE, moderate TR, PASP 46 mmHg  - Nuclear (5/10):  IMPRESSION:  No evidence of ischemia or infarct.  Ejection fraction 57%.   Recent Labs/Images:  10/23/2013: ALT <8; Pro B Natriuretic peptide (BNP) 4105.00* 11/27/2013: Hemoglobin 11.6* 04/16/2014: BUN 28*; Creatinine 1.4*; Potassium 4.5; Sodium 137     Wt Readings from Last 3 Encounters:  04/16/14 210 lb (95.255 kg)  02/23/14 213 lb (96.616 kg)  02/14/14 205 lb (92.987 kg)     Past Medical History  Diagnosis Date  . Hypertension   . Obesity   . Depression   . Tachy-brady syndrome   . Chronic kidney disease   . Gout     Current Outpatient  Prescriptions  Medication Sig Dispense Refill  . acetaminophen (TYLENOL) 650 MG CR tablet Take 650 mg by mouth every 8 (eight) hours as needed for pain.    Marland Kitchen allopurinol (ZYLOPRIM) 100 MG tablet TAKE 2 TABLETS BY MOUTH ONCE DAILY 60 tablet 3  . aspirin 81 MG tablet Take 1 tablet (81 mg total) by mouth daily. 30 tablet 11  . benzonatate (TESSALON) 100 MG capsule TAKE 1 CAPSULE BY MOUTH TWICE DAILY AS NEEDED FOR COUGH 30 capsule 0  . calcium carbonate (TUMS - DOSED IN MG ELEMENTAL CALCIUM) 500 MG chewable tablet Chew 1 tablet by mouth daily.    . carvedilol (COREG) 25 MG tablet TAKE 1 TABLET BY MOUTH TWICE DAILY WITH A MEAL 60 tablet 10  . citalopram (CELEXA) 10 MG tablet TAKE 1 TABLET BY MOUTH DAILY 30 tablet 6  . DIGOX 125 MCG tablet TAKE ONE TABLET BY MOUTH DAILY 30 tablet 3  . famotidine (PEPCID) 20 MG tablet Take 20 mg by mouth every morning.    Marland Kitchen FERROUS SULFATE PO Take 65 mg by mouth daily.    . furosemide (LASIX) 40 MG tablet Take 1 tablet (40 mg total) by mouth 2 (two) times daily.    Marland Kitchen omeprazole (PRILOSEC) 40 MG capsule TAKE 1 CAPSULE BY MOUTH DAILY 30 capsule 6  . valsartan (DIOVAN) 320 MG tablet TAKE  1 TABLET BY MOUTH DAILY 30 tablet 5   No current facility-administered medications for this visit.     Allergies:   Review of patient's allergies indicates no known allergies.   Social History:  The patient  reports that she has quit smoking. She does not have any smokeless tobacco history on file. She reports that she does not drink alcohol or use illicit drugs.   Family History:  The patient's family history includes Hypertension in her mother. There is no history of Heart attack or Stroke.   ROS:  Please see the history of present illness.  ***     All other systems reviewed and negative.    PHYSICAL EXAM: VS:  There were no vitals taken for this visit. Well nourished, well developed, in no acute distress HEENT: normal Neck: no JVDat 90 degrees Cardiac:  normal S1, S2;  RRR; no murmur   Lungs:  Decreased breath sounds bilaterally, no wheezing, rhonchi or rales Abd: soft, nontender, no hepatomegaly Ext: trace bilateral LE edema Skin: warm and dry Neuro:  CNs 2-12 intact, no focal abnormalities noted  EKG:  ***   ASSESSMENT AND PLAN:  1.  Cardiomyopathy:  She is not really a candidate for further aggressive invasive workup (ie cardiac cath) given her advanced age and CKD.  Medical Rx will be advanced as much as her BP will tolerate.  *** 2.  Chronic systolic CHF (congestive heart failure):  *** 3.  Essential hypertension:  *** 4.  Peripheral edema:  ***  5.  Chronic kidney disease:  ***  Recent labs:  04/16/2014: Creatinine 1.4*; Potassium 4.5    Disposition:   FU with ***   Signed, Richardson Dopp, PA-C, MHS 05/21/2014 9:06 AM    [facility] [place], [place], [place]  [address] Phone: [phone]; Fax: [phone]

## 2014-05-22 ENCOUNTER — Encounter: Payer: Medicare Other | Admitting: Family Medicine

## 2014-05-23 NOTE — Progress Notes (Signed)
This encounter was created in error - please disregard.

## 2014-05-26 DIAGNOSIS — E669 Obesity, unspecified: Secondary | ICD-10-CM | POA: Diagnosis not present

## 2014-05-26 DIAGNOSIS — F329 Major depressive disorder, single episode, unspecified: Secondary | ICD-10-CM | POA: Diagnosis not present

## 2014-05-26 DIAGNOSIS — I10 Essential (primary) hypertension: Secondary | ICD-10-CM | POA: Diagnosis not present

## 2014-05-27 DIAGNOSIS — I10 Essential (primary) hypertension: Secondary | ICD-10-CM | POA: Diagnosis not present

## 2014-05-27 DIAGNOSIS — F329 Major depressive disorder, single episode, unspecified: Secondary | ICD-10-CM | POA: Diagnosis not present

## 2014-05-27 DIAGNOSIS — E669 Obesity, unspecified: Secondary | ICD-10-CM | POA: Diagnosis not present

## 2014-05-31 ENCOUNTER — Ambulatory Visit (INDEPENDENT_AMBULATORY_CARE_PROVIDER_SITE_OTHER): Payer: 59 | Admitting: Family Medicine

## 2014-05-31 ENCOUNTER — Encounter: Payer: Self-pay | Admitting: Family Medicine

## 2014-05-31 VITALS — BP 120/60 | HR 72 | Temp 98.6°F | Resp 18 | Ht 61.5 in | Wt 212.0 lb

## 2014-05-31 DIAGNOSIS — Z0000 Encounter for general adult medical examination without abnormal findings: Secondary | ICD-10-CM

## 2014-05-31 DIAGNOSIS — I509 Heart failure, unspecified: Secondary | ICD-10-CM | POA: Insufficient documentation

## 2014-05-31 NOTE — Progress Notes (Signed)
Subjective:    Patient ID: [name], female    DOB: 1922-11-24, 81 y.o.   MRN: [id number]  HPI Patient is here today for a complete physical exam. Of note she has significant congestive heart failure which is [place] class III. She saw her cardiologist in November and they recommended avoiding spironolactone given her chronic kidney disease. Fortunately the patient's weight is stable from when I last saw her in October. She is not weighing herself on a daily basis. She denies any chest pain shortness of breath or dyspnea on exertion. Today her blood pressure is outstanding at 120/60. She is already on beta blocker, ARB, and a diuretic. At this point she is on maximum medical therapy for congestive heart failure. Due to her age and her comorbidities they are not recommending aggressive surgical intervention Past Medical History  Diagnosis Date  . Hypertension   . Obesity   . Depression   . Tachy-brady syndrome   . Chronic kidney disease   . Gout   . CHF NYHA class III (symptoms with mildly strenuous activities)    Past Surgical History  Procedure Laterality Date  . Pacemaker insertion     Current Outpatient Prescriptions on File Prior to Visit  Medication Sig Dispense Refill  . acetaminophen (TYLENOL) 650 MG CR tablet Take 650 mg by mouth every 8 (eight) hours as needed for pain.    Marland Kitchen allopurinol (ZYLOPRIM) 100 MG tablet TAKE 2 TABLETS BY MOUTH ONCE DAILY 60 tablet 3  . aspirin 81 MG tablet Take 1 tablet (81 mg total) by mouth daily. 30 tablet 11  . calcium carbonate (TUMS - DOSED IN MG ELEMENTAL CALCIUM) 500 MG chewable tablet Chew 1 tablet by mouth daily.    . carvedilol (COREG) 25 MG tablet TAKE 1 TABLET BY MOUTH TWICE DAILY WITH A MEAL 60 tablet 10  . citalopram (CELEXA) 10 MG tablet TAKE 1 TABLET BY MOUTH DAILY 30 tablet 6  . DIGOX 125 MCG tablet TAKE ONE TABLET BY MOUTH DAILY 30 tablet 3  . famotidine (PEPCID) 20 MG tablet Take 20 mg by mouth every morning.    Marland Kitchen FERROUS  SULFATE PO Take 65 mg by mouth daily.    . furosemide (LASIX) 40 MG tablet Take 1 tablet (40 mg total) by mouth 2 (two) times daily.    Marland Kitchen omeprazole (PRILOSEC) 40 MG capsule TAKE 1 CAPSULE BY MOUTH DAILY 30 capsule 6  . valsartan (DIOVAN) 320 MG tablet TAKE 1 TABLET BY MOUTH DAILY 30 tablet 5   No current facility-administered medications on file prior to visit.   No Known Allergies History   Social History  . Marital Status: Widowed    Spouse Name: N/A    Number of Children: N/A  . Years of Education: N/A   Occupational History  . Not on file.   Social History Main Topics  . Smoking status: Former Research scientist (life sciences)  . Smokeless tobacco: Not on file     Comment: quit 2011  . Alcohol Use: No  . Drug Use: No  . Sexual Activity: Not on file   Other Topics Concern  . Not on file   Social History Narrative   Family History  Problem Relation Age of Onset  . Heart attack Neg Hx   . Stroke Neg Hx   . Hypertension Mother       Review of Systems  All other systems reviewed and are negative.      Objective:   Physical Exam  Constitutional: She is oriented to person, place, and time. She appears well-developed and well-nourished. No distress.  HENT:  Head: Normocephalic and atraumatic.  Right Ear: External ear normal.  Left Ear: External ear normal.  Nose: Nose normal.  Mouth/Throat: Oropharynx is clear and moist. No oropharyngeal exudate.  Eyes: Conjunctivae and EOM are normal. Pupils are equal, round, and reactive to light. Right eye exhibits no discharge. Left eye exhibits no discharge. No scleral icterus.  Neck: Normal range of motion. Neck supple. No JVD present. No thyromegaly present.  Cardiovascular: Normal rate, regular rhythm and intact distal pulses.  Exam reveals no gallop and no friction rub.   No murmur heard. Pulmonary/Chest: Effort normal and breath sounds normal. No respiratory distress. She has no wheezes. She has no rales. She exhibits no tenderness.    Abdominal: Soft. Bowel sounds are normal. She exhibits no distension and no mass. There is no tenderness. There is no rebound and no guarding.  Musculoskeletal: Normal range of motion. She exhibits tenderness. She exhibits no edema.  Lymphadenopathy:    She has no cervical adenopathy.  Neurological: She is alert and oriented to person, place, and time. She has normal reflexes. She displays normal reflexes. No cranial nerve deficit. She exhibits normal muscle tone. Coordination normal.  Skin: Skin is warm. No rash noted. She is not diaphoretic. No erythema. No pallor.  Psychiatric: She has a normal mood and affect. Her behavior is normal. Judgment and thought content normal.  Vitals reviewed.  patient has no teeth.  She is walking with a walker        Assessment & Plan:  Routine general medical examination at a health care facility  Given her age and her multiple medical comorbidities, I recommended against a Pap smear, colonoscopy, or mammogram. Her immunizations are up-to-date except for her flu shot. The patient received her annual flu shot today. I recommended she return fasting for a CBC, CMP, fasting lipid panel, and TSH. Blood pressures well controlled. I recommended the patient check her weight every day along with her blood pressure and notify us immediately if her blood pressure in the 140/90 or if her weight rises more than 2 pounds.

## 2014-06-04 DIAGNOSIS — E669 Obesity, unspecified: Secondary | ICD-10-CM | POA: Diagnosis not present

## 2014-06-04 DIAGNOSIS — F329 Major depressive disorder, single episode, unspecified: Secondary | ICD-10-CM | POA: Diagnosis not present

## 2014-06-04 DIAGNOSIS — I10 Essential (primary) hypertension: Secondary | ICD-10-CM | POA: Diagnosis not present

## 2014-06-05 ENCOUNTER — Other Ambulatory Visit: Payer: 59

## 2014-06-05 DIAGNOSIS — I10 Essential (primary) hypertension: Secondary | ICD-10-CM

## 2014-06-05 DIAGNOSIS — I509 Heart failure, unspecified: Secondary | ICD-10-CM | POA: Diagnosis not present

## 2014-06-05 DIAGNOSIS — F329 Major depressive disorder, single episode, unspecified: Secondary | ICD-10-CM | POA: Diagnosis not present

## 2014-06-05 DIAGNOSIS — N189 Chronic kidney disease, unspecified: Secondary | ICD-10-CM

## 2014-06-05 DIAGNOSIS — Z79899 Other long term (current) drug therapy: Secondary | ICD-10-CM | POA: Diagnosis not present

## 2014-06-05 DIAGNOSIS — E669 Obesity, unspecified: Secondary | ICD-10-CM | POA: Diagnosis not present

## 2014-06-05 LAB — LIPID PANEL
Cholesterol: 148 mg/dL (ref 0–200)
HDL: 47 mg/dL (ref >39)
LDL Cholesterol: 88 mg/dL (ref 0–99)
TRIGLYCERIDES: 66 mg/dL (ref <150)
Total CHOL/HDL Ratio: 3.1 Ratio
VLDL: 13 mg/dL (ref 0–40)

## 2014-06-05 LAB — CBC WITH DIFFERENTIAL/PLATELET
BASOS ABS: 0.1 10*3/uL (ref 0.0–0.1)
BASOS PCT: 1 % (ref 0–1)
EOS ABS: 0.3 10*3/uL (ref 0.0–0.7)
Eosinophils Relative: 6 % — ABNORMAL HIGH (ref 0–5)
HCT: 36.7 % (ref 36.0–46.0)
HEMOGLOBIN: 11.5 g/dL — AB (ref 12.0–15.0)
LYMPHS ABS: 2 10*3/uL (ref 0.7–4.0)
Lymphocytes Relative: 35 % (ref 12–46)
MCH: 29.3 pg (ref 26.0–34.0)
MCHC: 31.3 g/dL (ref 30.0–36.0)
MCV: 93.4 fL (ref 78.0–100.0)
MPV: 11 fL (ref 8.6–12.4)
Monocytes Absolute: 0.5 10*3/uL (ref 0.1–1.0)
Monocytes Relative: 9 % (ref 3–12)
NEUTROS PCT: 49 % (ref 43–77)
Neutro Abs: 2.7 10*3/uL (ref 1.7–7.7)
PLATELETS: 200 10*3/uL (ref 150–400)
RBC: 3.93 MIL/uL (ref 3.87–5.11)
RDW: 14.7 % (ref 11.5–15.5)
WBC: 5.6 10*3/uL (ref 4.0–10.5)

## 2014-06-05 LAB — TSH: TSH: 0.719 u[IU]/mL (ref 0.350–4.500)

## 2014-06-05 LAB — COMPLETE METABOLIC PANEL WITH GFR
AST: 10 U/L (ref 0–37)
Albumin: 3.3 g/dL — ABNORMAL LOW (ref 3.5–5.2)
Alkaline Phosphatase: 107 U/L (ref 39–117)
BILIRUBIN TOTAL: 0.5 mg/dL (ref 0.2–1.2)
BUN: 37 mg/dL — ABNORMAL HIGH (ref 6–23)
CALCIUM: 8.9 mg/dL (ref 8.4–10.5)
CHLORIDE: 104 meq/L (ref 96–112)
CO2: 30 mEq/L (ref 19–32)
CREATININE: 1.62 mg/dL — AB (ref 0.50–1.10)
GFR, Est African American: 32 mL/min — ABNORMAL LOW
GFR, Est Non African American: 28 mL/min — ABNORMAL LOW
Glucose, Bld: 66 mg/dL — ABNORMAL LOW (ref 70–99)
Potassium: 5 mEq/L (ref 3.5–5.3)
Sodium: 141 mEq/L (ref 135–145)
Total Protein: 7.3 g/dL (ref 6.0–8.3)

## 2014-06-06 DIAGNOSIS — F329 Major depressive disorder, single episode, unspecified: Secondary | ICD-10-CM | POA: Diagnosis not present

## 2014-06-06 DIAGNOSIS — E669 Obesity, unspecified: Secondary | ICD-10-CM | POA: Diagnosis not present

## 2014-06-06 DIAGNOSIS — I10 Essential (primary) hypertension: Secondary | ICD-10-CM | POA: Diagnosis not present

## 2014-06-07 ENCOUNTER — Encounter: Payer: Self-pay | Admitting: Family Medicine

## 2014-06-07 DIAGNOSIS — E669 Obesity, unspecified: Secondary | ICD-10-CM | POA: Diagnosis not present

## 2014-06-07 DIAGNOSIS — F329 Major depressive disorder, single episode, unspecified: Secondary | ICD-10-CM | POA: Diagnosis not present

## 2014-06-07 DIAGNOSIS — I10 Essential (primary) hypertension: Secondary | ICD-10-CM | POA: Diagnosis not present

## 2014-06-08 DIAGNOSIS — E669 Obesity, unspecified: Secondary | ICD-10-CM | POA: Diagnosis not present

## 2014-06-08 DIAGNOSIS — I10 Essential (primary) hypertension: Secondary | ICD-10-CM | POA: Diagnosis not present

## 2014-06-08 DIAGNOSIS — F329 Major depressive disorder, single episode, unspecified: Secondary | ICD-10-CM | POA: Diagnosis not present

## 2014-06-09 DIAGNOSIS — F329 Major depressive disorder, single episode, unspecified: Secondary | ICD-10-CM | POA: Diagnosis not present

## 2014-06-09 DIAGNOSIS — E669 Obesity, unspecified: Secondary | ICD-10-CM | POA: Diagnosis not present

## 2014-06-09 DIAGNOSIS — I10 Essential (primary) hypertension: Secondary | ICD-10-CM | POA: Diagnosis not present

## 2014-06-10 DIAGNOSIS — I10 Essential (primary) hypertension: Secondary | ICD-10-CM | POA: Diagnosis not present

## 2014-06-10 DIAGNOSIS — E669 Obesity, unspecified: Secondary | ICD-10-CM | POA: Diagnosis not present

## 2014-06-10 DIAGNOSIS — F329 Major depressive disorder, single episode, unspecified: Secondary | ICD-10-CM | POA: Diagnosis not present

## 2014-06-12 ENCOUNTER — Ambulatory Visit: Payer: Medicare Other | Admitting: Physician Assistant

## 2014-06-12 DIAGNOSIS — F329 Major depressive disorder, single episode, unspecified: Secondary | ICD-10-CM | POA: Diagnosis not present

## 2014-06-12 DIAGNOSIS — E669 Obesity, unspecified: Secondary | ICD-10-CM | POA: Diagnosis not present

## 2014-06-12 DIAGNOSIS — I10 Essential (primary) hypertension: Secondary | ICD-10-CM | POA: Diagnosis not present

## 2014-06-13 DIAGNOSIS — E669 Obesity, unspecified: Secondary | ICD-10-CM | POA: Diagnosis not present

## 2014-06-13 DIAGNOSIS — F329 Major depressive disorder, single episode, unspecified: Secondary | ICD-10-CM | POA: Diagnosis not present

## 2014-06-13 DIAGNOSIS — I10 Essential (primary) hypertension: Secondary | ICD-10-CM | POA: Diagnosis not present

## 2014-06-14 DIAGNOSIS — F329 Major depressive disorder, single episode, unspecified: Secondary | ICD-10-CM | POA: Diagnosis not present

## 2014-06-14 DIAGNOSIS — E669 Obesity, unspecified: Secondary | ICD-10-CM | POA: Diagnosis not present

## 2014-06-14 DIAGNOSIS — I10 Essential (primary) hypertension: Secondary | ICD-10-CM | POA: Diagnosis not present

## 2014-06-15 DIAGNOSIS — F329 Major depressive disorder, single episode, unspecified: Secondary | ICD-10-CM | POA: Diagnosis not present

## 2014-06-15 DIAGNOSIS — I10 Essential (primary) hypertension: Secondary | ICD-10-CM | POA: Diagnosis not present

## 2014-06-15 DIAGNOSIS — E669 Obesity, unspecified: Secondary | ICD-10-CM | POA: Diagnosis not present

## 2014-06-16 DIAGNOSIS — F329 Major depressive disorder, single episode, unspecified: Secondary | ICD-10-CM | POA: Diagnosis not present

## 2014-06-16 DIAGNOSIS — E669 Obesity, unspecified: Secondary | ICD-10-CM | POA: Diagnosis not present

## 2014-06-16 DIAGNOSIS — I10 Essential (primary) hypertension: Secondary | ICD-10-CM | POA: Diagnosis not present

## 2014-06-17 DIAGNOSIS — F329 Major depressive disorder, single episode, unspecified: Secondary | ICD-10-CM | POA: Diagnosis not present

## 2014-06-17 DIAGNOSIS — E669 Obesity, unspecified: Secondary | ICD-10-CM | POA: Diagnosis not present

## 2014-06-17 DIAGNOSIS — I10 Essential (primary) hypertension: Secondary | ICD-10-CM | POA: Diagnosis not present

## 2014-06-18 ENCOUNTER — Other Ambulatory Visit: Payer: Self-pay | Admitting: Family Medicine

## 2014-06-18 DIAGNOSIS — F329 Major depressive disorder, single episode, unspecified: Secondary | ICD-10-CM | POA: Diagnosis not present

## 2014-06-18 DIAGNOSIS — I10 Essential (primary) hypertension: Secondary | ICD-10-CM | POA: Diagnosis not present

## 2014-06-18 DIAGNOSIS — E669 Obesity, unspecified: Secondary | ICD-10-CM | POA: Diagnosis not present

## 2014-06-19 DIAGNOSIS — I10 Essential (primary) hypertension: Secondary | ICD-10-CM | POA: Diagnosis not present

## 2014-06-19 DIAGNOSIS — E669 Obesity, unspecified: Secondary | ICD-10-CM | POA: Diagnosis not present

## 2014-06-19 DIAGNOSIS — F329 Major depressive disorder, single episode, unspecified: Secondary | ICD-10-CM | POA: Diagnosis not present

## 2014-06-19 NOTE — Telephone Encounter (Signed)
req for PRN lasix.  Denied.  Pt on 40 mg BID

## 2014-06-19 NOTE — Telephone Encounter (Signed)
Medication refilled per protocol. 

## 2014-06-20 DIAGNOSIS — F329 Major depressive disorder, single episode, unspecified: Secondary | ICD-10-CM | POA: Diagnosis not present

## 2014-06-20 DIAGNOSIS — E669 Obesity, unspecified: Secondary | ICD-10-CM | POA: Diagnosis not present

## 2014-06-20 DIAGNOSIS — I10 Essential (primary) hypertension: Secondary | ICD-10-CM | POA: Diagnosis not present

## 2014-06-21 DIAGNOSIS — I10 Essential (primary) hypertension: Secondary | ICD-10-CM | POA: Diagnosis not present

## 2014-06-21 DIAGNOSIS — F329 Major depressive disorder, single episode, unspecified: Secondary | ICD-10-CM | POA: Diagnosis not present

## 2014-06-21 DIAGNOSIS — E669 Obesity, unspecified: Secondary | ICD-10-CM | POA: Diagnosis not present

## 2014-06-22 DIAGNOSIS — E669 Obesity, unspecified: Secondary | ICD-10-CM | POA: Diagnosis not present

## 2014-06-22 DIAGNOSIS — F329 Major depressive disorder, single episode, unspecified: Secondary | ICD-10-CM | POA: Diagnosis not present

## 2014-06-22 DIAGNOSIS — I10 Essential (primary) hypertension: Secondary | ICD-10-CM | POA: Diagnosis not present

## 2014-06-23 DIAGNOSIS — F329 Major depressive disorder, single episode, unspecified: Secondary | ICD-10-CM | POA: Diagnosis not present

## 2014-06-23 DIAGNOSIS — E669 Obesity, unspecified: Secondary | ICD-10-CM | POA: Diagnosis not present

## 2014-06-23 DIAGNOSIS — I10 Essential (primary) hypertension: Secondary | ICD-10-CM | POA: Diagnosis not present

## 2014-06-24 DIAGNOSIS — I10 Essential (primary) hypertension: Secondary | ICD-10-CM | POA: Diagnosis not present

## 2014-06-24 DIAGNOSIS — E669 Obesity, unspecified: Secondary | ICD-10-CM | POA: Diagnosis not present

## 2014-06-24 DIAGNOSIS — F329 Major depressive disorder, single episode, unspecified: Secondary | ICD-10-CM | POA: Diagnosis not present

## 2014-06-25 ENCOUNTER — Ambulatory Visit (INDEPENDENT_AMBULATORY_CARE_PROVIDER_SITE_OTHER): Payer: Medicare Other | Admitting: Nurse Practitioner

## 2014-06-25 ENCOUNTER — Encounter: Payer: Self-pay | Admitting: Nurse Practitioner

## 2014-06-25 ENCOUNTER — Ambulatory Visit: Payer: Medicaid Other | Admitting: Physician Assistant

## 2014-06-25 VITALS — BP 134/72 | HR 71 | Ht 61.5 in | Wt 208.0 lb

## 2014-06-25 DIAGNOSIS — N184 Chronic kidney disease, stage 4 (severe): Secondary | ICD-10-CM | POA: Insufficient documentation

## 2014-06-25 DIAGNOSIS — I5022 Chronic systolic (congestive) heart failure: Secondary | ICD-10-CM

## 2014-06-25 DIAGNOSIS — I10 Essential (primary) hypertension: Secondary | ICD-10-CM | POA: Diagnosis not present

## 2014-06-25 DIAGNOSIS — N183 Chronic kidney disease, stage 3 unspecified: Secondary | ICD-10-CM

## 2014-06-25 DIAGNOSIS — I429 Cardiomyopathy, unspecified: Secondary | ICD-10-CM | POA: Diagnosis not present

## 2014-06-25 DIAGNOSIS — E669 Obesity, unspecified: Secondary | ICD-10-CM | POA: Diagnosis not present

## 2014-06-25 DIAGNOSIS — F329 Major depressive disorder, single episode, unspecified: Secondary | ICD-10-CM | POA: Diagnosis not present

## 2014-06-25 NOTE — Progress Notes (Signed)
Patient Name: [name] Date of Encounter: 06/25/2014  Primary Care Provider:  Odette Fraction, MD Primary Cardiologist:  Olin Pia, MD   Chief Complaint  81 year old female with a history of cardiomyopathy and tachybradycardia syndrome who presents for follow-up.  Past Medical History   Past Medical History  Diagnosis Date  . Hypertension   . Obesity   . Depression   . Tachy-brady syndrome     a. 03/2006 s/p SJM 5356 Verity ADx XLDR DC PPM (ser#: [id number]).  . CKD (chronic kidney disease), stage III   . Gout   . Chronic systolic CHF (congestive heart failure)     a. 10/2013 Echo: EF 25-30%, diff HK, Gr 1 DD, triv AI, mild to mod MR, dev dil LA, mild RV dysfxn, mildly dil RA, mod TR.  . Cardiomyopathy     a. ? ischemic vs non-ischemic;  b. 09/2013 Echo: EF 25-30%.   Past Surgical History  Procedure Laterality Date  . Pacemaker insertion      Allergies  No Known Allergies  HPI  81 year old female with the above problem list. She has a history of cardiomyopathy with an EF of 25-30% by echocardiogram in June 2015. With advanced age and also stage 3-4 chronic kidney disease, she has not undergone ischemic evaluation. She was last seen in clinic in November at which time she was doing well. Since then, she continues to do well. Though she experiences some dyspnea on exertion, her weight has been stable and she has not had any lower extremity edema or change in her appetite. She does not weigh herself daily but knows that she should. She is very careful about her salt intake. She denies chest pain, palpitations, PND, orthopnea, dizziness, syncope, or early satiety. She only takes Lasix as needed.  Home Medications  Prior to Admission medications   Medication Sig Start Date End Date Taking? Authorizing Provider  acetaminophen (TYLENOL) 650 MG CR tablet Take 650 mg by mouth every 8 (eight) hours as needed for pain.   Yes Historical Provider, MD  allopurinol (ZYLOPRIM) 100 MG  tablet TAKE 2 TABLETS BY MOUTH ONCE DAILY 03/26/14  Yes [name], MD  aspirin 81 MG tablet Take 1 tablet (81 mg total) by mouth daily. 04/27/13  Yes Deboraha Sprang, MD  calcium carbonate (TUMS - DOSED IN MG ELEMENTAL CALCIUM) 500 MG chewable tablet Chew 1 tablet by mouth daily.   Yes Historical Provider, MD  carvedilol (COREG) 25 MG tablet TAKE 1 TABLET BY MOUTH TWICE DAILY WITH A MEAL 03/28/14  Yes Deboraha Sprang, MD  citalopram (CELEXA) 10 MG tablet TAKE 1 TABLET BY MOUTH DAILY 06/19/14  Yes [name], MD  [place] 125 MCG tablet TAKE ONE TABLET BY MOUTH DAILY 03/26/14  Yes [name], MD  famotidine (PEPCID) 20 MG tablet Take 20 mg by mouth every morning.   Yes Historical Provider, MD  FERROUS SULFATE PO Take 65 mg by mouth daily.   Yes Historical Provider, MD  furosemide (LASIX) 40 MG tablet Take 1 tablet (40 mg total) by mouth 2 (two) times daily. Patient taking differently: Take 40 mg by mouth as needed.  04/16/14 04/16/15 Yes Scott T Weaver, PA-C  omeprazole (PRILOSEC) 40 MG capsule TAKE 1 CAPSULE BY MOUTH DAILY   Yes [name], MD  valsartan (DIOVAN) 320 MG tablet TAKE 1 TABLET BY MOUTH DAILY 05/14/14  Yes [name], MD    Review of Systems  As above, she does  have some degree of chronic dyspnea on exertion. She ambulates using a cane.  She denies chest pain, palpitations, pnd, orthopnea, n, v, dizziness, syncope, edema, weight gain, or early satiety.  All other systems reviewed and are otherwise negative except as noted above.  Physical Exam  VS:  BP 134/72 mmHg  Pulse 71  Ht 5' 1.5" (1.562 m)  Wt 208 lb (94.348 kg)  BMI 38.67 kg/m2 , BMI Body mass index is 38.67 kg/(m^2). GEN: Well nourished, well developed, in no acute distress. HEENT: normal. Neck: Supple, no JVD, carotid bruits, or masses. Cardiac: RRR, no murmurs, rubs, or gallops. No clubbing, cyanosis, edema.  Radials/DP/PT 2+ and equal bilaterally.  Respiratory:  Respirations regular and  unlabored, somewhat diminished but otw clear to auscultation bilaterally. GI: Soft, nontender, nondistended, BS + x 4. MS: no deformity or atrophy. Skin: warm and dry, no rash. Neuro:  Strength and sensation are intact. Psych: Normal affect.  Accessory Clinical Findings  Lab Results  Component Value Date   CREATININE 1.62* 06/05/2014   BUN 37* 06/05/2014   NA 141 06/05/2014   K 5.0 06/05/2014   CL 104 06/05/2014   CO2 30 06/05/2014    Lab Results  Component Value Date   CHOL 148 06/05/2014   HDL 47 06/05/2014   LDLCALC 88 06/05/2014   TRIG 66 06/05/2014   CHOLHDL 3.1 06/05/2014    Lab Results  Component Value Date   WBC 5.6 06/05/2014   HGB 11.5* 06/05/2014   HCT 36.7 06/05/2014   MCV 93.4 06/05/2014   PLT 200 06/05/2014   shortness Lab Results  Component Value Date   TSH 0.719 06/05/2014    Assessment & Plan  1.  Chronic combined systolic and diastolic congestive heart failure/cardiac myopathy: Patient appears to be euvolemic on exam today. She is not weighing herself regularly at home but her weight is down 4 pounds since her most recent office visit on January 7. She does have chronic dyspnea on exertion but otherwise volume is stable. She is only using Lasix as needed. She remains on beta blocker and ARB therapy and her creatinine was relatively stable upon recent check on January 12. Given mild hyperkalemia, she is not a candidate for spironolactone therapy.  2. Tachybradycardia syndrome: Status post permanent pacemaker with normal device function upon most recent evaluation.   3. Stage 3-4 chronic kidney disease: Creatinine 1.62 on January 12. She is on ARB therapy.  4. Hypertension: Stable on beta blocker and ARB.  Disposition: Follow-up with Dr. Caryl Comes in 3 months or sooner if necessary.  Murray Hodgkins, NP 06/25/2014, 2:34 PM

## 2014-06-25 NOTE — Patient Instructions (Signed)
Your physician recommends that you continue on your current medications as directed. Please refer to the Current Medication list given to you today.    Your physician wants you to follow-up in:  In [place] will receive a reminder letter in the mail two months in advance. If you don't receive a letter, please call our office to schedule the follow-up appointment.

## 2014-06-26 DIAGNOSIS — E669 Obesity, unspecified: Secondary | ICD-10-CM | POA: Diagnosis not present

## 2014-06-26 DIAGNOSIS — I10 Essential (primary) hypertension: Secondary | ICD-10-CM | POA: Diagnosis not present

## 2014-06-26 DIAGNOSIS — F329 Major depressive disorder, single episode, unspecified: Secondary | ICD-10-CM | POA: Diagnosis not present

## 2014-06-27 DIAGNOSIS — F329 Major depressive disorder, single episode, unspecified: Secondary | ICD-10-CM | POA: Diagnosis not present

## 2014-06-27 DIAGNOSIS — E669 Obesity, unspecified: Secondary | ICD-10-CM | POA: Diagnosis not present

## 2014-06-27 DIAGNOSIS — I10 Essential (primary) hypertension: Secondary | ICD-10-CM | POA: Diagnosis not present

## 2014-06-28 DIAGNOSIS — E669 Obesity, unspecified: Secondary | ICD-10-CM | POA: Diagnosis not present

## 2014-06-28 DIAGNOSIS — I10 Essential (primary) hypertension: Secondary | ICD-10-CM | POA: Diagnosis not present

## 2014-06-28 DIAGNOSIS — F329 Major depressive disorder, single episode, unspecified: Secondary | ICD-10-CM | POA: Diagnosis not present

## 2014-06-29 DIAGNOSIS — F329 Major depressive disorder, single episode, unspecified: Secondary | ICD-10-CM | POA: Diagnosis not present

## 2014-06-29 DIAGNOSIS — I10 Essential (primary) hypertension: Secondary | ICD-10-CM | POA: Diagnosis not present

## 2014-06-29 DIAGNOSIS — E669 Obesity, unspecified: Secondary | ICD-10-CM | POA: Diagnosis not present

## 2014-06-30 DIAGNOSIS — I10 Essential (primary) hypertension: Secondary | ICD-10-CM | POA: Diagnosis not present

## 2014-06-30 DIAGNOSIS — F329 Major depressive disorder, single episode, unspecified: Secondary | ICD-10-CM | POA: Diagnosis not present

## 2014-06-30 DIAGNOSIS — E669 Obesity, unspecified: Secondary | ICD-10-CM | POA: Diagnosis not present

## 2014-07-01 DIAGNOSIS — E669 Obesity, unspecified: Secondary | ICD-10-CM | POA: Diagnosis not present

## 2014-07-01 DIAGNOSIS — I10 Essential (primary) hypertension: Secondary | ICD-10-CM | POA: Diagnosis not present

## 2014-07-01 DIAGNOSIS — F329 Major depressive disorder, single episode, unspecified: Secondary | ICD-10-CM | POA: Diagnosis not present

## 2014-07-02 DIAGNOSIS — I10 Essential (primary) hypertension: Secondary | ICD-10-CM | POA: Diagnosis not present

## 2014-07-02 DIAGNOSIS — E669 Obesity, unspecified: Secondary | ICD-10-CM | POA: Diagnosis not present

## 2014-07-02 DIAGNOSIS — F329 Major depressive disorder, single episode, unspecified: Secondary | ICD-10-CM | POA: Diagnosis not present

## 2014-07-03 DIAGNOSIS — I10 Essential (primary) hypertension: Secondary | ICD-10-CM | POA: Diagnosis not present

## 2014-07-03 DIAGNOSIS — E669 Obesity, unspecified: Secondary | ICD-10-CM | POA: Diagnosis not present

## 2014-07-03 DIAGNOSIS — F329 Major depressive disorder, single episode, unspecified: Secondary | ICD-10-CM | POA: Diagnosis not present

## 2014-07-04 DIAGNOSIS — I10 Essential (primary) hypertension: Secondary | ICD-10-CM | POA: Diagnosis not present

## 2014-07-04 DIAGNOSIS — E669 Obesity, unspecified: Secondary | ICD-10-CM | POA: Diagnosis not present

## 2014-07-04 DIAGNOSIS — F329 Major depressive disorder, single episode, unspecified: Secondary | ICD-10-CM | POA: Diagnosis not present

## 2014-07-05 DIAGNOSIS — I10 Essential (primary) hypertension: Secondary | ICD-10-CM | POA: Diagnosis not present

## 2014-07-05 DIAGNOSIS — E669 Obesity, unspecified: Secondary | ICD-10-CM | POA: Diagnosis not present

## 2014-07-05 DIAGNOSIS — F329 Major depressive disorder, single episode, unspecified: Secondary | ICD-10-CM | POA: Diagnosis not present

## 2014-07-06 DIAGNOSIS — E669 Obesity, unspecified: Secondary | ICD-10-CM | POA: Diagnosis not present

## 2014-07-06 DIAGNOSIS — I10 Essential (primary) hypertension: Secondary | ICD-10-CM | POA: Diagnosis not present

## 2014-07-06 DIAGNOSIS — F329 Major depressive disorder, single episode, unspecified: Secondary | ICD-10-CM | POA: Diagnosis not present

## 2014-07-07 DIAGNOSIS — E669 Obesity, unspecified: Secondary | ICD-10-CM | POA: Diagnosis not present

## 2014-07-07 DIAGNOSIS — I10 Essential (primary) hypertension: Secondary | ICD-10-CM | POA: Diagnosis not present

## 2014-07-07 DIAGNOSIS — F329 Major depressive disorder, single episode, unspecified: Secondary | ICD-10-CM | POA: Diagnosis not present

## 2014-07-08 DIAGNOSIS — I10 Essential (primary) hypertension: Secondary | ICD-10-CM | POA: Diagnosis not present

## 2014-07-08 DIAGNOSIS — E669 Obesity, unspecified: Secondary | ICD-10-CM | POA: Diagnosis not present

## 2014-07-08 DIAGNOSIS — F329 Major depressive disorder, single episode, unspecified: Secondary | ICD-10-CM | POA: Diagnosis not present

## 2014-07-09 DIAGNOSIS — I10 Essential (primary) hypertension: Secondary | ICD-10-CM | POA: Diagnosis not present

## 2014-07-09 DIAGNOSIS — E669 Obesity, unspecified: Secondary | ICD-10-CM | POA: Diagnosis not present

## 2014-07-09 DIAGNOSIS — F329 Major depressive disorder, single episode, unspecified: Secondary | ICD-10-CM | POA: Diagnosis not present

## 2014-07-10 DIAGNOSIS — I10 Essential (primary) hypertension: Secondary | ICD-10-CM | POA: Diagnosis not present

## 2014-07-10 DIAGNOSIS — F329 Major depressive disorder, single episode, unspecified: Secondary | ICD-10-CM | POA: Diagnosis not present

## 2014-07-10 DIAGNOSIS — E669 Obesity, unspecified: Secondary | ICD-10-CM | POA: Diagnosis not present

## 2014-07-11 DIAGNOSIS — F329 Major depressive disorder, single episode, unspecified: Secondary | ICD-10-CM | POA: Diagnosis not present

## 2014-07-11 DIAGNOSIS — E669 Obesity, unspecified: Secondary | ICD-10-CM | POA: Diagnosis not present

## 2014-07-11 DIAGNOSIS — I10 Essential (primary) hypertension: Secondary | ICD-10-CM | POA: Diagnosis not present

## 2014-07-12 DIAGNOSIS — E669 Obesity, unspecified: Secondary | ICD-10-CM | POA: Diagnosis not present

## 2014-07-12 DIAGNOSIS — F329 Major depressive disorder, single episode, unspecified: Secondary | ICD-10-CM | POA: Diagnosis not present

## 2014-07-12 DIAGNOSIS — I10 Essential (primary) hypertension: Secondary | ICD-10-CM | POA: Diagnosis not present

## 2014-07-13 ENCOUNTER — Other Ambulatory Visit: Payer: Self-pay | Admitting: Family Medicine

## 2014-07-13 ENCOUNTER — Telehealth: Payer: Self-pay | Admitting: *Deleted

## 2014-07-13 DIAGNOSIS — I10 Essential (primary) hypertension: Secondary | ICD-10-CM | POA: Diagnosis not present

## 2014-07-13 DIAGNOSIS — E669 Obesity, unspecified: Secondary | ICD-10-CM | POA: Diagnosis not present

## 2014-07-13 DIAGNOSIS — R069 Unspecified abnormalities of breathing: Secondary | ICD-10-CM | POA: Diagnosis not present

## 2014-07-13 DIAGNOSIS — F329 Major depressive disorder, single episode, unspecified: Secondary | ICD-10-CM | POA: Diagnosis not present

## 2014-07-13 NOTE — Telephone Encounter (Signed)
Would not recommend nebulizer given her heart problems without wheezing which I have never heard on her exam.

## 2014-07-13 NOTE — Telephone Encounter (Signed)
Pt's HH aide called stating that pt is requesting a nebulizer for her home to use and would like to see if you would order one for her? Please advise!  Please call back with any questions either at pts number or [facility] aide number that is listed above

## 2014-07-13 NOTE — Telephone Encounter (Signed)
Called pt and aware that provider does not recommend nebulizer

## 2014-07-14 DIAGNOSIS — I10 Essential (primary) hypertension: Secondary | ICD-10-CM | POA: Diagnosis not present

## 2014-07-14 DIAGNOSIS — F329 Major depressive disorder, single episode, unspecified: Secondary | ICD-10-CM | POA: Diagnosis not present

## 2014-07-14 DIAGNOSIS — E669 Obesity, unspecified: Secondary | ICD-10-CM | POA: Diagnosis not present

## 2014-07-15 DIAGNOSIS — I10 Essential (primary) hypertension: Secondary | ICD-10-CM | POA: Diagnosis not present

## 2014-07-15 DIAGNOSIS — F329 Major depressive disorder, single episode, unspecified: Secondary | ICD-10-CM | POA: Diagnosis not present

## 2014-07-15 DIAGNOSIS — E669 Obesity, unspecified: Secondary | ICD-10-CM | POA: Diagnosis not present

## 2014-07-16 DIAGNOSIS — F329 Major depressive disorder, single episode, unspecified: Secondary | ICD-10-CM | POA: Diagnosis not present

## 2014-07-16 DIAGNOSIS — I10 Essential (primary) hypertension: Secondary | ICD-10-CM | POA: Diagnosis not present

## 2014-07-16 DIAGNOSIS — E669 Obesity, unspecified: Secondary | ICD-10-CM | POA: Diagnosis not present

## 2014-07-16 NOTE — Telephone Encounter (Signed)
Refill appropriate and filled per protocol. 

## 2014-07-17 DIAGNOSIS — E669 Obesity, unspecified: Secondary | ICD-10-CM | POA: Diagnosis not present

## 2014-07-17 DIAGNOSIS — I10 Essential (primary) hypertension: Secondary | ICD-10-CM | POA: Diagnosis not present

## 2014-07-17 DIAGNOSIS — F329 Major depressive disorder, single episode, unspecified: Secondary | ICD-10-CM | POA: Diagnosis not present

## 2014-07-18 ENCOUNTER — Telehealth: Payer: Self-pay | Admitting: Family Medicine

## 2014-07-18 DIAGNOSIS — F329 Major depressive disorder, single episode, unspecified: Secondary | ICD-10-CM | POA: Diagnosis not present

## 2014-07-18 DIAGNOSIS — I10 Essential (primary) hypertension: Secondary | ICD-10-CM | POA: Diagnosis not present

## 2014-07-18 DIAGNOSIS — E669 Obesity, unspecified: Secondary | ICD-10-CM | POA: Diagnosis not present

## 2014-07-18 NOTE — Telephone Encounter (Signed)
Pt c/o severe shortnes of breath.  States EMS was to house 5 days ago for same.  Gasping over the phone.  Told patient she needs to call 911 again and let them carry her to the ED.  She REFUSES.  Wants to see only Dr Dennard Schaumann.  Offered her appt here today but again REFUSED.  Gave next appt available with Dr Dennard Schaumann.  Told her my instructions to her NOW are call EMS and go to ED, TODAY!!!!!.  I called daughter, [name].  Made aware of conversation with mother.  Told her I felt she really needed to be seen today.  Daughter feels mother just needs a nebulizer at home to help her when SOB.  She also said she will see if someone can bring her here today, as offered.

## 2014-07-18 NOTE — Telephone Encounter (Signed)
Agree 

## 2014-07-19 DIAGNOSIS — E669 Obesity, unspecified: Secondary | ICD-10-CM | POA: Diagnosis not present

## 2014-07-19 DIAGNOSIS — I10 Essential (primary) hypertension: Secondary | ICD-10-CM | POA: Diagnosis not present

## 2014-07-19 DIAGNOSIS — F329 Major depressive disorder, single episode, unspecified: Secondary | ICD-10-CM | POA: Diagnosis not present

## 2014-07-20 ENCOUNTER — Ambulatory Visit (INDEPENDENT_AMBULATORY_CARE_PROVIDER_SITE_OTHER): Payer: 59 | Admitting: Family Medicine

## 2014-07-20 ENCOUNTER — Encounter: Payer: Self-pay | Admitting: Family Medicine

## 2014-07-20 VITALS — BP 160/90 | HR 88 | Temp 98.0°F | Resp 24 | Ht 61.5 in | Wt 215.0 lb

## 2014-07-20 DIAGNOSIS — I10 Essential (primary) hypertension: Secondary | ICD-10-CM | POA: Diagnosis not present

## 2014-07-20 DIAGNOSIS — E669 Obesity, unspecified: Secondary | ICD-10-CM | POA: Diagnosis not present

## 2014-07-20 DIAGNOSIS — J449 Chronic obstructive pulmonary disease, unspecified: Secondary | ICD-10-CM | POA: Diagnosis not present

## 2014-07-20 DIAGNOSIS — F329 Major depressive disorder, single episode, unspecified: Secondary | ICD-10-CM | POA: Diagnosis not present

## 2014-07-20 DIAGNOSIS — IMO0001 Reserved for inherently not codable concepts without codable children: Secondary | ICD-10-CM

## 2014-07-20 NOTE — Progress Notes (Signed)
Subjective:    Patient ID: [name], female    DOB: [birth year], 81 y.o.   MRN: [id number]  HPI  Patient reports wheezing at times. She does have a remote history of tobacco abuse. She denies any asthma. Recently she became short of breath and EMS was called to her house. She received a nebulizer treatment and her shortness of breath improved dramatically.  Today in the office I perform spirometry. FEV1 to FVC ratio was 59% suggesting obstructive lung disease. FEV1 was 0.77 L which is 60% of predicted. This would be consistent with stage II COPD. Past Medical History  Diagnosis Date  . Hypertension   . Obesity   . Depression   . Tachy-brady syndrome     a. 03/2006 s/p SJM 5356 Verity ADx XLDR DC PPM (ser#: [id number]).  . CKD (chronic kidney disease), stage III   . Gout   . Chronic systolic CHF (congestive heart failure)     a. 10/2013 Echo: EF 25-30%, diff HK, Gr 1 DD, triv AI, mild to mod MR, dev dil LA, mild RV dysfxn, mildly dil RA, mod TR.  . Cardiomyopathy     a. ? ischemic vs non-ischemic;  b. 09/2013 Echo: EF 25-30%.  Marland Kitchen COPD (chronic obstructive pulmonary disease)    Past Surgical History  Procedure Laterality Date  . Pacemaker insertion     Current Outpatient Prescriptions on File Prior to Visit  Medication Sig Dispense Refill  . acetaminophen (TYLENOL) 650 MG CR tablet Take 650 mg by mouth every 8 (eight) hours as needed for pain.    Marland Kitchen allopurinol (ZYLOPRIM) 100 MG tablet TAKE 2 TABLETS BY MOUTH ONCE DAILY 60 tablet 3  . aspirin 81 MG tablet Take 1 tablet (81 mg total) by mouth daily. 30 tablet 11  . calcium carbonate (TUMS - DOSED IN MG ELEMENTAL CALCIUM) 500 MG chewable tablet Chew 1 tablet by mouth daily.    . carvedilol (COREG) 25 MG tablet TAKE 1 TABLET BY MOUTH TWICE DAILY WITH A MEAL 60 tablet 10  . citalopram (CELEXA) 10 MG tablet TAKE 1 TABLET BY MOUTH DAILY 30 tablet 5  . DIGOX 125 MCG tablet TAKE ONE TABLET BY MOUTH DAILY 30 tablet 3  . famotidine (PEPCID) 20  MG tablet Take 20 mg by mouth every morning.    Marland Kitchen FERROUS SULFATE PO Take 65 mg by mouth daily.    . furosemide (LASIX) 20 MG tablet TAKE 1 TABLET BY MOUTH DAILY AS NEEDED (TAKE ONLY IF 1 POUND WEIGHT INCREASE IN A DAY OR SEVERE SWELLING IN FEET AND LEGS) 30 tablet 1  . furosemide (LASIX) 40 MG tablet Take 1 tablet (40 mg total) by mouth 2 (two) times daily. (Patient taking differently: Take 40 mg by mouth as needed. )    . omeprazole (PRILOSEC) 40 MG capsule TAKE 1 CAPSULE BY MOUTH DAILY 30 capsule 11  . valsartan (DIOVAN) 320 MG tablet TAKE 1 TABLET BY MOUTH DAILY 30 tablet 5   No current facility-administered medications on file prior to visit.   No Known Allergies History   Social History  . Marital Status: Widowed    Spouse Name: N/A  . Number of Children: N/A  . Years of Education: N/A   Occupational History  . Not on file.   Social History Main Topics  . Smoking status: Former Research scientist (life sciences)  . Smokeless tobacco: Not on file     Comment: quit 2011  . Alcohol Use: No  . Drug Use: No  .  Sexual Activity: Not on file   Other Topics Concern  . Not on file   Social History Narrative     Review of Systems  All other systems reviewed and are negative.      Objective:   Physical Exam  Constitutional: She appears well-developed and well-nourished.  Cardiovascular: Normal rate, regular rhythm and normal heart sounds.   Pulmonary/Chest: Effort normal. She has wheezes.  Abdominal: Soft. Bowel sounds are normal.  Musculoskeletal: She exhibits no edema.  Vitals reviewed.         Assessment & Plan:  COPD bronchitis  Patient appears to have COPD stage 2. I will give the patient prescription for a nebulizer machine. She can use 2.5 mg albuterol nebs every 6 hours as needed. Given her cardiac history I cautioned the patient to be extremely cautious using this medication and only use it for when she is truly wheezing and short of breath

## 2014-07-21 DIAGNOSIS — E669 Obesity, unspecified: Secondary | ICD-10-CM | POA: Diagnosis not present

## 2014-07-21 DIAGNOSIS — I10 Essential (primary) hypertension: Secondary | ICD-10-CM | POA: Diagnosis not present

## 2014-07-21 DIAGNOSIS — F329 Major depressive disorder, single episode, unspecified: Secondary | ICD-10-CM | POA: Diagnosis not present

## 2014-07-22 DIAGNOSIS — E669 Obesity, unspecified: Secondary | ICD-10-CM | POA: Diagnosis not present

## 2014-07-22 DIAGNOSIS — F329 Major depressive disorder, single episode, unspecified: Secondary | ICD-10-CM | POA: Diagnosis not present

## 2014-07-22 DIAGNOSIS — I10 Essential (primary) hypertension: Secondary | ICD-10-CM | POA: Diagnosis not present

## 2014-07-23 ENCOUNTER — Emergency Department (HOSPITAL_COMMUNITY): Payer: Medicare Other

## 2014-07-23 ENCOUNTER — Inpatient Hospital Stay (HOSPITAL_COMMUNITY)
Admission: EM | Admit: 2014-07-23 | Discharge: 2014-07-27 | DRG: 291 | Disposition: A | Discharge status: Home / Self Care | Payer: Medicare Other | Attending: Internal Medicine | Admitting: Internal Medicine

## 2014-07-23 ENCOUNTER — Encounter (HOSPITAL_COMMUNITY): Payer: Self-pay | Admitting: Emergency Medicine

## 2014-07-23 DIAGNOSIS — M2550 Pain in unspecified joint: Secondary | ICD-10-CM | POA: Diagnosis not present

## 2014-07-23 DIAGNOSIS — K219 Gastro-esophageal reflux disease without esophagitis: Secondary | ICD-10-CM | POA: Diagnosis present

## 2014-07-23 DIAGNOSIS — Z7982 Long term (current) use of aspirin: Secondary | ICD-10-CM | POA: Diagnosis not present

## 2014-07-23 DIAGNOSIS — I509 Heart failure, unspecified: Secondary | ICD-10-CM

## 2014-07-23 DIAGNOSIS — K210 Gastro-esophageal reflux disease with esophagitis: Secondary | ICD-10-CM | POA: Diagnosis not present

## 2014-07-23 DIAGNOSIS — R0902 Hypoxemia: Secondary | ICD-10-CM | POA: Diagnosis not present

## 2014-07-23 DIAGNOSIS — Z87891 Personal history of nicotine dependence: Secondary | ICD-10-CM

## 2014-07-23 DIAGNOSIS — R0602 Shortness of breath: Secondary | ICD-10-CM

## 2014-07-23 DIAGNOSIS — N183 Chronic kidney disease, stage 3 (moderate): Secondary | ICD-10-CM

## 2014-07-23 DIAGNOSIS — M109 Gout, unspecified: Secondary | ICD-10-CM | POA: Diagnosis present

## 2014-07-23 DIAGNOSIS — I5023 Acute on chronic systolic (congestive) heart failure: Secondary | ICD-10-CM | POA: Diagnosis not present

## 2014-07-23 DIAGNOSIS — I10 Essential (primary) hypertension: Secondary | ICD-10-CM | POA: Diagnosis not present

## 2014-07-23 DIAGNOSIS — N184 Chronic kidney disease, stage 4 (severe): Secondary | ICD-10-CM | POA: Diagnosis present

## 2014-07-23 DIAGNOSIS — E669 Obesity, unspecified: Secondary | ICD-10-CM | POA: Diagnosis present

## 2014-07-23 DIAGNOSIS — I5022 Chronic systolic (congestive) heart failure: Secondary | ICD-10-CM | POA: Diagnosis not present

## 2014-07-23 DIAGNOSIS — J42 Unspecified chronic bronchitis: Secondary | ICD-10-CM | POA: Diagnosis not present

## 2014-07-23 DIAGNOSIS — I129 Hypertensive chronic kidney disease with stage 1 through stage 4 chronic kidney disease, or unspecified chronic kidney disease: Secondary | ICD-10-CM | POA: Diagnosis not present

## 2014-07-23 DIAGNOSIS — N189 Chronic kidney disease, unspecified: Secondary | ICD-10-CM

## 2014-07-23 DIAGNOSIS — F32A Depression, unspecified: Secondary | ICD-10-CM | POA: Diagnosis present

## 2014-07-23 DIAGNOSIS — F329 Major depressive disorder, single episode, unspecified: Secondary | ICD-10-CM | POA: Diagnosis not present

## 2014-07-23 DIAGNOSIS — J9601 Acute respiratory failure with hypoxia: Secondary | ICD-10-CM | POA: Diagnosis not present

## 2014-07-23 DIAGNOSIS — H409 Unspecified glaucoma: Secondary | ICD-10-CM | POA: Diagnosis present

## 2014-07-23 DIAGNOSIS — M1990 Unspecified osteoarthritis, unspecified site: Secondary | ICD-10-CM | POA: Diagnosis present

## 2014-07-23 DIAGNOSIS — I340 Nonrheumatic mitral (valve) insufficiency: Secondary | ICD-10-CM | POA: Diagnosis not present

## 2014-07-23 DIAGNOSIS — J449 Chronic obstructive pulmonary disease, unspecified: Secondary | ICD-10-CM | POA: Diagnosis not present

## 2014-07-23 DIAGNOSIS — I495 Sick sinus syndrome: Secondary | ICD-10-CM

## 2014-07-23 DIAGNOSIS — I517 Cardiomegaly: Secondary | ICD-10-CM | POA: Diagnosis not present

## 2014-07-23 DIAGNOSIS — Z6839 Body mass index (BMI) 39.0-39.9, adult: Secondary | ICD-10-CM

## 2014-07-23 DIAGNOSIS — Z950 Presence of cardiac pacemaker: Secondary | ICD-10-CM | POA: Diagnosis not present

## 2014-07-23 DIAGNOSIS — I255 Ischemic cardiomyopathy: Secondary | ICD-10-CM | POA: Diagnosis not present

## 2014-07-23 DIAGNOSIS — R32 Unspecified urinary incontinence: Secondary | ICD-10-CM | POA: Diagnosis present

## 2014-07-23 DIAGNOSIS — J811 Chronic pulmonary edema: Secondary | ICD-10-CM | POA: Diagnosis not present

## 2014-07-23 HISTORY — DX: Gastro-esophageal reflux disease without esophagitis: K21.9

## 2014-07-23 HISTORY — DX: Calculus of kidney: N20.0

## 2014-07-23 HISTORY — DX: Unspecified glaucoma: H40.9

## 2014-07-23 HISTORY — DX: Unspecified osteoarthritis, unspecified site: M19.90

## 2014-07-23 LAB — CBC WITH DIFFERENTIAL/PLATELET
BASOS ABS: 0 10*3/uL (ref 0.0–0.1)
Basophils Relative: 1 % (ref 0–1)
EOS ABS: 0.3 10*3/uL (ref 0.0–0.7)
EOS PCT: 7 % — AB (ref 0–5)
HEMATOCRIT: 32.9 % — AB (ref 36.0–46.0)
Hemoglobin: 10.4 g/dL — ABNORMAL LOW (ref 12.0–15.0)
Lymphocytes Relative: 36 % (ref 12–46)
Lymphs Abs: 1.8 10*3/uL (ref 0.7–4.0)
MCH: 30 pg (ref 26.0–34.0)
MCHC: 31.6 g/dL (ref 30.0–36.0)
MCV: 94.8 fL (ref 78.0–100.0)
Monocytes Absolute: 0.6 10*3/uL (ref 0.1–1.0)
Monocytes Relative: 12 % (ref 3–12)
Neutro Abs: 2.2 10*3/uL (ref 1.7–7.7)
Neutrophils Relative %: 44 % (ref 43–77)
PLATELETS: 183 10*3/uL (ref 150–400)
RBC: 3.47 MIL/uL — ABNORMAL LOW (ref 3.87–5.11)
RDW: 13.8 % (ref 11.5–15.5)
WBC: 4.9 10*3/uL (ref 4.0–10.5)

## 2014-07-23 LAB — BASIC METABOLIC PANEL
Anion gap: 4 — ABNORMAL LOW (ref 5–15)
BUN: 18 mg/dL (ref 6–23)
CO2: 29 mmol/L (ref 19–32)
CREATININE: 1.4 mg/dL — AB (ref 0.50–1.10)
Calcium: 8.1 mg/dL — ABNORMAL LOW (ref 8.4–10.5)
Chloride: 104 mmol/L (ref 96–112)
GFR calc non Af Amer: 32 mL/min — ABNORMAL LOW (ref >90)
GFR, EST AFRICAN AMERICAN: 37 mL/min — AB (ref >90)
Glucose, Bld: 157 mg/dL — ABNORMAL HIGH (ref 70–99)
Potassium: 4.5 mmol/L (ref 3.5–5.1)
Sodium: 137 mmol/L (ref 135–145)

## 2014-07-23 LAB — PROTIME-INR
INR: 1.14 (ref 0.00–1.49)
Prothrombin Time: 14.7 seconds (ref 11.6–15.2)

## 2014-07-23 LAB — TROPONIN I
Troponin I: 0.08 ng/mL — ABNORMAL HIGH (ref <0.031)
Troponin I: 0.09 ng/mL — ABNORMAL HIGH (ref <0.031)
Troponin I: 0.09 ng/mL — ABNORMAL HIGH (ref <0.031)

## 2014-07-23 LAB — I-STAT TROPONIN, ED: Troponin i, poc: 0.08 ng/mL (ref 0.00–0.08)

## 2014-07-23 LAB — BRAIN NATRIURETIC PEPTIDE: B Natriuretic Peptide: 1275.8 pg/mL — ABNORMAL HIGH (ref 0.0–100.0)

## 2014-07-23 MED ORDER — CETYLPYRIDINIUM CHLORIDE 0.05 % MT LIQD
7.0000 mL | Freq: Two times a day (BID) | OROMUCOSAL | Status: DC
  Administered 2014-07-24 – 2014-07-26 (×4): 7 mL via OROMUCOSAL

## 2014-07-23 MED ORDER — CARVEDILOL 25 MG PO TABS
25.0000 mg | ORAL_TABLET | Freq: Two times a day (BID) | ORAL | Status: DC
  Administered 2014-07-23 – 2014-07-27 (×9): 25 mg via ORAL
  Filled 2014-07-23 (×9): qty 1

## 2014-07-23 MED ORDER — ASPIRIN 325 MG PO TABS
325.0000 mg | ORAL_TABLET | Freq: Once | ORAL | Status: AC
  Administered 2014-07-23: 325 mg via ORAL
  Filled 2014-07-23: qty 1

## 2014-07-23 MED ORDER — ALLOPURINOL 100 MG PO TABS
200.0000 mg | ORAL_TABLET | Freq: Every day | ORAL | Status: DC
  Administered 2014-07-23 – 2014-07-27 (×5): 200 mg via ORAL
  Filled 2014-07-23 (×6): qty 2

## 2014-07-23 MED ORDER — SODIUM CHLORIDE 0.9 % IJ SOLN
3.0000 mL | Freq: Two times a day (BID) | INTRAMUSCULAR | Status: DC
  Administered 2014-07-23 – 2014-07-26 (×6): 3 mL via INTRAVENOUS

## 2014-07-23 MED ORDER — CITALOPRAM HYDROBROMIDE 10 MG PO TABS
10.0000 mg | ORAL_TABLET | Freq: Every day | ORAL | Status: DC
  Administered 2014-07-23 – 2014-07-27 (×5): 10 mg via ORAL
  Filled 2014-07-23 (×5): qty 1

## 2014-07-23 MED ORDER — ACETAMINOPHEN 325 MG PO TABS
650.0000 mg | ORAL_TABLET | Freq: Three times a day (TID) | ORAL | Status: DC | PRN
  Administered 2014-07-23 – 2014-07-26 (×2): 650 mg via ORAL
  Filled 2014-07-23 (×2): qty 2

## 2014-07-23 MED ORDER — NITROGLYCERIN 0.4 MG SL SUBL: 0.4000 mg | SUBLINGUAL_TABLET | SUBLINGUAL | Status: DC | PRN

## 2014-07-23 MED ORDER — IRBESARTAN 150 MG PO TABS
300.0000 mg | ORAL_TABLET | Freq: Every day | ORAL | Status: DC
  Administered 2014-07-23 – 2014-07-27 (×5): 300 mg via ORAL
  Filled 2014-07-23 (×5): qty 2

## 2014-07-23 MED ORDER — FERROUS SULFATE 300 (60 FE) MG/5ML PO SYRP
300.0000 mg | ORAL_SOLUTION | Freq: Every day | ORAL | Status: DC
  Administered 2014-07-23 – 2014-07-27 (×5): 300 mg via ORAL
  Filled 2014-07-23 (×5): qty 5

## 2014-07-23 MED ORDER — PANTOPRAZOLE SODIUM 40 MG PO TBEC
40.0000 mg | DELAYED_RELEASE_TABLET | Freq: Every day | ORAL | Status: DC
  Administered 2014-07-23 – 2014-07-27 (×5): 40 mg via ORAL
  Filled 2014-07-23 (×5): qty 1

## 2014-07-23 MED ORDER — CALCIUM CARBONATE ANTACID 500 MG PO CHEW
1.0000 | CHEWABLE_TABLET | Freq: Every day | ORAL | Status: DC
  Administered 2014-07-23 – 2014-07-27 (×5): 200 mg via ORAL
  Filled 2014-07-23 (×5): qty 1

## 2014-07-23 MED ORDER — HEPARIN SODIUM (PORCINE) 5000 UNIT/ML IJ SOLN
5000.0000 [IU] | Freq: Three times a day (TID) | INTRAMUSCULAR | Status: DC
  Administered 2014-07-23 – 2014-07-27 (×12): 5000 [IU] via SUBCUTANEOUS
  Filled 2014-07-23 (×12): qty 1

## 2014-07-23 MED ORDER — ISOSORBIDE MONONITRATE ER 30 MG PO TB24
30.0000 mg | ORAL_TABLET | Freq: Every day | ORAL | Status: DC
  Administered 2014-07-23 – 2014-07-27 (×5): 30 mg via ORAL
  Filled 2014-07-23 (×5): qty 1

## 2014-07-23 MED ORDER — FERROUS SULFATE 75 (15 FE) MG/ML PO SOLN
65.0000 mg | Freq: Every day | ORAL | Status: DC
Start: 2014-07-23 — End: 2014-07-23
  Filled 2014-07-23 (×2): qty 0.87

## 2014-07-23 MED ORDER — FUROSEMIDE 10 MG/ML IJ SOLN
40.0000 mg | Freq: Every day | INTRAMUSCULAR | Status: DC
  Administered 2014-07-23: 40 mg via INTRAVENOUS
  Filled 2014-07-23: qty 4

## 2014-07-23 MED ORDER — ALBUTEROL SULFATE (2.5 MG/3ML) 0.083% IN NEBU: 2.5000 mg | INHALATION_SOLUTION | RESPIRATORY_TRACT | Status: DC | PRN

## 2014-07-23 MED ORDER — DIGOXIN 125 MCG PO TABS
125.0000 ug | ORAL_TABLET | Freq: Every day | ORAL | Status: DC
  Administered 2014-07-23 – 2014-07-27 (×5): 125 ug via ORAL
  Filled 2014-07-23 (×5): qty 1

## 2014-07-23 MED ORDER — FUROSEMIDE 10 MG/ML IJ SOLN
40.0000 mg | Freq: Two times a day (BID) | INTRAMUSCULAR | Status: DC
  Administered 2014-07-23 – 2014-07-26 (×6): 40 mg via INTRAVENOUS
  Filled 2014-07-23 (×6): qty 4

## 2014-07-23 MED ORDER — ASPIRIN EC 81 MG PO TBEC
81.0000 mg | DELAYED_RELEASE_TABLET | Freq: Every day | ORAL | Status: DC
  Administered 2014-07-23 – 2014-07-27 (×5): 81 mg via ORAL
  Filled 2014-07-23 (×5): qty 1

## 2014-07-23 NOTE — ED Notes (Signed)
Pt states that she started feeling short of breath last week. Pt states that she "got hot" this morning.

## 2014-07-23 NOTE — ED Notes (Signed)
Pt placed on 2L [place]

## 2014-07-23 NOTE — Evaluation (Signed)
Physical Therapy Evaluation Patient Details Name: [name] MRN: [id number] DOB: 03-Mar-1923 Today's Date: 07/23/2014   History of Present Illness  [name] is a 81 y.o. female with past medical history of tachybradycardia syndrome, hypertension, CHF, EF 25%, cardiomyopathy admitted with shortness of breath.  Clinical Impression  Pt admitted with above diagnosis. Pt currently with functional limitations due to the deficits listed below (see PT Problem List). Pt will benefit from skilled PT to increase their independence and safety with mobility to allow discharge to the venue listed below.  Pt could use a RW for home use and has 24 hour S at home.  Recommend HHPT as well.  Pt ambulated with o2 sats in 90's on 2 L/min.     Follow Up Recommendations Home health PT;Supervision/Assistance - 24 hour    Equipment Recommendations  Rolling walker with 5" wheels    Recommendations for Other Services       Precautions / Restrictions Precautions Precautions: Fall Restrictions Weight Bearing Restrictions: No      Mobility  Bed Mobility Overal bed mobility: Needs Assistance Bed Mobility: Supine to Sit     Supine to sit: Min guard;HOB elevated     General bed mobility comments: HOB slightly elevated with rails  Transfers Overall transfer level: Needs assistance Equipment used: Rolling walker (2 wheeled) Transfers: Sit to/from Stand Sit to Stand: Min guard         General transfer comment: good use of hands  Ambulation/Gait Ambulation/Gait assistance: Min guard Ambulation Distance (Feet): 40 Feet Assistive device: Rolling walker (2 wheeled) Gait Pattern/deviations: Decreased step length - right;Decreased step length - left     General Gait Details: Amb on 2 L/min o2 with sats averaging about 97%.  Decreased step lengths B'[place].  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      Hand Dominance   Dominant Hand: Right    Extremity/Trunk Assessment   Upper Extremity Assessment: Overall WFL for tasks assessed           Lower Extremity Assessment: Generalized weakness;Overall WFL for tasks assessed         Communication   Communication: No difficulties  Cognition Arousal/Alertness: Awake/alert Behavior During Therapy: WFL for tasks assessed/performed Overall Cognitive Status: Within Functional Limits for tasks assessed                      General Comments      Exercises  Assessment/Plan    PT Assessment Patient needs continued PT services  PT Diagnosis Difficulty walking;Generalized weakness   PT Problem List Decreased strength;Decreased activity tolerance;Decreased balance;Decreased mobility;Decreased knowledge of use of DME  PT Treatment Interventions Gait training;Functional mobility training;Therapeutic activities;Therapeutic exercise;DME instruction;Balance training   PT Goals (Current goals can be found in the Care Plan section) Acute Rehab PT Goals Patient Stated Goal: To go home PT Goal Formulation: With patient Time For Goal  Achievement: 08/06/14 Potential to Achieve Goals: Good    Frequency Min 3X/week   Barriers to discharge        Co-evaluation               End of Session Equipment Utilized During Treatment: Gait belt Activity Tolerance: Patient tolerated treatment well Patient left: in chair;with chair alarm set;with call bell/phone within reach Nurse Communication: Mobility status;Other (comment) (02 sats)         Time: [id number] PT Time Calculation (min) (ACUTE ONLY): 34 min   Charges:   PT Evaluation $Initial PT Evaluation Tier I: 1 Procedure PT Treatments $Gait Training: 8-22 mins   PT G Codes:       [name], [place] Pager [phone] 07/23/2014   [name] 07/23/2014, 12:59 PM

## 2014-07-23 NOTE — Care Management Note (Addendum)
    Page 1 of 2   07/26/2014     2:51:03 PM CARE MANAGEMENT NOTE 07/26/2014  Patient:  [name], [name]   Account Number:  [id number]  Date Initiated:  07/23/2014  Documentation initiated by:  GRAVES-BIGELOW,Tigerlily Christine  Subjective/Objective Assessment:   Pt admitted for SOB. Pt is from home with granddaughter. Pt has a Medicaid Aide that is with her while the granddaughter is not home.     Action/Plan:   CM did place call to granddaughter to see if Pt has 24 hr supervision and received no answer. CM will continue to monitor.   Anticipated DC Date:  07/25/2014   Anticipated DC Plan:  [place]  CM consult      [facility] Choice  HOME HEALTH   Choice offered to / List presented to:  C-4 Adult Children   DME arranged  [facility]      DME agency  [place] arranged  HH-1 RN  [place].   Status of service:  Completed, signed off Medicare Important Message given?  YES (If response is "NO", the following Medicare IM given date fields will be blank) Date Medicare IM given:  07/26/2014 Medicare IM given by:  GRAVES-BIGELOW,Tiant [place] Date Additional Medicare IM given:   Additional Medicare IM given by:    Discharge Disposition:  [place]  Per UR Regulation:  Reviewed for med. necessity/level of care/duration of stay  If discussed at [place] of Stay Meetings, dates discussed:    Comments:  07-25-14 1213 Jacqlyn Krauss, RN,BSN [phone] CM did call [facility] to make them aware that pt will need 02 for home. Liaison will deliver e tank to room before d/c. No further needs from CM at this time.   07-24-14 [address] Jacqlyn Krauss, RN,BSN [phone] CM did check with [facility] and pt will not be able to get RW via [facility]. Pt received Rollator in 2013 and is not eligible for another. No DME to be delivered at this time.   07-24-14 [address] Jacqlyn Krauss,  RN,BSN [phone] CM did speak with granddaughter in ref to [facility] services. Pt agreeable with HH services via [place]. Referral made with [facility] for services listed above. MD orders and f33f to be placed in EPIC once pt is medically stable for d/c.  DME placed in EPIC and [facility] to check to see if pt eligible to get RW for insurance to cover. Pt has Medicaid Aide at home for 2 hours per day via [facility]. Per family pt has 24 hr coverage. No further needs from CM at this time.

## 2014-07-23 NOTE — Progress Notes (Signed)
[facility] PROGRESS NOTE  [name] [id number] DOB: 01/18/1923 DOA: 07/23/2014 PCP: Odette Fraction, MD  Brief Summary  81 year old female with history of hypertension, GERD, COPD, chronic kidney disease stage III, systolic congestive heart failure with ejection fraction of 25-30%, depression, tachycardia-bradycardia syndrome status post pacemaker placement who presented with shortness of breath. Her symptoms started approximately 2 hours prior to coming to the emergency department. She denied chest pain but had had some cough. When EMS arrived she was 82% on room air which improved to greater than 90% when placed on nonrebreather. Nitroglycerin improved her shortness of breath. She stated she had been compliant with her Lasix. She denied fevers or other symptoms of infection. She also denied lower extremity swelling. In the emergency department her troponin was 0.08 and her EKG was stable from prior. BNP was 1275 and chest x-ray demonstrated pulmonary vascular congestion. She was admitted for acute on chronic systolic heart failure exacerbation and was started on IV Lasix.  Assessment/Plan  Acute hypoxic respiratory failure secondary to acute on chronic systolic heart failure:  BNP 1275, vascular congestions on CXR.  2-D echo 10/28/10 showed EF 25-30% with grade 1 diastolic dysfunction.  On Lasix 40 mg daily at home.  Feeling better.   -  Tele:  NSR -  Troponins:  Repeatedly 0.08, stable and likely elevated from CKD -  Daily ECG -  coreg and aspirin - increase to V lasix 40 mg BID - Continue aspirin, Coreg - 2d echo -  Daily weights, strict I/O -  Add imdur  COPD: stable. No signs of acute exacerbation -Albuterol nebulizer when necessary  CKD-III: Baseline creatinine 1.4-1.7. Her creatinine is 1.4 on admission. -[facility]  Tachybradycardia syndrome: Heart rate is controlled. -On pacemaker -Continue digox  Count: Stable. -Continue  allopurinol  Hypertension: -Switch valsartan to irbesartan in hospital -Also on Lasix and Coreg  Diet:  Low sodium Access:  PIV IVF:  off Proph:  heparin  Code Status: full Family Communication: spoke with patient alone Disposition Plan:  Pending further diuresis   Consultants:  none  Procedures:  ECHO  Antibiotics:  none   HPI/Subjective:  SOB somewhat better.  Voiding frequently.  Still has some tightness in her chest  Objective: Filed Vitals:   07/23/14 0936 07/23/14 1051 07/23/14 1252 07/23/14 1320  BP:      Pulse: 68     Temp:    98.1 F (36.7 C)  TempSrc:    Oral  Resp:      Height:  5\' 2"  (1.575 m)    Weight:  96.525 kg (212 lb 12.8 oz)    SpO2:   97% 100%    Intake/Output Summary (Last 24 hours) at 07/23/14 1426 Last data filed at 07/23/14 1300  Gross per 24 hour  Intake    240 ml  Output    200 ml  Net     40 ml   Filed Weights   07/23/14 1051  Weight: 96.525 kg (212 lb 12.8 oz)    Exam:   General:  Obese F, No acute distress  HEENT:  NCAT, MMM  Cardiovascular:  RRR, distant S1/S2, + gallop, 2+ pulses, warm extremities  Respiratory:  Diminished left base and bilateral basilar rales, no wheezes or rhonchi, no increased WOB  Abdomen:   NABS, soft, NT/ND  MSK:   Normal tone and bulk, 1+ bilateral LEE  Neuro:  Grossly intact  Data Reviewed: Basic Metabolic Panel:  Recent Labs Lab 07/23/14 [phone]  NA 137  K 4.5  CL 104  CO2 29  GLUCOSE 157*  BUN 18  CREATININE 1.40*  CALCIUM 8.1*   Liver Function Tests: No results for input(s): AST, ALT, ALKPHOS, BILITOT, PROT, ALBUMIN in the last 168 hours. No results for input(s): LIPASE, AMYLASE in the last 168 hours. No results for input(s): AMMONIA in the last 168 hours. CBC:  Recent Labs Lab 07/23/14 0412  WBC 4.9  NEUTROABS 2.2  HGB 10.4*  HCT 32.9*  MCV 94.8  PLT 183   Cardiac Enzymes:  Recent Labs Lab 07/23/14 0903  TROPONINI 0.08*   BNP (last 3  results)  Recent Labs  07/23/14 0422  BNP 1275.8*    ProBNP (last 3 results)  Recent Labs  10/17/13 1508 10/23/13 1438  PROBNP 6565.0* 4105.00*    CBG: No results for input(s): GLUCAP in the last 168 hours.  No results found for this or any previous visit (from the past 240 hour(s)).   Studies: Dg Chest Portable 1 View  07/23/2014   CLINICAL DATA:  Shortness of breath  EXAM: PORTABLE CHEST - 1 VIEW  COMPARISON:  10/17/2013  FINDINGS: Unchanged appearance of dual-chamber pacer leads from the right, with 2 right ventricular leads.  Stable cardiomegaly, moderate aortic tortuosity, and vascular enlargement of the hila. Pulmonary venous congestion without edema. No pneumonia, definitive effusion (blunting of the left costophrenic sulcus is chronic and likely scarring), or pneumothorax. There is chronic widening of the upper right mediastinum, favor ectatic vasculature.  IMPRESSION: 1. Cardiomegaly and pulmonary venous congestion. 2. COPD.   Electronically Signed   By: Monte Fantasia M.D.   On: 07/23/2014 04:41    Scheduled Meds: . allopurinol  200 mg Oral Daily  . aspirin EC  81 mg Oral Daily  . calcium carbonate  1 tablet Oral Daily  . carvedilol  25 mg Oral BID WC  . citalopram  10 mg Oral Daily  . digoxin  125 mcg Oral Daily  . ferrous sulfate  300 mg Oral Q breakfast  . furosemide  40 mg Intravenous Daily  . heparin  5,000 Units Subcutaneous 3 times per day  . irbesartan  300 mg Oral Daily  . pantoprazole  40 mg Oral Daily  . sodium chloride  3 mL Intravenous Q12H   Continuous Infusions:   Principal Problem:   SOB (shortness of breath) Active Problems:   Hypertension   Obesity   Depression   Tachy-brady syndrome   Pacemaker-St.Jude   Gout   Arthritis   GERD (gastroesophageal reflux disease)   Chronic systolic CHF (congestive heart failure)   CKD (chronic kidney disease), stage III   COPD (chronic obstructive pulmonary disease)   Time spent: 30  min    Keano Guggenheim, [place] [facility] Pager [phone]. If 7PM-7AM, please contact night-coverage at [email or web address], password [facility] 07/23/2014, 2:26 PM  LOS: 0 days

## 2014-07-23 NOTE — ED Notes (Signed)
Pt uses walker when she is at home.

## 2014-07-23 NOTE — ED Notes (Signed)
Patient here from home via EMS with complaint of shortness of breath. Patient called 911, was reported to have labored breathing on phone with dispatch. Upon arrival patient found laying on floor with low O2 sats, breathing heavily, lethargic, and diaphoretic with audible crackles. Patient was repositioned and 100% oxygen via NRB was applied patient began to wake up, breathing slowed, and oxygen level recovered. EMS administered 2 SL NTG 0.4 tabs and breath sounds improved.

## 2014-07-23 NOTE — H&P (Addendum)
[facility] History and Physical  [name] [id number] DOB: [birth year] DOA: 07/23/2014  Referring physician: ED physician PCP: Odette Fraction, MD  Specialists:   Chief Complaint: SOB  HPI: [name] is a 81 y.o. female with PMH of hypertension, GERD, COPD, gout, chronic kidney disease-stage III, congestive heart failure with EF 25-30%, depression, tachycardia-bradycardia syndrome, pacemaker placement, who presents with shortness distress.  Patient SOB started 2 hours prior to arrival. She reports her entire body was got hot and she woke up with shortness of breath. She has mild cough without sputum production. She denies any chest pain. Per EMS, patient's oxygen saturation was 82% on room air, which improve to above 90% when placed on nonrebreather. She was given 2 nitroglycerin tablets which improved her difficult breathing. She states she has been compliant with her Lasix medication. When I evaluated pt in ED, her SOB has resolved. Patient denies fever, chills, fatigue, headaches, cough, chest pain, SOB, abdominal pain, diarrhea, constipation, dysuria, urgency, frequency, hematuria, skin rashes, joint pain or leg swelling. No unilateral weakness, numbness or tingling sensations. No vision change or hearing loss.  In ED, patient was found to have troponin 0.08, normal temperature, stable renal function. EKG showed old left bundle blockage and QTC 490. BNP=1275. Chest x-ray showed mild pulmonary congestion. Patient is admitted to inpatient for further evaluation and treatment.  Review of Systems: As presented in the history of presenting illness, rest negative.  Where does patient live? At home with granddaughter   Can patient participate in ADLs? None  Allergy: No Known Allergies  Past Medical History  Diagnosis Date  . Hypertension   . Obesity   . Depression   . Tachy-brady syndrome     a. 03/2006 s/p SJM [id number] Verity ADx XLDR DC PPM (ser#: [id number]).  . CKD  (chronic kidney disease), stage III   . Gout   . Chronic systolic CHF (congestive heart failure)     a. 10/2013 Echo: EF 25-30%, diff HK, Gr 1 DD, triv AI, mild to mod MR, dev dil LA, mild RV dysfxn, mildly dil RA, mod TR.  . Cardiomyopathy     a. ? ischemic vs non-ischemic;  b. 09/2013 Echo: EF 25-30%.  Marland Kitchen COPD (chronic obstructive pulmonary disease)   . Glaucoma of both eyes     Past Surgical History  Procedure Laterality Date  . Pacemaker insertion      Social History:  reports that she has quit smoking. She does not have any smokeless tobacco history on file. She reports that she does not drink alcohol or use illicit drugs.  Family History:  Family History  Problem Relation Age of Onset  . Heart attack Neg Hx   . Stroke Neg Hx   . Hypertension Mother      Prior to Admission medications   Medication Sig Start Date End Date Taking? Authorizing Provider  acetaminophen (TYLENOL) 650 MG CR tablet Take 650 mg by mouth every 8 (eight) hours as needed for pain.   Yes Historical Provider, MD  allopurinol (ZYLOPRIM) 100 MG tablet TAKE 2 TABLETS BY MOUTH ONCE DAILY 03/26/14  Yes [name], MD  aspirin 81 MG tablet Take 1 tablet (81 mg total) by mouth daily. 04/27/13  Yes Deboraha Sprang, MD  calcium carbonate (TUMS - DOSED IN MG ELEMENTAL CALCIUM) 500 MG chewable tablet Chew 1 tablet by mouth daily.   Yes Historical Provider, MD  carvedilol (COREG) 25 MG tablet TAKE 1 TABLET BY MOUTH TWICE DAILY WITH  A MEAL 03/28/14  Yes Deboraha Sprang, MD  citalopram (CELEXA) 10 MG tablet TAKE 1 TABLET BY MOUTH DAILY 06/19/14  Yes [name], MD  DIGOX 125 MCG tablet TAKE ONE TABLET BY MOUTH DAILY 03/26/14  Yes [name], MD  FERROUS SULFATE PO Take 65 mg by mouth daily.   Yes Historical Provider, MD  furosemide (LASIX) 20 MG tablet TAKE 1 TABLET BY MOUTH DAILY AS NEEDED (TAKE ONLY IF 1 POUND WEIGHT INCREASE IN A DAY OR SEVERE SWELLING IN FEET AND LEGS) 07/16/14  Yes [name], MD   furosemide (LASIX) 40 MG tablet Take 1 tablet (40 mg total) by mouth 2 (two) times daily. Patient taking differently: Take 40 mg by mouth daily.  04/16/14 04/16/15 Yes Scott T Kathlen Mody, PA-C  omeprazole (PRILOSEC) 40 MG capsule TAKE 1 CAPSULE BY MOUTH DAILY 07/16/14  Yes [name], MD  valsartan (DIOVAN) 320 MG tablet TAKE 1 TABLET BY MOUTH DAILY 05/14/14  Yes [name], MD    Physical Exam: Filed Vitals:   07/23/14 0445 07/23/14 0500 07/23/14 0515 07/23/14 0530  BP: 155/85 152/74 154/78 155/78  Pulse: 84 78 77 76  Temp:      TempSrc:      Resp: 19 28 22 21   SpO2: 95% 94% 95% 95%   General: Not in acute distress HEENT:       Eyes: PERRL, EOMI, no scleral icterus       ENT: No discharge from the ears and nose, no pharynx injection, no tonsillar enlargement.        Neck: Positive JVD, no bruit, no mass felt. Cardiac: S1/S2, RRR, No murmurs, No gallops or rubs Pulm: mild rhonchi, No rale or wheezing. Abd: Soft, nondistended, nontender, no rebound pain, no organomegaly, BS present Ext: No edema bilaterally. 2+DP/PT pulse bilaterally Musculoskeletal: No joint deformities, erythema, or stiffness, ROM full Skin: No rashes.  Neuro: Alert and oriented X3, cranial nerves II-XII grossly intact, muscle strength 5/5 in all extremeties, sensation to light touch intact.  Psych: Patient is not psychotic, no suicidal or hemocidal ideation.  Labs on Admission:  Basic Metabolic Panel:  Recent Labs Lab 07/23/14 0412  NA 137  K 4.5  CL 104  CO2 29  GLUCOSE 157*  BUN 18  CREATININE 1.40*  CALCIUM 8.1*   Liver Function Tests: No results for input(s): AST, ALT, ALKPHOS, BILITOT, PROT, ALBUMIN in the last 168 hours. No results for input(s): LIPASE, AMYLASE in the last 168 hours. No results for input(s): AMMONIA in the last 168 hours. CBC:  Recent Labs Lab 07/23/14 0412  WBC 4.9  NEUTROABS 2.2  HGB 10.4*  HCT 32.9*  MCV 94.8  PLT 183   Cardiac Enzymes: No results for  input(s): CKTOTAL, CKMB, CKMBINDEX, TROPONINI in the last 168 hours.  BNP (last 3 results) No results for input(s): BNP in the last 8760 hours.  ProBNP (last 3 results)  Recent Labs  10/17/13 1508 10/23/13 1438  PROBNP 6565.0* 4105.00*    CBG: No results for input(s): GLUCAP in the last 168 hours.  Radiological Exams on Admission: Dg Chest Portable 1 View  07/23/2014   CLINICAL DATA:  Shortness of breath  EXAM: PORTABLE CHEST - 1 VIEW  COMPARISON:  10/17/2013  FINDINGS: Unchanged appearance of dual-chamber pacer leads from the right, with 2 right ventricular leads.  Stable cardiomegaly, moderate aortic tortuosity, and vascular enlargement of the hila. Pulmonary venous congestion without edema. No pneumonia, definitive effusion (blunting of the  left costophrenic sulcus is chronic and likely scarring), or pneumothorax. There is chronic widening of the upper right mediastinum, favor ectatic vasculature.  IMPRESSION: 1. Cardiomegaly and pulmonary venous congestion. 2. COPD.   Electronically Signed   By: Monte Fantasia M.D.   On: 07/23/2014 04:41    EKG: Independently reviewed. Old left bundle blockage and QTc 490.  Assessment/Plan Principal Problem:   SOB (shortness of breath) Active Problems:   Hypertension   Obesity   Depression   Tachy-brady syndrome   Pacemaker-St.Jude   Gout   Arthritis   GERD (gastroesophageal reflux disease)   Chronic systolic CHF (congestive heart failure)   CKD (chronic kidney disease), stage III   COPD (chronic obstructive pulmonary disease)  SOB: it is likely due to mild CHF exacerbation given elevated BNP 1275 and positive JVD. 2-D echo 10/28/10 showed EF 25-30% with grade 1 diastolic dysfunction. Currently patient is taking Lasix 40 mg daily at home. Patient does not seem to have COPD exacerbation since she does not have cough or chest pain, and her lung auscultation only has mild rhonchi, without wheezing. Ed physician is concerned about her marginal  trop 0.08 for ACS. Currently patient does not have shortness of breath or chest pain.   - will admit to tele bed - cycle CE q6 x3 and repeat her EKG in the am  - Nitroglycerin prn, Coreg and aspirin - IV lasix 40 mg daily - Continue aspirin, Coreg  -repeat 2d echo  COPD: stable. No signs of acute exacerbation -Albuterol nebulizer when necessary  CKD-III: Baseline creatinine 1.4-1.7. Her creatinine is 1.4 on admission. -Follow-up [facility] function at [facility]  Tachybradycardia syndrome: Heart rate is controlled. -On pacemaker -Continue digox  Count: Stable. -Continue allopurinol  Hypertension: -Switch valsartan to irbesartan in hospital -Also on Lasix and Coreg   DVT ppx: SQ Heparin      Code Status: Full code Family Communication: None at bed side.   Disposition Plan: Admit to inpatient   Date of Service 07/23/2014    Ivor Costa [facility] Pager [phone]  If 7PM-7AM, please contact night-coverage [email or web address] Password [facility] 07/23/2014, 5:59 AM

## 2014-07-23 NOTE — ED Notes (Signed)
Jugular vein distension noted bilaterally.

## 2014-07-23 NOTE — ED Provider Notes (Signed)
CSN: [id number]     Arrival date & time 07/23/14  0403 History   First MD Initiated Contact with Patient 07/23/14 0421     Chief Complaint  Patient presents with  . Shortness of Breath     (Consider location/radiation/quality/duration/timing/severity/associated sxs/prior Treatment) HPI  [name] is a 81 y.o. female with past medical history of tachybradycardia syndrome, hypertension, CHF, EF 25%, cardiomyopathy presenting today with shortness of breath. Patient states 2 hours prior to arrival her entire body got hot and she woke up with shortness of breath. She denies any chest pain. Per EMS patient was 82% on room air and placed on nonrebreather. She was given 2 nitroglycerin tablets which improved her difficulty breathing. She states she has been compliant with her Lasix medication. She denies any fevers or recent coughing. Patient currently states her breathing has improved, patient has no further complaints.  10 Systems reviewed and are negative for acute change except as noted in the HPI.     Past Medical History  Diagnosis Date  . Hypertension   . Obesity   . Depression   . Tachy-brady syndrome     a. 03/2006 s/p SJM [id number] Verity ADx XLDR DC PPM (ser#: [id number]).  . CKD (chronic kidney disease), stage III   . Gout   . Chronic systolic CHF (congestive heart failure)     a. 10/2013 Echo: EF 25-30%, diff HK, Gr 1 DD, triv AI, mild to mod MR, dev dil LA, mild RV dysfxn, mildly dil RA, mod TR.  . Cardiomyopathy     a. ? ischemic vs non-ischemic;  b. 09/2013 Echo: EF 25-30%.  Marland Kitchen COPD (chronic obstructive pulmonary disease)   . Glaucoma of both eyes    Past Surgical History  Procedure Laterality Date  . Pacemaker insertion     Family History  Problem Relation Age of Onset  . Heart attack Neg Hx   . Stroke Neg Hx   . Hypertension Mother    History  Substance Use Topics  . Smoking status: Former Research scientist (life sciences)  . Smokeless tobacco: Not on file     Comment: quit 2011  . Alcohol  Use: No   OB History    No data available     Review of Systems    Allergies  Review of patient's allergies indicates no known allergies.  Home Medications   Prior to Admission medications   Medication Sig Start Date End Date Taking? Authorizing Provider  acetaminophen (TYLENOL) 650 MG CR tablet Take 650 mg by mouth every 8 (eight) hours as needed for pain.   Yes Historical Provider, MD  allopurinol (ZYLOPRIM) 100 MG tablet TAKE 2 TABLETS BY MOUTH ONCE DAILY 03/26/14  Yes [name], MD  aspirin 81 MG tablet Take 1 tablet (81 mg total) by mouth daily. 04/27/13  Yes Deboraha Sprang, MD  calcium carbonate (TUMS - DOSED IN MG ELEMENTAL CALCIUM) 500 MG chewable tablet Chew 1 tablet by mouth daily.   Yes Historical Provider, MD  carvedilol (COREG) 25 MG tablet TAKE 1 TABLET BY MOUTH TWICE DAILY WITH A MEAL 03/28/14  Yes Deboraha Sprang, MD  citalopram (CELEXA) 10 MG tablet TAKE 1 TABLET BY MOUTH DAILY 06/19/14  Yes [name], MD  DIGOX 125 MCG tablet TAKE ONE TABLET BY MOUTH DAILY 03/26/14  Yes [name], MD  FERROUS SULFATE PO Take 65 mg by mouth daily.   Yes Historical Provider, MD  furosemide (LASIX) 20 MG tablet TAKE 1 TABLET BY  MOUTH DAILY AS NEEDED (TAKE ONLY IF 1 POUND WEIGHT INCREASE IN A DAY OR SEVERE SWELLING IN FEET AND LEGS) 07/16/14  Yes [name], MD  furosemide (LASIX) 40 MG tablet Take 1 tablet (40 mg total) by mouth 2 (two) times daily. Patient taking differently: Take 40 mg by mouth daily.  04/16/14 04/16/15 Yes Scott T Kathlen Mody, PA-C  omeprazole (PRILOSEC) 40 MG capsule TAKE 1 CAPSULE BY MOUTH DAILY 07/16/14  Yes [name], MD  valsartan (DIOVAN) 320 MG tablet TAKE 1 TABLET BY MOUTH DAILY 05/14/14  Yes [name], MD   BP 155/85 mmHg  Pulse 84  Temp(Src) 97.4 F (36.3 C) (Oral)  Resp 19  SpO2 95% Physical Exam  Constitutional: She is oriented to person, place, and time. She appears well-developed and well-nourished. No distress.  HENT:   Head: Normocephalic and atraumatic.  Nose: Nose normal.  Mouth/Throat: Oropharynx is clear and moist. No oropharyngeal exudate.  Eyes: Conjunctivae and EOM are normal. Pupils are equal, round, and reactive to light. No scleral icterus.  Neck: Normal range of motion. Neck supple. JVD present. No tracheal deviation present. No thyromegaly present.  Cardiovascular: Normal rate, regular rhythm and normal heart sounds.  Exam reveals no gallop and no friction rub.   No murmur heard. Pulmonary/Chest: Effort normal and breath sounds normal. No respiratory distress. She has no wheezes. She exhibits no tenderness.  Pacemaker in chest wall  Abdominal: Soft. Bowel sounds are normal. She exhibits no distension and no mass. There is no tenderness. There is no rebound and no guarding.  Musculoskeletal: Normal range of motion. She exhibits no edema or tenderness.  Lymphadenopathy:    She has no cervical adenopathy.  Neurological: She is alert and oriented to person, place, and time. No cranial nerve deficit. She exhibits normal muscle tone.  Skin: Skin is warm and dry. No rash noted. No erythema. No pallor.  Nursing note and vitals reviewed.   ED Course  Procedures (including critical care time) Labs Review Labs Reviewed  CBC WITH DIFFERENTIAL/PLATELET - Abnormal; Notable for the following:    RBC 3.47 (*)    Hemoglobin 10.4 (*)    HCT 32.9 (*)    Eosinophils Relative 7 (*)    All other components within normal limits  BASIC METABOLIC PANEL - Abnormal; Notable for the following:    Glucose, Bld 157 (*)    Creatinine, Ser 1.40 (*)    Calcium 8.1 (*)    GFR calc non Af Amer 32 (*)    GFR calc Af Amer 37 (*)    Anion gap 4 (*)    All other components within normal limits  BRAIN NATRIURETIC PEPTIDE - Abnormal; Notable for the following:    B Natriuretic Peptide 1275.8 (*)    All other components within normal limits  I-STAT TROPOININ, ED    Imaging Review Dg Chest Portable 1  View  07/23/2014   CLINICAL DATA:  Shortness of breath  EXAM: PORTABLE CHEST - 1 VIEW  COMPARISON:  10/17/2013  FINDINGS: Unchanged appearance of dual-chamber pacer leads from the right, with 2 right ventricular leads.  Stable cardiomegaly, moderate aortic tortuosity, and vascular enlargement of the hila. Pulmonary venous congestion without edema. No pneumonia, definitive effusion (blunting of the left costophrenic sulcus is chronic and likely scarring), or pneumothorax. There is chronic widening of the upper right mediastinum, favor ectatic vasculature.  IMPRESSION: 1. Cardiomegaly and pulmonary venous congestion. 2. COPD.   Electronically Signed   By: Angelica Chessman  Watts M.D.   On: 07/23/2014 04:41     EKG Interpretation   Date/Time:  Monday July 23 2014 04:57:42 EST Ventricular Rate:  78 PR Interval:  193 QRS Duration: 120 QT Interval:  416 QTC Calculation: 474 R Axis:   -60 Text Interpretation:  Sinus rhythm Left bundle branch block No significant  change since last tracing Confirmed by Glynn Octave [phone]) on  07/23/2014 5:36:55 AM      MDM   Final diagnoses:  SOB (shortness of breath)    Patient presents emergency department for sudden onset shortness of breath. Differential includes fluid overload due to her CHF, versus NSTEMI.  Troponin is 0.08, BNP is 1275, chest x-ray does show pulmonary venous congestion. On exam patient is not remarkably overloaded with trace amounts of edema and clear lungs. She does have JVD bilaterally. Due to patient's age and risk factors she will be admitted for cardiac workup. Aspirin given in the emergency department.    Everlene Balls, MD 07/23/14 [phone]

## 2014-07-23 NOTE — ED Notes (Signed)
Pt changed, upon laying down pt started to quickly de-sat, pt was immediately sat up and had a non-re breather applied. Pts oxygen started to increase and returned to 100%. Non re breather removed.

## 2014-07-23 NOTE — ED Notes (Signed)
Pt lives with granddaughter

## 2014-07-23 NOTE — Progress Notes (Signed)
UR Completed Aubreyanna Dorrough Graves-Bigelow, RN,BSN [phone]  

## 2014-07-24 DIAGNOSIS — I509 Heart failure, unspecified: Secondary | ICD-10-CM

## 2014-07-24 DIAGNOSIS — J42 Unspecified chronic bronchitis: Secondary | ICD-10-CM

## 2014-07-24 DIAGNOSIS — I5023 Acute on chronic systolic (congestive) heart failure: Secondary | ICD-10-CM

## 2014-07-24 LAB — BASIC METABOLIC PANEL
Anion gap: 7 (ref 5–15)
BUN: 22 mg/dL (ref 6–23)
CALCIUM: 8.3 mg/dL — AB (ref 8.4–10.5)
CHLORIDE: 100 mmol/L (ref 96–112)
CO2: 31 mmol/L (ref 19–32)
CREATININE: 1.65 mg/dL — AB (ref 0.50–1.10)
GFR calc non Af Amer: 26 mL/min — ABNORMAL LOW (ref >90)
GFR, EST AFRICAN AMERICAN: 31 mL/min — AB (ref >90)
Glucose, Bld: 91 mg/dL (ref 70–99)
Potassium: 4.2 mmol/L (ref 3.5–5.1)
Sodium: 138 mmol/L (ref 135–145)

## 2014-07-24 LAB — CBC
HEMATOCRIT: 28.5 % — AB (ref 36.0–46.0)
Hemoglobin: 9.2 g/dL — ABNORMAL LOW (ref 12.0–15.0)
MCH: 29.7 pg (ref 26.0–34.0)
MCHC: 32.3 g/dL (ref 30.0–36.0)
MCV: 91.9 fL (ref 78.0–100.0)
PLATELETS: 149 10*3/uL — AB (ref 150–400)
RBC: 3.1 MIL/uL — AB (ref 3.87–5.11)
RDW: 13.5 % (ref 11.5–15.5)
WBC: 5.2 10*3/uL (ref 4.0–10.5)

## 2014-07-24 LAB — GLUCOSE, CAPILLARY
Glucose-Capillary: 108 mg/dL — ABNORMAL HIGH (ref 70–99)
Glucose-Capillary: 67 mg/dL — ABNORMAL LOW (ref 70–99)

## 2014-07-24 MED ORDER — SPIRONOLACTONE 25 MG PO TABS
12.5000 mg | ORAL_TABLET | Freq: Every day | ORAL | Status: DC
  Administered 2014-07-25 – 2014-07-26 (×2): 12.5 mg via ORAL
  Filled 2014-07-24 (×2): qty 1

## 2014-07-24 NOTE — Progress Notes (Signed)
  Echocardiogram 2D Echocardiogram has been performed.  [name] 07/24/2014, 11:11 AM

## 2014-07-24 NOTE — Progress Notes (Addendum)
[facility] PROGRESS NOTE  [name] [id number] DOB: 1922-09-16 DOA: 07/23/2014 PCP: Odette Fraction, MD  Brief Summary  81 year old female with history of hypertension, GERD, COPD, chronic kidney disease stage III, systolic congestive heart failure with ejection fraction of 25-30%, depression, tachycardia-bradycardia syndrome status post pacemaker placement who presented with shortness of breath. Her symptoms started approximately 2 hours prior to coming to the emergency department. She denied chest pain but had had some cough. When EMS arrived she was 82% on room air which improved to greater than 90% when placed on nonrebreather. Nitroglycerin improved her shortness of breath. She stated she had been compliant with her Lasix. She denied fevers or other symptoms of infection. She also denied lower extremity swelling. In the emergency department her troponin was 0.08 and her EKG was stable from prior. BNP was 1275 and chest x-ray demonstrated pulmonary vascular congestion. She was admitted for acute on chronic systolic heart failure exacerbation and was started on IV Lasix.  Assessment/Plan  Acute hypoxic respiratory failure secondary to acute on chronic systolic heart failure due to ischemic cardiomyopathy:  BNP 1275, vascular congestions on CXR.  2-D echo 10/28/10 showed EF 25-30% with grade 1 diastolic dysfunction, but EF significantly improve on ECHO during this admission.  On Lasix 40 mg daily at home.  Feeling better.   -  Tele:  NSR, ECG:  Stable Q-waves in inferior leads  - 2d echo:  Ejection fraction 45-50% with basal to mid inferior hypokinesis, grade 1 diastolic dysfunction -  Incontinent some of the time, so I/O not totally accurate -  Weight 96 > 95kg  -  Troponins:  Repeatedly 0.08, stable and likely elevated from CKD -  Continue aspirin, coreg and ARB -  Continue imdur - Continue lasix 40 mg IV BID -  Add spironolactone with careful monitoring of potassium -   Hesitant to add hydralazine due to tachy-brady syndrome  COPD: stable. No signs of acute exacerbation -Albuterol nebulizer when necessary  CKD-III: Baseline creatinine 1.4-1.7. Creatinine trending up slightly but still in her normal range -Follow-[facility]  Tachybradycardia syndrome: Heart rate is controlled. -On pacemaker -Continue digox  Mitral valve regurgitation, moderate with tethered valve leaflet -  Per cardiology  Gout: Stable. -Continue allopurinol  Hypertension:  Blood pressures gradually trending down -  Continue ARB, BB, lasix -  Adding spironolactone  Normocytic anemia, progressive since admission despite diuresis, no obvious bleeding -  Iron studies, b12, folate, TSH, occult stool -  SPEP/UPEP   Globulin gap -  SPEP/UPEP, HIV, HCV  Diet:  Low sodium Access:  PIV IVF:  off Proph:  heparin  Code Status: full Family Communication: spoke with patient alone Disposition Plan:  Pending further diuresis   Consultants:  none  Procedures:  ECHO  Antibiotics:  none   HPI/Subjective:  Feeling less SOB today.  Got down to 80s with ambulation today.  Voiding frequently with incontinence when lasix is first given.  Improved chest tightness  Objective: Filed Vitals:   07/23/14 1320 07/23/14 1600 07/23/14 2059 07/24/14 0506  BP: 135/62 112/58 137/64 154/85  Pulse:   87 68  Temp: 98.1 F (36.7 C)  98.3 F (36.8 C) 98 F (36.7 C)  TempSrc: Oral  Oral Oral  Resp: 18 18 20 18   Height:      Weight:    95.119 kg (209 lb 11.2 oz)  SpO2: 100% 100% 100% 100%    Intake/Output Summary (Last 24 hours) at 07/24/14 1401 Last data  filed at 07/24/14 0237  Gross per 24 hour  Intake    500 ml  Output   1000 ml  Net   -500 ml   Filed Weights   07/23/14 1051 07/24/14 0506  Weight: 96.525 kg (212 lb 12.8 oz) 95.119 kg (209 lb 11.2 oz)    Exam:   General:  Obese F, No acute distress  HEENT:  NCAT, MMM  Cardiovascular:  RRR, distant  S1/S2, + gallop, 2+ pulses, warm extremities  Respiratory:  Diminished left base and bilateral basilar rales to the mid-back, no wheezes or rhonchi, no increased WOB  Abdomen:   NABS, soft, NT/ND  MSK:   Normal tone and bulk, 1+ bilateral LEE  Neuro:  Grossly intact  Data Reviewed: Basic Metabolic Panel:  Recent Labs Lab 07/23/14 0412 07/24/14 0401  NA 137 138  K 4.5 4.2  CL 104 100  CO2 29 31  GLUCOSE 157* 91  BUN 18 22  CREATININE 1.40* 1.65*  CALCIUM 8.1* 8.3*   Liver Function Tests: No results for input(s): AST, ALT, ALKPHOS, BILITOT, PROT, ALBUMIN in the last 168 hours. No results for input(s): LIPASE, AMYLASE in the last 168 hours. No results for input(s): AMMONIA in the last 168 hours. CBC:  Recent Labs Lab 07/23/14 0412 07/24/14 0401  WBC 4.9 5.2  NEUTROABS 2.2  --   HGB 10.4* 9.2*  HCT 32.9* 28.5*  MCV 94.8 91.9  PLT 183 149*   Cardiac Enzymes:  Recent Labs Lab 07/23/14 0903 07/23/14 1310 07/23/14 1935  TROPONINI 0.08* 0.09* 0.09*   BNP (last 3 results)  Recent Labs  07/23/14 0422  BNP 1275.8*    ProBNP (last 3 results)  Recent Labs  10/17/13 1508 10/23/13 1438  PROBNP 6565.0* 4105.00*    CBG:  Recent Labs Lab 07/24/14 0732 07/24/14 0821  GLUCAP 67* 108*    No results found for this or any previous visit (from the past 240 hour(s)).   Studies: Dg Chest Portable 1 View  07/23/2014   CLINICAL DATA:  Shortness of breath  EXAM: PORTABLE CHEST - 1 VIEW  COMPARISON:  10/17/2013  FINDINGS: Unchanged appearance of dual-chamber pacer leads from the right, with 2 right ventricular leads.  Stable cardiomegaly, moderate aortic tortuosity, and vascular enlargement of the hila. Pulmonary venous congestion without edema. No pneumonia, definitive effusion (blunting of the left costophrenic sulcus is chronic and likely scarring), or pneumothorax. There is chronic widening of the upper right mediastinum, favor ectatic vasculature.  IMPRESSION:  1. Cardiomegaly and pulmonary venous congestion. 2. COPD.   Electronically Signed   By: Monte Fantasia M.D.   On: 07/23/2014 04:41    Scheduled Meds: . allopurinol  200 mg Oral Daily  . antiseptic oral rinse  7 mL Mouth Rinse BID  . aspirin EC  81 mg Oral Daily  . calcium carbonate  1 tablet Oral Daily  . carvedilol  25 mg Oral BID WC  . citalopram  10 mg Oral Daily  . digoxin  125 mcg Oral Daily  . ferrous sulfate  300 mg Oral Q breakfast  . furosemide  40 mg Intravenous BID  . heparin  5,000 Units Subcutaneous 3 times per day  . irbesartan  300 mg Oral Daily  . isosorbide mononitrate  30 mg Oral Daily  . pantoprazole  40 mg Oral Daily  . sodium chloride  3 mL Intravenous Q12H   Continuous Infusions:   Principal Problem:   SOB (shortness of breath) Active Problems:   Hypertension  Obesity   Depression   Tachy-brady syndrome   Pacemaker-St.Jude   Gout   Arthritis   GERD (gastroesophageal reflux disease)   Chronic systolic CHF (congestive heart failure)   CKD (chronic kidney disease), stage III   COPD (chronic obstructive pulmonary disease)   Time spent: 30 min    Maurissa Ambrose, [facility] Pager [phone]. If 7PM-7AM, please contact night-coverage at [email or web address], password [facility] 07/24/2014, 2:01 PM  LOS: 1 day

## 2014-07-24 NOTE — Evaluation (Signed)
Occupational Therapy Evaluation Patient Details Name: [name] MRN: [id number] DOB: April 21, 1923 Today's Date: 07/24/2014    History of Present Illness [name] is a 81 y.o. female with past medical history of tachybradycardia syndrome, hypertension, CHF, EF 25%, cardiomyopathy admitted with shortness of breath.   Clinical Impression   Patient mod I PTA. Patient currently functioning at an overall supervision level, requiring cues for safety and energy conservation techniques. Patient will benefit from acute OT to increase overall independence in the areas of ADLs, functional mobility, and overall safety in order to safely discharge home with 24/7 supervision.     Follow Up Recommendations  No OT follow up;Supervision/Assistance - 24 hour    Equipment Recommendations  None recommended by OT    Recommendations for Other Services  None at this time     Precautions / Restrictions Precautions Precautions: Fall Restrictions Weight Bearing Restrictions: No      Mobility Bed Mobility Overal bed mobility: Needs Assistance Bed Mobility: Supine to Sit     Supine to sit: Min guard;HOB elevated     General bed mobility comments: HOB elevated  Transfers Overall transfer level: Needs assistance Equipment used: Rolling walker (2 wheeled) Transfers: Sit to/from [facility] Sit to Stand: Supervision Stand pivot transfers: Min assist       General transfer comment: Cues needed for safety     Balance Overall balance assessment: Needs assistance Sitting-balance support: Feet supported Sitting balance-Leahy Scale: Good     Standing balance support: During functional activity Standing balance-Leahy Scale: Fair Standing balance comment: Pt able to perform hygiene after toileting.     ADL Overall ADL's : Needs assistance/impaired Eating/Feeding: Independent   Grooming: Supervision/safety;Standing   Upper Body Bathing: Set up;Sitting   Lower Body  Bathing: Supervison/ safety;Sit to/from stand;Cueing for safety   Upper Body Dressing : Set up;Sitting   Lower Body Dressing: Supervision/safety;Sit to/from stand;Cueing for safety   Toilet Transfer: Supervision/safety;RW;BSC;Ambulation   Toileting- Water quality scientist and Hygiene: Supervision/safety;Sit to/from stand;Cueing for safety       Functional mobility during ADLs: Supervision/safety;Rolling walker General ADL Comments: Patient able to reach BLEs for LB ADLs. Patient overall supervision for safety at this time. Patient requires min verbal cues for energy conservation techniques, including pursed lip breathing and rest breaks in order to maintain 02 sats greater than 90%. Patient abmulated <> BR and transferred on/off BSC. Sats =99-100% during EOB activity while on 1.5 liters of 02. On room air, Sats =84% after ambulation activity and increased > 90% with 1.5 liters of 02, pursed lip breathing, and seated rest break.     Pertinent Vitals/Pain Pain Assessment: No/denies pain (Pt with complaints of "I'm just hot")     Hand Dominance Right   Extremity/Trunk Assessment Upper Extremity Assessment Upper Extremity Assessment: Overall WFL for tasks assessed       Cervical / Trunk Assessment Cervical / Trunk Assessment: Normal   Communication Communication Communication: No difficulties   Cognition Arousal/Alertness: Awake/alert Behavior During Therapy: WFL for tasks assessed/performed Overall Cognitive Status: Within Functional Limits for tasks assessed              Home Living Family/patient expects to be discharged to:: Private residence Living Arrangements: Other relatives (granddaughter) Available Help at Discharge: Family;Available 24 hours/day Type of Home: Apartment Home Access: Level entry     Home Layout: One level     Bathroom Shower/Tub: Teacher, early years/pre: Standard     Home Equipment: Latina Craver -  standard;Bedside  commode;Shower seat    Prior Functioning/Environment Level of Independence: Independent with assistive device(s)        Comments: S for outside ambulation only.     OT Diagnosis: Generalized weakness   OT Problem List: Decreased strength;Decreased activity tolerance;Impaired balance (sitting and/or standing);Decreased safety awareness;Decreased knowledge of use of DME or AE   OT Treatment/Interventions: Self-care/ADL training;Energy conservation;DME and/or AE instruction;Therapeutic activities;Patient/family education;Balance training    OT Goals(Current goals can be found in the care plan section) Acute Rehab OT Goals Patient Stated Goal: To go home OT Goal Formulation: With patient Time For Goal Achievement: 07/31/14 Potential to Achieve Goals: Good ADL Goals Pt Will Perform Grooming: with modified independence;standing Pt Will Perform Lower Body Bathing: sit to/from stand;with modified independence Pt Will Perform Lower Body Dressing: with modified independence;sit to/from stand Pt Will Transfer to Toilet: with modified independence;bedside commode;ambulating Pt Will Perform Toileting - Clothing Manipulation and hygiene: with modified independence;sit to/from stand Pt Will Perform Tub/Shower Transfer: Tub transfer;rolling walker;shower seat;ambulating  OT Frequency: Min 2X/week   Barriers to D/C:    None known at this time          End of Session Equipment Utilized During Treatment: Rolling walker  Activity Tolerance: Patient tolerated treatment well Patient left: in chair;with call bell/phone within reach;with chair alarm set   Time: [id number] OT Time Calculation (min): 20 min Charges:  OT General Charges $OT Visit: 1 Procedure OT Evaluation $Initial OT Evaluation Tier I: 1 Procedure  Cyndia Degraff , MS, OTR/L, CLT Pager: [phone]  07/24/2014, 1:38 PM

## 2014-07-24 NOTE — Progress Notes (Signed)
Physical Therapy Treatment Patient Details Name: [name] MRN: [id number] DOB: 11-17-1922 Today's Date: 07/24/2014    History of Present Illness [name] is a 81 y.o. female with past medical history of tachybradycardia syndrome, hypertension, CHF, EF 25%, cardiomyopathy admitted with shortness of breath.    PT Comments    Pt progressing with gait. Amb without o2 87-93% with drop at end to 85% once sitting.  O2 re-applied and was 97% when PT left.  Pt required education for safety with transfers today to decrease fall risk and educated on proper breathing techniques.  Follow Up Recommendations  Home health PT;Supervision/Assistance - 24 hour     Equipment Recommendations  Rolling walker with 5" wheels    Recommendations for Other Services       Precautions / Restrictions Precautions Precautions: Fall Restrictions Weight Bearing Restrictions: No    Mobility  Bed Mobility Overal bed mobility: Needs Assistance Bed Mobility: Supine to Sit     Supine to sit: Min guard;HOB elevated     General bed mobility comments: HOB elevated  Transfers Overall transfer level: Needs assistance Equipment used: Rolling walker (2 wheeled) Transfers: Sit to/from [facility] Sit to Stand: Min guard Stand pivot transfers: Min assist       General transfer comment: Pt needing to use the toilet and so SPT was very rushed and demonstrated decreased safety.  Ambulation/Gait Ambulation/Gait assistance: Min guard Ambulation Distance (Feet): 55 Feet Assistive device: Rolling walker (2 wheeled) Gait Pattern/deviations: Decreased step length - right;Decreased step length - left Gait velocity: slow   General Gait Details: Amb without o2 and sats 87-93% with drop to 85% when pt sitting after gait.  Cues for breathing with gait.  Pt ambulates slowly and likes to stop and see what is going on around her.   Stairs            Wheelchair Mobility    Modified  Rankin (Stroke Patients Only)       Balance Overall balance assessment: Needs assistance Sitting-balance support: Feet supported Sitting balance-Leahy Scale: Good     Standing balance support: During functional activity Standing balance-Leahy Scale: Fair Standing balance comment: Pt able to perform hygiene after toileting.                    Cognition Arousal/Alertness: Awake/alert Behavior During Therapy: WFL for tasks assessed/performed Overall Cognitive Status: Within Functional Limits for tasks assessed                      Exercises      General Comments        Pertinent Vitals/Pain Pain Assessment: No/denies pain    Home Living                      Prior Function            PT Goals (current goals can now be found in the care plan section) Acute Rehab PT Goals Patient Stated Goal: To go home PT Goal Formulation: With patient Time For Goal Achievement: 08/06/14 Potential to Achieve Goals: Good Progress towards PT goals: Progressing toward goals    Frequency  Min 3X/week    PT Plan Current plan remains appropriate    Co-evaluation             End of Session Equipment Utilized During Treatment: Gait belt Activity Tolerance: Patient tolerated treatment well Patient left: in chair;with chair alarm set;with call bell/phone  within reach     Time: 0958-1023 PT Time Calculation (min) (ACUTE ONLY): 25 min  Charges:  $Gait Training: 8-22 mins $Therapeutic Activity: 8-22 mins                    G Codes:     [name], [place] Pager [phone] 07/24/2014  [name] 07/24/2014, 10:34 AM

## 2014-07-24 NOTE — Progress Notes (Signed)
SATURATION QUALIFICATIONS: (This note is used to comply with regulatory documentation for home oxygen)  Patient Saturations on Room Air at Rest = 90%  Patient Saturations on Room Air while Ambulating = 87-93% with drop to 85% once sitting after gait  [name], [place] Pager [phone] 07/24/2014

## 2014-07-25 DIAGNOSIS — I5023 Acute on chronic systolic (congestive) heart failure: Principal | ICD-10-CM

## 2014-07-25 DIAGNOSIS — M1990 Unspecified osteoarthritis, unspecified site: Secondary | ICD-10-CM

## 2014-07-25 DIAGNOSIS — M10012 Idiopathic gout, left shoulder: Secondary | ICD-10-CM

## 2014-07-25 LAB — CBC
HCT: 31.2 % — ABNORMAL LOW (ref 36.0–46.0)
HEMOGLOBIN: 10 g/dL — AB (ref 12.0–15.0)
MCH: 29.9 pg (ref 26.0–34.0)
MCHC: 32.1 g/dL (ref 30.0–36.0)
MCV: 93.1 fL (ref 78.0–100.0)
Platelets: 178 10*3/uL (ref 150–400)
RBC: 3.35 MIL/uL — ABNORMAL LOW (ref 3.87–5.11)
RDW: 13.5 % (ref 11.5–15.5)
WBC: 5.4 10*3/uL (ref 4.0–10.5)

## 2014-07-25 LAB — BASIC METABOLIC PANEL
ANION GAP: 6 (ref 5–15)
BUN: 23 mg/dL (ref 6–23)
CO2: 33 mmol/L — AB (ref 19–32)
Calcium: 8.3 mg/dL — ABNORMAL LOW (ref 8.4–10.5)
Chloride: 98 mmol/L (ref 96–112)
Creatinine, Ser: 1.61 mg/dL — ABNORMAL HIGH (ref 0.50–1.10)
GFR, EST AFRICAN AMERICAN: 32 mL/min — AB (ref >90)
GFR, EST NON AFRICAN AMERICAN: 27 mL/min — AB (ref >90)
GLUCOSE: 89 mg/dL (ref 70–99)
POTASSIUM: 4.2 mmol/L (ref 3.5–5.1)
Sodium: 137 mmol/L (ref 135–145)

## 2014-07-25 LAB — IRON AND TIBC
Iron: 32 ug/dL — ABNORMAL LOW (ref 42–145)
SATURATION RATIOS: 17 % — AB (ref 20–55)
TIBC: 183 ug/dL — ABNORMAL LOW (ref 250–470)
UIBC: 151 ug/dL (ref 125–400)

## 2014-07-25 LAB — FERRITIN: Ferritin: 252 ng/mL (ref 10–291)

## 2014-07-25 LAB — TSH: TSH: 1.024 u[IU]/mL (ref 0.350–4.500)

## 2014-07-25 LAB — VITAMIN B12: Vitamin B-12: 267 pg/mL (ref 211–911)

## 2014-07-25 LAB — GLUCOSE, CAPILLARY: Glucose-Capillary: 70 mg/dL (ref 70–99)

## 2014-07-25 LAB — HEPATITIS C ANTIBODY: HCV Ab: NEGATIVE

## 2014-07-25 NOTE — Progress Notes (Signed)
[facility] PROGRESS NOTE  [name] [id number] DOB: September 02, 1922 DOA: 07/23/2014 PCP: Odette Fraction, MD  Brief Summary  82 year old female with history of hypertension, GERD, COPD, chronic kidney disease stage III, systolic congestive heart failure with ejection fraction of 25-30%, depression, tachycardia-bradycardia syndrome status post pacemaker placement who presented with shortness of breath. Her symptoms started approximately 2 hours prior to coming to the emergency department. She denied chest pain but had had some cough. When EMS arrived she was 82% on room air which improved to greater than 90% when placed on nonrebreather. Nitroglycerin improved her shortness of breath. She stated she had been compliant with her Lasix. She denied fevers or other symptoms of infection. She also denied lower extremity swelling. In the emergency department her troponin was 0.08 and her EKG was stable from prior. BNP was 1275 and chest x-ray demonstrated pulmonary vascular congestion. She was admitted for acute on chronic systolic heart failure exacerbation and was started on IV Lasix.  Assessment/Plan  Acute hypoxic respiratory failure secondary to acute on chronic systolic heart failure due to ischemic cardiomyopathy:  BNP 1275, vascular congestions on CXR.  2-D echo 10/28/10 showed EF 25-30% with grade 1 diastolic dysfunction, but EF significantly improve on ECHO during this admission.  On Lasix 40 mg daily at home.  Feeling better.   -  Tele:  NSR, ECG:  Stable Q-waves in inferior leads  - 2d echo:  Ejection fraction 45-50% with basal to mid inferior hypokinesis, grade 1 diastolic dysfunction -  Incontinent some of the time, so I/O not totally accurate -  Troponins:  Repeatedly 0.08, stable and likely elevated from CKD -  Continue aspirin, coreg and ARB -  Continue imdur - Continue lasix 40 mg IV BID and Aldactone, continue current diuresis.  COPD: stable. No signs of acute  exacerbation -Albuterol nebulizer when necessary  CKD-III: Baseline creatinine 1.4-1.7. Creatinine trending up slightly but still in her normal range -[facility] at [facility]  Tachybradycardia syndrome: Heart rate is controlled. -On pacemaker -Continue digoxin  Mitral valve regurgitation, moderate with tethered valve leaflet -  Per cardiology  Gout: Stable. -Continue allopurinol  Hypertension:  Blood pressures gradually trending down -  Continue ARB, BB, lasix -  Adding spironolactone  Normocytic anemia, progressive since admission despite diuresis, no obvious bleeding -  Iron studies, b12, folate, TSH, occult stool -  SPEP/UPEP   Globulin gap -  SPEP, HCV is negative.   Diet:  Low sodium Access:  PIV IVF:  off Proph:  heparin  Code Status: full Family Communication: spoke with patient alone Disposition Plan:  Pending further diuresis   Consultants:  none  Procedures:  ECHO  Antibiotics:  none   HPI/Subjective:  Feeling less SOB today.  Got down to 80s with ambulation today.  Voiding frequently with incontinence when lasix is first given.  Improved chest tightness  Objective: Filed Vitals:   07/24/14 1835 07/24/14 2056 07/25/14 0500 07/25/14 0954  BP: 144/66 132/62 136/60   Pulse: 63 69 62 68  Temp: 97.8 F (36.6 C) 98.6 F (37 C) 97.9 F (36.6 C)   TempSrc: Oral Oral Oral   Resp: 16 18 18    Height:      Weight:   94.7 kg (208 lb 12.4 oz)   SpO2: 100% 100% 97%     Intake/Output Summary (Last 24 hours) at 07/25/14 1322 Last data filed at 07/25/14 1250  Gross per 24 hour  Intake    480 ml  Output  650 ml  Net   -170 ml   Filed Weights   07/23/14 1051 07/24/14 0506 07/25/14 0500  Weight: 96.525 kg (212 lb 12.8 oz) 95.119 kg (209 lb 11.2 oz) 94.7 kg (208 lb 12.4 oz)    Exam:   General:  Obese F, No acute distress  HEENT:  NCAT, MMM  Cardiovascular:  RRR, distant S1/S2, + gallop, 2+ pulses, warm extremities  Respiratory:   Diminished left base and bilateral basilar rales to the mid-back, no wheezes or rhonchi, no increased WOB  Abdomen:   NABS, soft, NT/ND  MSK:   Normal tone and bulk, 1+ bilateral LEE  Neuro:  Grossly intact  Data Reviewed: Basic Metabolic Panel:  Recent Labs Lab 07/23/14 0412 07/24/14 0401 07/25/14 0346  NA 137 138 137  K 4.5 4.2 4.2  CL 104 100 98  CO2 29 31 33*  GLUCOSE 157* 91 89  BUN 18 22 23   CREATININE 1.40* 1.65* 1.61*  CALCIUM 8.1* 8.3* 8.3*   Liver Function Tests: No results for input(s): AST, ALT, ALKPHOS, BILITOT, PROT, ALBUMIN in the last 168 hours. No results for input(s): LIPASE, AMYLASE in the last 168 hours. No results for input(s): AMMONIA in the last 168 hours. CBC:  Recent Labs Lab 07/23/14 0412 07/24/14 0401 07/25/14 0346  WBC 4.9 5.2 5.4  NEUTROABS 2.2  --   --   HGB 10.4* 9.2* 10.0*  HCT 32.9* 28.5* 31.2*  MCV 94.8 91.9 93.1  PLT 183 149* 178   Cardiac Enzymes:  Recent Labs Lab 07/23/14 0903 07/23/14 1310 07/23/14 1935  TROPONINI 0.08* 0.09* 0.09*   BNP (last 3 results)  Recent Labs  07/23/14 0422  BNP 1275.8*    ProBNP (last 3 results)  Recent Labs  10/17/13 1508 10/23/13 1438  PROBNP 6565.0* 4105.00*    CBG:  Recent Labs Lab 07/24/14 0732 07/24/14 0821 07/25/14 0730  GLUCAP 67* 108* 70    No results found for this or any previous visit (from the past 240 hour(s)).   Studies: No results found.  Scheduled Meds: . allopurinol  200 mg Oral Daily  . antiseptic oral rinse  7 mL Mouth Rinse BID  . aspirin EC  81 mg Oral Daily  . calcium carbonate  1 tablet Oral Daily  . carvedilol  25 mg Oral BID WC  . citalopram  10 mg Oral Daily  . digoxin  125 mcg Oral Daily  . ferrous sulfate  300 mg Oral Q breakfast  . furosemide  40 mg Intravenous BID  . heparin  5,000 Units Subcutaneous 3 times per day  . irbesartan  300 mg Oral Daily  . isosorbide mononitrate  30 mg Oral Daily  . pantoprazole  40 mg Oral Daily   . sodium chloride  3 mL Intravenous Q12H  . spironolactone  12.5 mg Oral Daily   Continuous Infusions:   Principal Problem:   SOB (shortness of breath) Active Problems:   Hypertension   Obesity   Depression   Tachy-brady syndrome   Pacemaker-St.Jude   Gout   Arthritis   GERD (gastroesophageal reflux disease)   Chronic systolic CHF (congestive heart failure)   CKD (chronic kidney disease), stage III   COPD (chronic obstructive pulmonary disease)   Acute on chronic systolic heart failure   Time spent: 35 min    [facility] Pager [phone]. If 7PM-7AM, please contact night-coverage at [email or web address], password [facility] 07/25/2014, 1:22 PM  LOS: 2 days

## 2014-07-25 NOTE — Progress Notes (Signed)
SATURATION QUALIFICATIONS: (This note is used to comply with regulatory documentation for home oxygen)  Patient Saturations on Room Air at Rest = 98% Room Air   Patient Saturations on Room Air while Ambulating = 86% Room Air  Patient Saturations on 2 Liters of oxygen while Ambulating = 99% 2L

## 2014-07-25 NOTE — Progress Notes (Signed)
Occupational Therapy Treatment Patient Details Name: [name] MRN: [id number] DOB: Oct 26, 1922 Today's Date: 07/25/2014    History of present illness [name] is a 81 y.o. female with past medical history of tachybradycardia syndrome, hypertension, CHF, EF 25%, cardiomyopathy admitted with shortness of breath.   OT comments  Education provided in session. Plan to practice tub transfer next session.  Follow Up Recommendations  No OT follow up;Supervision/Assistance - 24 hour    Equipment Recommendations  None recommended by OT    Recommendations for Other Services      Precautions / Restrictions Precautions Precautions: Fall Restrictions Weight Bearing Restrictions: No       Mobility Bed Mobility               General bed mobility comments: not assessed  Transfers Overall transfer level: Needs assistance Equipment used: Rolling walker (2 wheeled) Transfers: Sit to/from Stand Sit to Stand: Supervision         General transfer comment: reinforced hand placement.    Balance  Min guard/Supervision for ambulation with RW-cues to get closer to walker.                                 ADL Overall ADL's : Needs assistance/impaired     Grooming: Set up;Supervision/safety;Brushing hair;Standing;Oral care;Applying deodorant                   Toilet Transfer: Supervision/safety;Ambulation;RW;Min guard (chair)       Tub/ Shower Transfer: Tub transfer (attempted see ADL section below)   Functional mobility during ADLs: Min guard;Supervision/safety;Rolling walker General ADL Comments: Educated on energy conservation techniques. Pt performed grooming at sink. educated on tub transfer technique and pt attempted to step over simulated tub, but could not clear it. Educated on alternative technique of backing to chair and swinging legs in. Educated on deep breathing technique.  Educated on safety such as use of bag on walker.       Vision                      Perception     Praxis      Cognition  Awake/Alert Behavior During Therapy: WFL for tasks assessed/performed Overall Cognitive Status: Within Functional Limits for tasks assessed                       Extremity/Trunk Assessment               Exercises     Shoulder Instructions       General Comments      Pertinent Vitals/ Pain       Pain Assessment: No/denies pain; O2 in 70's to 80's (unsure of accuracy at times) on 1.5 L of O2. Cues for deep breathing technique and took break. Trended up to 90's.  Home Living                                          Prior Functioning/Environment              Frequency Min 2X/week     Progress Toward Goals  OT Goals(current goals can now be found in the care plan section)  Progress towards OT goals: Progressing toward goals  Acute Rehab OT Goals Patient Stated Goal: not stated  OT Goal Formulation: With patient Time For Goal Achievement: 07/31/14 Potential to Achieve Goals: Good ADL Goals Pt Will Perform Grooming: with modified independence;standing Pt Will Perform Lower Body Bathing: sit to/from stand;with modified independence Pt Will Perform Lower Body Dressing: with modified independence;sit to/from stand Pt Will Transfer to Toilet: with modified independence;bedside commode;ambulating Pt Will Perform Toileting - Clothing Manipulation and hygiene: with modified independence;sit to/from stand Pt Will Perform Tub/Shower Transfer: Tub transfer;rolling walker;shower seat;ambulating Additional ADL Goal #1: Patient will be able to verbalize at least 3 energy conservation techniques   Plan Discharge plan remains appropriate    Co-evaluation                 End of Session Equipment Utilized During Treatment: Gait belt;Rolling walker;Oxygen   Activity Tolerance Patient limited by fatigue   Patient Left in chair;with call bell/phone within reach;with chair  alarm set   Nurse Communication          Time: [id number] OT Time Calculation (min): 20 min  Charges: OT General Charges $OT Visit: 1 Procedure OT Treatments $Self Care/Home Management : 8-22 mins  [name] OTR/L [id number] 07/25/2014, 12:28 PM

## 2014-07-26 LAB — UIFE/LIGHT CHAINS/TP QN, 24-HR UR
ALPHA 2 UR: DETECTED — AB
Albumin, U: DETECTED
Alpha 1, Urine: DETECTED — AB
Beta, Urine: DETECTED — AB
GAMMA UR: DETECTED — AB
Total Protein, Urine: 15 mg/dL (ref 5–24)

## 2014-07-26 LAB — CBC
HEMATOCRIT: 29.6 % — AB (ref 36.0–46.0)
HEMOGLOBIN: 9.7 g/dL — AB (ref 12.0–15.0)
MCH: 30.5 pg (ref 26.0–34.0)
MCHC: 32.8 g/dL (ref 30.0–36.0)
MCV: 93.1 fL (ref 78.0–100.0)
Platelets: 173 10*3/uL (ref 150–400)
RBC: 3.18 MIL/uL — ABNORMAL LOW (ref 3.87–5.11)
RDW: 13.5 % (ref 11.5–15.5)
WBC: 5.4 10*3/uL (ref 4.0–10.5)

## 2014-07-26 LAB — BASIC METABOLIC PANEL
Anion gap: 6 (ref 5–15)
BUN: 31 mg/dL — AB (ref 6–23)
CALCIUM: 7.9 mg/dL — AB (ref 8.4–10.5)
CO2: 30 mmol/L (ref 19–32)
CREATININE: 2.11 mg/dL — AB (ref 0.50–1.10)
Chloride: 97 mmol/L (ref 96–112)
GFR calc Af Amer: 23 mL/min — ABNORMAL LOW (ref >90)
GFR, EST NON AFRICAN AMERICAN: 20 mL/min — AB (ref >90)
GLUCOSE: 81 mg/dL (ref 70–99)
POTASSIUM: 4.5 mmol/L (ref 3.5–5.1)
Sodium: 133 mmol/L — ABNORMAL LOW (ref 135–145)

## 2014-07-26 LAB — HIV ANTIBODY (ROUTINE TESTING W REFLEX): HIV Screen 4th Generation wRfx: NONREACTIVE

## 2014-07-26 LAB — FOLATE RBC
Folate, Hemolysate: 244.8 ng/mL
Folate, RBC: 785 ng/mL (ref >498)
HEMATOCRIT: 31.2 % — AB (ref 34.0–46.6)

## 2014-07-26 LAB — TRANSFERRIN: Transferrin: 155 mg/dL — ABNORMAL LOW (ref 200–370)

## 2014-07-26 LAB — GLUCOSE, CAPILLARY: Glucose-Capillary: 76 mg/dL (ref 70–99)

## 2014-07-26 MED ORDER — FUROSEMIDE 10 MG/ML IJ SOLN
40.0000 mg | Freq: Every day | INTRAMUSCULAR | Status: DC
  Administered 2014-07-27: 40 mg via INTRAVENOUS
  Filled 2014-07-26: qty 4

## 2014-07-26 NOTE — Progress Notes (Signed)
[facility] PROGRESS NOTE  [name] [id number] DOB: 10/03/1922 DOA: 07/23/2014 PCP: Odette Fraction, MD  Brief Summary  81 year old female with history of hypertension, GERD, COPD, chronic kidney disease stage III, systolic congestive heart failure with ejection fraction of 25-30%, depression, tachycardia-bradycardia syndrome status post pacemaker placement who presented with shortness of breath. Her symptoms started approximately 2 hours prior to coming to the emergency department. She denied chest pain but had had some cough. When EMS arrived she was 82% on room air which improved to greater than 90% when placed on nonrebreather. Nitroglycerin improved her shortness of breath. She stated she had been compliant with her Lasix. She denied fevers or other symptoms of infection. She also denied lower extremity swelling. In the emergency department her troponin was 0.08 and her EKG was stable from prior. BNP was 1275 and chest x-ray demonstrated pulmonary vascular congestion. She was admitted for acute on chronic systolic heart failure exacerbation and was started on IV Lasix.   HPI/Subjective:  Denies any shortness of breath, had SOB with walking yesterday, patient likely will require oxygen on discharge.  Assessment/Plan  Acute hypoxic respiratory failure secondary to acute on chronic systolic heart failure due to ischemic cardiomyopathy:  BNP 1275, vascular congestions on CXR.  2-D echo 10/28/10 showed EF 25-30% with grade 1 diastolic dysfunction, but EF significantly improve on ECHO during this admission.  On Lasix 40 mg daily at home.  Feeling better.   - Tele:  NSR, ECG:  Stable Q-waves in inferior leads  -2d echo:  Ejection fraction 45-50% with basal to mid inferior hypokinesis, grade 1 diastolic dysfunction - Incontinent some of the time, so I/O not totally accurate - Troponins:  Repeatedly 0.08, stable and likely elevated from CKD - Continue aspirin, coreg and ARB -  Continue imdur -Continue lasix 40 mg IV BID and Aldactone, continue current diuresis.  Elevated troponin -Slightly elevated troponin at 0.09, denies any chest pain. -This is likely secondary to concurrent acute systolic CHF and CKD.  COPD: stable. No signs of acute exacerbation -Albuterol nebulizer when necessary  CKD-III:  -Baseline creatinine 1.4-1.7. Creatinine trending up slightly but still in her normal range. -Creatinine increased to 2.1, decrease diuresis.  Tachybradycardia syndrome: Heart rate is controlled. -On pacemaker -Continue digoxin  Mitral valve regurgitation, moderate with tethered valve leaflet -  Per cardiology  Gout: Stable. -Continue allopurinol  Hypertension:  Blood pressures gradually trending down -  Continue ARB, BB, lasix -  Adding spironolactone  Normocytic anemia, progressive since admission despite diuresis, no obvious bleeding -  Iron studies, b12, folate, TSH, occult stool -  SPEP/UPEP   Globulin gap -  SPEP, HCV is negative.   Diet:  Low sodium Access:  PIV IVF:  off Proph:  heparin  Code Status: full Family Communication: spoke with patient alone Disposition Plan:  Pending further diuresis   Consultants:  none  Procedures:  ECHO  Antibiotics:  none   Objective: Filed Vitals:   07/25/14 1430 07/25/14 2000 07/26/14 0500 07/26/14 0942  BP: 111/58 144/63 139/54   Pulse: 72 69 64 68  Temp: 97.8 F (36.6 C) 97.7 F (36.5 C) 98 F (36.7 C)   TempSrc: Oral Oral Oral   Resp: 18     Height:   5' 1.5" (1.562 m)   Weight:   94.303 kg (207 lb 14.4 oz)   SpO2: 100% 100% 98%     Intake/Output Summary (Last 24 hours) at 07/26/14 1212 Last data filed at 07/26/14 0944  Gross  per 24 hour  Intake    723 ml  Output    400 ml  Net    323 ml   Filed Weights   07/24/14 0506 07/25/14 0500 07/26/14 0500  Weight: 95.119 kg (209 lb 11.2 oz) 94.7 kg (208 lb 12.4 oz) 94.303 kg (207 lb 14.4 oz)    Exam:   General:  Obese F, No  acute distress  HEENT:  NCAT, MMM  Cardiovascular:  RRR, distant S1/S2, + gallop, 2+ pulses, warm extremities  Respiratory:  Diminished left base and bilateral basilar rales to the mid-back, no wheezes or rhonchi, no increased WOB  Abdomen:   NABS, soft, NT/ND  MSK:   Normal tone and bulk, 1+ bilateral LEE  Neuro:  Grossly intact  Data Reviewed: Basic Metabolic Panel:  Recent Labs Lab 07/23/14 0412 07/24/14 0401 07/25/14 0346 07/26/14 0500  NA 137 138 137 133*  K 4.5 4.2 4.2 4.5  CL 104 100 98 97  CO2 29 31 33* 30  GLUCOSE 157* 91 89 81  BUN 18 22 23  31*  CREATININE 1.40* 1.65* 1.61* 2.11*  CALCIUM 8.1* 8.3* 8.3* 7.9*   Liver Function Tests: No results for input(s): AST, ALT, ALKPHOS, BILITOT, PROT, ALBUMIN in the last 168 hours. No results for input(s): LIPASE, AMYLASE in the last 168 hours. No results for input(s): AMMONIA in the last 168 hours. CBC:  Recent Labs Lab 07/23/14 0412 07/24/14 0401 07/25/14 0346 07/26/14 0500  WBC 4.9 5.2 5.4 5.4  NEUTROABS 2.2  --   --   --   HGB 10.4* 9.2* 10.0* 9.7*  HCT 32.9* 28.5* 31.2* 29.6*  MCV 94.8 91.9 93.1 93.1  PLT 183 149* 178 173   Cardiac Enzymes:  Recent Labs Lab 07/23/14 0903 07/23/14 1310 07/23/14 1935  TROPONINI 0.08* 0.09* 0.09*   BNP (last 3 results)  Recent Labs  07/23/14 0422  BNP 1275.8*    ProBNP (last 3 results)  Recent Labs  10/17/13 1508 10/23/13 1438  PROBNP 6565.0* 4105.00*    CBG:  Recent Labs Lab 07/24/14 0732 07/24/14 0821 07/25/14 0730 07/26/14 0756  GLUCAP 67* 108* 70 76    No results found for this or any previous visit (from the past 240 hour(s)).   Studies: No results found.  Scheduled Meds: . allopurinol  200 mg Oral Daily  . antiseptic oral rinse  7 mL Mouth Rinse BID  . aspirin EC  81 mg Oral Daily  . calcium carbonate  1 tablet Oral Daily  . carvedilol  25 mg Oral BID WC  . citalopram  10 mg Oral Daily  . digoxin  125 mcg Oral Daily  . ferrous  sulfate  300 mg Oral Q breakfast  . furosemide  40 mg Intravenous BID  . heparin  5,000 Units Subcutaneous 3 times per day  . irbesartan  300 mg Oral Daily  . isosorbide mononitrate  30 mg Oral Daily  . pantoprazole  40 mg Oral Daily  . sodium chloride  3 mL Intravenous Q12H  . spironolactone  12.5 mg Oral Daily   Continuous Infusions:   Principal Problem:   SOB (shortness of breath) Active Problems:   Hypertension   Obesity   Depression   Tachy-brady syndrome   Pacemaker-St.Jude   Gout   Arthritis   GERD (gastroesophageal reflux disease)   Chronic systolic CHF (congestive heart failure)   CKD (chronic kidney disease), stage III   COPD (chronic obstructive pulmonary disease)   Acute on chronic systolic heart  failure   Time spent: 35 min    [facility] Pager [phone]. If 7PM-7AM, please contact night-coverage at [email or web address], password [facility] 07/26/2014, 12:12 PM  LOS: 3 days

## 2014-07-26 NOTE — Progress Notes (Signed)
OT Cancellation Note  Patient Details Name: [name] MRN: [id number] DOB: [birth year]   Cancelled Treatment:    Reason Eval/Treat Not Completed: Patient declined, no reason specified. Pt refusing all therapy today stating "I am not getting up today."  Su Ley, OTR/L Occupational Therapist [phone] (pager)  07/26/2014, 12:00 PM

## 2014-07-26 NOTE — Progress Notes (Signed)
PT Cancellation Note  Patient Details Name: HAISLEIGH STICK MRN: [id number] DOB: 1923-05-24   Cancelled Treatment:    Reason Eval/Treat Not Completed: Other (comment) (Refused adamantly.  "I am not getting up today.")   Irwin Brakeman F 07/26/2014, 11:48 AM  [facility] [phone] (pager)

## 2014-07-26 NOTE — Progress Notes (Signed)
Heart Failure Navigator Consult Note  Presentation: [name] is a 81 y.o. female with PMH of hypertension, GERD, COPD, gout, chronic kidney disease-stage III, congestive heart failure with EF 25-30%, depression, tachycardia-bradycardia syndrome, pacemaker placement, who presents with shortness distress.  Patient SOB started 2 hours prior to arrival. She reports her entire body was got hot and she woke up with shortness of breath. She has mild cough without sputum production. She denies any chest pain. Per EMS, patient's oxygen saturation was 82% on room air, which improve to above 90% when placed on nonrebreather. She was given 2 nitroglycerin tablets which improved her difficult breathing. She states she has been compliant with her Lasix medication. When I evaluated pt in ED, her SOB has resolved. Patient denies fever, chills, fatigue, headaches, cough, chest pain, SOB, abdominal pain, diarrhea, constipation, dysuria, urgency, frequency, hematuria, skin rashes, joint pain or leg swelling. No unilateral weakness, numbness or tingling sensations. No vision change or hearing loss.  In ED, patient was found to have troponin 0.08, normal temperature, stable renal function. EKG showed old left bundle blockage and QTC 490. BNP=1275. Chest x-ray showed mild pulmonary congestion. Patient is admitted to inpatient for further evaluation and treatment.  Past Medical History  Diagnosis Date  . Hypertension   . Obesity   . Depression   . Tachy-brady syndrome     a. 03/2006 s/p SJM 5356 Verity ADx XLDR DC PPM (ser#: [id number]).  . Gout   . Chronic systolic CHF (congestive heart failure)     a. 10/2013 Echo: EF 25-30%, diff HK, Gr 1 DD, triv AI, mild to mod MR, dev dil LA, mild RV dysfxn, mildly dil RA, mod TR.  . Cardiomyopathy     a. ? ischemic vs non-ischemic;  b. 09/2013 Echo: EF 25-30%.  Marland Kitchen COPD (chronic obstructive pulmonary disease)   . Glaucoma of both eyes   . GERD (gastroesophageal reflux disease)    . Presence of permanent cardiac pacemaker   . CKD (chronic kidney disease), stage III   . Kidney stones   . Arthritis     "knees" (07/23/2014)    History   Social History  . Marital Status: Widowed    Spouse Name: N/A  . Number of Children: N/A  . Years of Education: N/A   Social History Main Topics  . Smoking status: Former Smoker    Types: Cigarettes  . Smokeless tobacco: Never Used     Comment: "never inhaled cigarettes; just lit them"  . Alcohol Use: Yes     Comment: 07/23/2014 "I used to drink beer; nothing in 3-4 years"  . Drug Use: No  . Sexual Activity: No   Other Topics Concern  . None   Social History Narrative    ECHO:Study Conclusions  - Left ventricle: The cavity size was normal. Wall thickness was increased in a pattern of moderate LVH. Systolic function was mildly reduced. The estimated ejection fraction was in the range of 45% to 50%. Basal to mid inferior hypokinesis. Doppler parameters are consistent with abnormal left ventricular relaxation (grade 1 diastolic dysfunction). The E/e&' ratio is >15, suggesting elevated LV filling pressure. - Aortic valve: Trileaflet. Sclerosis without stenosis. There was mild regurgitation. - Mitral valve: Mild leaflet thickening and posterior leaflet tethering. There is mild to moderate posteriorly directed regurgitation. - Left atrium: Severely dilated at 48 ml/m2. - Right ventricle: The cavity size was normal. Wall thickness was normal. Pacer wire or catheter noted in right ventricle. Systolic function was normal. -  Right atrium: The atrium was mildly dilated. Pacer wire or catheter noted in right atrium. - Tricuspid valve: There was moderate regurgitation. - Pulmonary arteries: PA peak pressure: 42 mm Hg (S).  Impressions:  - Compared to the prior echo in 10/2013, the EF has markedly improved to 45-50%. There is basal to mid inferior hypokinesis, tetherting the posterior mitral  leaflet with mild to moderate mitral regurgitation. Stage 1 diastolic dysfunction with high LV filling pressures. RVSP of 42 mmHg.  BNP    Component Value Date/Time   BNP 1275.8* 07/23/2014 0422    ProBNP    Component Value Date/Time   PROBNP 4105.00* 10/23/2013 1438     Education Assessment and Provision:  Detailed education and instructions provided on heart failure disease management including the following:  Signs and symptoms of Heart Failure When to call the physician Importance of daily weights Low sodium diet Fluid restriction Medication management Anticipated future follow-up appointments  Patient education given on each of the above topics.  Patient acknowledges understanding and acceptance of all instructions.  I spoke at length with Ms. Aujla and her home aid regarding her HF.  We reviewed topics listed above.  Ms. Farrin seems to have very little insight into her own health.  Luckily she has her granddaughter and nurse aid that stay with her/assist her at home.  She very much enjoys [employer] chicken biscuits (1140 mg of sodium) and previously was eating those several times a week.  We discussed a low sodium diet and high sodium foods to avoid.  She has a scale however was not weighing daily.  I have encouraged her to weigh daily and emphasized when to call the physician.   Education Materials:  "Living Better With Heart Failure" Booklet, Daily Weight Tracker Tool    High Risk Criteria for Readmission and/or Poor Patient Outcomes:   EF <30%- No-45-50% and stage 1 dias dys  2 or more admissions in 6 months- No  Difficult social situation- No-lives with granddaughter  Demonstrates medication noncompliance- No   Barriers of Care:  Knowledge and compliance  Discharge Planning:   Plans to return home with granddaughter and nurse aid 2 hours per day.  She will discharge home with [facility] for symptom management and ongoing education and compliance  reinforcement.

## 2014-07-27 DIAGNOSIS — F329 Major depressive disorder, single episode, unspecified: Secondary | ICD-10-CM | POA: Diagnosis not present

## 2014-07-27 DIAGNOSIS — E669 Obesity, unspecified: Secondary | ICD-10-CM | POA: Diagnosis not present

## 2014-07-27 DIAGNOSIS — I10 Essential (primary) hypertension: Secondary | ICD-10-CM | POA: Diagnosis not present

## 2014-07-27 LAB — PROTEIN ELECTROPHORESIS, SERUM
ALBUMIN ELP: 44.9 % — AB (ref 55.8–66.1)
ALPHA-1-GLOBULIN: 6.1 % — AB (ref 2.9–4.9)
Alpha-2-Globulin: 11 % (ref 7.1–11.8)
Beta 2: 7 % — ABNORMAL HIGH (ref 3.2–6.5)
Beta Globulin: 4.8 % (ref 4.7–7.2)
GAMMA GLOBULIN: 26.2 % — AB (ref 11.1–18.8)
M-Spike, %: NOT DETECTED g/dL
Total Protein ELP: 6.5 g/dL (ref 6.0–8.3)

## 2014-07-27 LAB — BASIC METABOLIC PANEL
Anion gap: 5 (ref 5–15)
BUN: 35 mg/dL — AB (ref 6–23)
CO2: 32 mmol/L (ref 19–32)
CREATININE: 2.38 mg/dL — AB (ref 0.50–1.10)
Calcium: 8.3 mg/dL — ABNORMAL LOW (ref 8.4–10.5)
Chloride: 96 mmol/L (ref 96–112)
GFR calc Af Amer: 20 mL/min — ABNORMAL LOW (ref >90)
GFR calc non Af Amer: 17 mL/min — ABNORMAL LOW (ref >90)
GLUCOSE: 86 mg/dL (ref 70–99)
Potassium: 4.7 mmol/L (ref 3.5–5.1)
SODIUM: 133 mmol/L — AB (ref 135–145)

## 2014-07-27 LAB — CBC
HCT: 31.5 % — ABNORMAL LOW (ref 36.0–46.0)
Hemoglobin: 10.1 g/dL — ABNORMAL LOW (ref 12.0–15.0)
MCH: 29.1 pg (ref 26.0–34.0)
MCHC: 32.1 g/dL (ref 30.0–36.0)
MCV: 90.8 fL (ref 78.0–100.0)
Platelets: 192 10*3/uL (ref 150–400)
RBC: 3.47 MIL/uL — AB (ref 3.87–5.11)
RDW: 13.1 % (ref 11.5–15.5)
WBC: 5.5 10*3/uL (ref 4.0–10.5)

## 2014-07-27 LAB — GLUCOSE, CAPILLARY
GLUCOSE-CAPILLARY: 59 mg/dL — AB (ref 70–99)
Glucose-Capillary: 80 mg/dL (ref 70–99)

## 2014-07-27 MED ORDER — FUROSEMIDE 40 MG PO TABS: 40.0000 mg | ORAL_TABLET | Freq: Two times a day (BID) | ORAL | Status: DC

## 2014-07-27 NOTE — Discharge Summary (Signed)
Physician Discharge Summary  [name] [id number] DOB: 09-19-1922 DOA: 07/23/2014  PCP: Odette Fraction, MD  Admit date: 07/23/2014 Discharge date: 07/27/2014  Time spent: 40 minutes  Recommendations for Outpatient Follow-up:  1. Follow-up with primary care physician in one week. 2. Check BMP in 7 days, follow up on creatinine. Creatinine on discharge is 2.3. 3. Hold valsartan, consider to restart after kidney function is stabilized.  Discharge Diagnoses:  Principal Problem:   SOB (shortness of breath) Active Problems:   Hypertension   Obesity   Depression   Tachy-brady syndrome   Pacemaker-St.Jude   Gout   Arthritis   GERD (gastroesophageal reflux disease)   Chronic systolic CHF (congestive heart failure)   CKD (chronic kidney disease), stage III   COPD (chronic obstructive pulmonary disease)   Acute on chronic systolic heart failure   Discharge Condition: Stable  Diet recommendation: Heart healthy  Filed Weights   07/25/14 0500 07/26/14 0500 07/27/14 0500  Weight: 94.7 kg (208 lb 12.4 oz) 94.303 kg (207 lb 14.4 oz) 95.981 kg (211 lb 9.6 oz)    History of present illness:  [name] is a 81 y.o. female with PMH of hypertension, GERD, COPD, gout, chronic kidney disease-stage III, congestive heart failure with EF 25-30%, depression, tachycardia-bradycardia syndrome, pacemaker placement, who presents with shortness distress.  Patient SOB started 2 hours prior to arrival. She reports her entire body was got hot and she woke up with shortness of breath. She has mild cough without sputum production. She denies any chest pain. Per EMS, patient's oxygen saturation was 82% on room air, which improve to above 90% when placed on nonrebreather. She was given 2 nitroglycerin tablets which improved her difficult breathing. She states she has been compliant with her Lasix medication. When I evaluated pt in ED, her SOB has resolved. Patient denies fever, chills, fatigue,  headaches, cough, chest pain, SOB, abdominal pain, diarrhea, constipation, dysuria, urgency, frequency, hematuria, skin rashes, joint pain or leg swelling. No unilateral weakness, numbness or tingling sensations. No vision change or hearing loss.  In ED, patient was found to have troponin 0.08, normal temperature, stable renal function. EKG showed old left bundle blockage and QTC 490. BNP=1275. Chest x-ray showed mild pulmonary congestion. Patient is admitted to inpatient for further evaluation and treatment.  Hospital Course:    Acute hypoxic respiratory failure secondary to acute on chronic combined heart failure due to ischemic cardiomyopathy: BNP 1275, vascular congestions on CXR. 2-D echo 10/28/10 showed EF 25-30% with grade 1 diastolic dysfunction, but EF significantly improve on ECHO during this admission. On Lasix 40 mg daily at home. Feeling better.  - Tele: NSR, ECG: Stable Q-waves in inferior leads  -2d echo: Ejection fraction 45-50% with basal to mid inferior hypokinesis, grade 1 diastolic dysfunction, LVEF improved significantly from June 2015 which was 25-30%. - Troponins: Repeatedly 0.08, stable and likely elevated from CKD - Home heart failure medications including aspirin, Coreg, Imdur and Lasix continued. - On discharge her valsartan discontinued because of worsening renal function.  Elevated troponin -Slightly elevated troponin at 0.09, denies any chest pain. -This is likely secondary to concurrent acute systolic CHF and CKD.  COPD: stable. No signs of acute exacerbation -Albuterol nebulizer when necessary  CKD-III:  -Baseline creatinine 1.4-1.7. Creatinine trending up slightly but still in her normal range. -Creatinine increased to 2.3 on the day of discharge, Diovan discontinued. -Patient discharged on 40 mg of Lasix twice a day, check BMP in 1 week.  Tachybradycardia syndrome: Heart  rate is controlled. -On pacemaker -Continue digoxin  Mitral valve  regurgitation, moderate with tethered valve leaflet - Per cardiology  Gout: Stable. -Continue allopurinol  Hypertension: Blood pressures gradually trending down - Continue beta blockers and Lasix Imdur.  Normocytic anemia, progressive since admission despite diuresis, no obvious bleeding - Iron studies, b12, folate, TSH, occult stool  Globulin gap - SPEP no monoclonal gammopathy, HCV is negative.  Procedures:  Stable  Consultations:  None  Discharge Exam: Filed Vitals:   07/27/14 0500  BP: 104/52  Pulse: 63  Temp: 97.9 F (36.6 C)  Resp: 18   General: Alert and awake, oriented x3, not in any acute distress. HEENT: anicteric sclera, pupils reactive to light and accommodation, EOMI CVS: S1-S2 clear, no murmur rubs or gallops Chest: clear to auscultation bilaterally, no wheezing, rales or rhonchi Abdomen: soft nontender, nondistended, normal bowel sounds, no organomegaly Extremities: no cyanosis, clubbing or edema noted bilaterally Neuro: Cranial nerves II-XII intact, no focal neurological deficits  Discharge Instructions   Discharge Instructions    Diet - low sodium heart healthy    Complete by:  As directed      Increase activity slowly    Complete by:  As directed           Current Discharge Medication List    CONTINUE these medications which have CHANGED   Details  furosemide (LASIX) 40 MG tablet Take 1 tablet (40 mg total) by mouth 2 (two) times daily. Qty: 60 tablet, Refills: 0   Associated Diagnoses: Chronic systolic CHF (congestive heart failure); Essential hypertension; Chronic kidney disease, unspecified stage      CONTINUE these medications which have NOT CHANGED   Details  acetaminophen (TYLENOL) 650 MG CR tablet Take 650 mg by mouth every 8 (eight) hours as needed for pain.    allopurinol (ZYLOPRIM) 100 MG tablet TAKE 2 TABLETS BY MOUTH ONCE DAILY Qty: 60 tablet, Refills: 3    aspirin 81 MG tablet Take 1 tablet (81 mg total) by mouth  daily. Qty: 30 tablet, Refills: 11    calcium carbonate (TUMS - DOSED IN MG ELEMENTAL CALCIUM) 500 MG chewable tablet Chew 1 tablet by mouth daily.    carvedilol (COREG) 25 MG tablet TAKE 1 TABLET BY MOUTH TWICE DAILY WITH A MEAL Qty: 60 tablet, Refills: 10    citalopram (CELEXA) 10 MG tablet TAKE 1 TABLET BY MOUTH DAILY Qty: 30 tablet, Refills: 5    DIGOX 125 MCG tablet TAKE ONE TABLET BY MOUTH DAILY Qty: 30 tablet, Refills: 3    FERROUS SULFATE PO Take 65 mg by mouth daily.    omeprazole (PRILOSEC) 40 MG capsule TAKE 1 CAPSULE BY MOUTH DAILY Qty: 30 capsule, Refills: 11      STOP taking these medications     valsartan (DIOVAN) 320 MG tablet        No Known Allergies Follow-up Information    Follow up with [facility].   Why:  Home Health Services: Physical Therapy and Registered Nurse.    Contact information:   [address] [place] [address] [phone]       Follow up with [facility].   Why:  oxygen for durable medical equipment   Contact information:   [address] [place] [address] [phone]       Follow up with [facility] TOM, MD In 1 week.   Specialty:  Family Medicine   Contact information:   [id number]  [place] [address] [phone]        The results of significant diagnostics from this hospitalization (including imaging, microbiology, ancillary and laboratory) are listed below for reference.    Significant Diagnostic Studies: Dg Chest Portable 1 View  07/23/2014   CLINICAL DATA:  Shortness of breath  EXAM: PORTABLE CHEST - 1 VIEW  COMPARISON:  10/17/2013  FINDINGS: Unchanged appearance of dual-chamber pacer leads from the right, with 2 right ventricular leads.  Stable cardiomegaly, moderate aortic tortuosity, and vascular enlargement of the hila. Pulmonary venous congestion without edema. No pneumonia, definitive effusion (blunting of the left costophrenic sulcus is  chronic and likely scarring), or pneumothorax. There is chronic widening of the upper right mediastinum, favor ectatic vasculature.  IMPRESSION: 1. Cardiomegaly and pulmonary venous congestion. 2. COPD.   Electronically Signed   By: Monte Fantasia M.D.   On: 07/23/2014 04:41    Microbiology: No results found for this or any previous visit (from the past 240 hour(s)).   Labs: Basic Metabolic Panel:  Recent Labs Lab 07/23/14 0412 07/24/14 0401 07/25/14 0346 07/26/14 0500 07/27/14 0335  NA 137 138 137 133* 133*  K 4.5 4.2 4.2 4.5 4.7  CL 104 100 98 97 96  CO2 29 31 33* 30 32  GLUCOSE 157* 91 89 81 86  BUN 18 22 23  31* 35*  CREATININE 1.40* 1.65* 1.61* 2.11* 2.38*  CALCIUM 8.1* 8.3* 8.3* 7.9* 8.3*   Liver Function Tests: No results for input(s): AST, ALT, ALKPHOS, BILITOT, PROT, ALBUMIN in the last 168 hours. No results for input(s): LIPASE, AMYLASE in the last 168 hours. No results for input(s): AMMONIA in the last 168 hours. CBC:  Recent Labs Lab 07/23/14 0412 07/24/14 0401 07/25/14 0346 07/26/14 0500 07/27/14 0335  WBC 4.9 5.2 5.4 5.4 5.5  NEUTROABS 2.2  --   --   --   --   HGB 10.4* 9.2* 10.0* 9.7* 10.1*  HCT 32.9* 28.5* 31.2*  31.2* 29.6* 31.5*  MCV 94.8 91.9 93.1 93.1 90.8  PLT 183 149* 178 173 192   Cardiac Enzymes:  Recent Labs Lab 07/23/14 0903 07/23/14 1310 07/23/14 1935  TROPONINI 0.08* 0.09* 0.09*   BNP: BNP (last 3 results)  Recent Labs  07/23/14 0422  BNP 1275.8*    ProBNP (last 3 results)  Recent Labs  10/17/13 1508 10/23/13 1438  PROBNP 6565.0* 4105.00*    CBG:  Recent Labs Lab 07/24/14 0821 07/25/14 0730 07/26/14 0756 07/27/14 0745 07/27/14 0809  GLUCAP 108* 70 76 59* 80       Signed:  Iaan Oregel A  [facility] 07/27/2014, 12:21 PM

## 2014-07-27 NOTE — Progress Notes (Signed)
Physical Therapy Treatment Patient Details Name: [name] MRN: [id number] DOB: December 06, 1922 Today's Date: 07/27/2014    History of Present Illness [name] is a 81 y.o. female with past medical history of tachybradycardia syndrome, hypertension, CHF, EF 25%, cardiomyopathy admitted with shortness of breath.    PT Comments    Pt admitted with above diagnosis. Pt currently with functional limitations due to endurance deficits and education for RW management.  Pt progressing with mobility but still needs cues at times for safety.  Will have needed help at home.  Continue to follow acutely.  Pt will benefit from skilled PT to increase their independence and safety with mobility to allow discharge to the venue listed below.    Follow Up Recommendations  Home health PT;Supervision/Assistance - 24 hour     Equipment Recommendations  Rolling walker with 5" wheels    Recommendations for Other Services       Precautions / Restrictions Precautions Precautions: Fall Restrictions Weight Bearing Restrictions: No    Mobility  Bed Mobility               General bed mobility comments: in chair on arrival.   Transfers Overall transfer level: Needs assistance Equipment used: Rolling walker (2 wheeled) Transfers: Sit to/from Stand Sit to Stand: Supervision         General transfer comment: Took incr time to power up but did it on her own with time.    Ambulation/Gait Ambulation/Gait assistance: Min guard Ambulation Distance (Feet): 150 Feet Assistive device: Rolling walker (2 wheeled) Gait Pattern/deviations: Decreased step length - right;Decreased step length - left Gait velocity: slow Gait velocity interpretation: Below normal speed for age/gender General Gait Details: Pt ambulates with RW with cues to stay close to RW.  O2 sats would not register without O2.  Wtih 2LO2 sats at 94-99%.     Stairs            Wheelchair Mobility    Modified Rankin (Stroke  Patients Only)       Balance           Standing balance support: Bilateral upper extremity supported;During functional activity Standing balance-Leahy Scale: Fair Standing balance comment: Can stand statically for up to 1 minute without UE support.               High level balance activites: Turns;Sudden stops;Direction changes High Level Balance Comments: min guard assist needed for above.  Decr safety at times with RW.      Cognition Arousal/Alertness: Awake/alert Behavior During Therapy: WFL for tasks assessed/performed Overall Cognitive Status: Within Functional Limits for tasks assessed                      Exercises General Exercises - Lower Extremity Ankle Circles/Pumps: AROM;Both;10 reps;Seated Long Arc Quad: AROM;Both;10 reps;Seated Hip Flexion/Marching: AROM;Both;10 reps;Seated    General Comments        Pertinent Vitals/Pain Pain Assessment: No/denies pain  O2 on RA would not register on monitor.  O2 on 2LO2 94-99%.      Home Living                      Prior Function            PT Goals (current goals can now be found in the care plan section) Progress towards PT goals: Progressing toward goals    Frequency  Min 3X/week    PT Plan Current plan remains appropriate  Co-evaluation             End of Session Equipment Utilized During Treatment: Gait belt;Oxygen Activity Tolerance: Patient limited by fatigue Patient left: in chair;with chair alarm set;with call bell/phone within reach     Time: 1156-1210 PT Time Calculation (min) (ACUTE ONLY): 14 min  Charges:  $Gait Training: 8-22 mins                    G Codes:      WhiteGodfrey Pick 08-02-2014, 2:14 PM  [place] Miaa Latterell,PT Acute Rehabilitation [phone] (pager)

## 2014-07-27 NOTE — Progress Notes (Signed)
SATURATION QUALIFICATIONS: (This note is used to comply with regulatory documentation for home oxygen)  Patient Saturations on Room Air at Rest = 85-88%  Patient Saturations on Room Air while Ambulating = Would not register on sat monitor  Patient Saturations on 2 Liters of oxygen while Ambulating = 94-99%  Please briefly explain why patient needs home oxygen:Pt desats on RA.  O2 >90% with 2LO2 in place.  Thanks. Ester [phone] (pager)

## 2014-07-28 DIAGNOSIS — F329 Major depressive disorder, single episode, unspecified: Secondary | ICD-10-CM | POA: Diagnosis not present

## 2014-07-28 DIAGNOSIS — I10 Essential (primary) hypertension: Secondary | ICD-10-CM | POA: Diagnosis not present

## 2014-07-28 DIAGNOSIS — E669 Obesity, unspecified: Secondary | ICD-10-CM | POA: Diagnosis not present

## 2014-07-29 DIAGNOSIS — I10 Essential (primary) hypertension: Secondary | ICD-10-CM | POA: Diagnosis not present

## 2014-07-29 DIAGNOSIS — F329 Major depressive disorder, single episode, unspecified: Secondary | ICD-10-CM | POA: Diagnosis not present

## 2014-07-29 DIAGNOSIS — E669 Obesity, unspecified: Secondary | ICD-10-CM | POA: Diagnosis not present

## 2014-07-30 ENCOUNTER — Telehealth: Payer: Self-pay | Admitting: Family Medicine

## 2014-07-30 DIAGNOSIS — I10 Essential (primary) hypertension: Secondary | ICD-10-CM | POA: Diagnosis not present

## 2014-07-30 DIAGNOSIS — F329 Major depressive disorder, single episode, unspecified: Secondary | ICD-10-CM | POA: Diagnosis not present

## 2014-07-30 DIAGNOSIS — E669 Obesity, unspecified: Secondary | ICD-10-CM | POA: Diagnosis not present

## 2014-07-30 NOTE — Telephone Encounter (Signed)
[name] aware

## 2014-07-30 NOTE — Telephone Encounter (Signed)
[facility] calling to see if dr pickard would be her attending physician  [phone] ext [phone]

## 2014-07-31 DIAGNOSIS — F329 Major depressive disorder, single episode, unspecified: Secondary | ICD-10-CM | POA: Diagnosis not present

## 2014-07-31 DIAGNOSIS — I10 Essential (primary) hypertension: Secondary | ICD-10-CM | POA: Diagnosis not present

## 2014-07-31 DIAGNOSIS — E669 Obesity, unspecified: Secondary | ICD-10-CM | POA: Diagnosis not present

## 2014-07-31 LAB — IMMUNOFIXATION ELECTROPHORESIS
IGM, SERUM: 33 mg/dL — AB (ref 52–322)
IgA: 353 mg/dL (ref 69–380)
IgG (Immunoglobin G), Serum: 1980 mg/dL — ABNORMAL HIGH (ref 690–1700)
Total Protein ELP: 7.5 g/dL (ref 6.0–8.3)

## 2014-08-01 DIAGNOSIS — I10 Essential (primary) hypertension: Secondary | ICD-10-CM | POA: Diagnosis not present

## 2014-08-01 DIAGNOSIS — E119 Type 2 diabetes mellitus without complications: Secondary | ICD-10-CM | POA: Diagnosis not present

## 2014-08-01 DIAGNOSIS — I5023 Acute on chronic systolic (congestive) heart failure: Secondary | ICD-10-CM | POA: Diagnosis not present

## 2014-08-01 DIAGNOSIS — Z950 Presence of cardiac pacemaker: Secondary | ICD-10-CM | POA: Diagnosis not present

## 2014-08-01 DIAGNOSIS — M6281 Muscle weakness (generalized): Secondary | ICD-10-CM | POA: Diagnosis not present

## 2014-08-01 DIAGNOSIS — N183 Chronic kidney disease, stage 3 (moderate): Secondary | ICD-10-CM | POA: Diagnosis not present

## 2014-08-01 DIAGNOSIS — I5022 Chronic systolic (congestive) heart failure: Secondary | ICD-10-CM | POA: Diagnosis not present

## 2014-08-01 DIAGNOSIS — E669 Obesity, unspecified: Secondary | ICD-10-CM | POA: Diagnosis not present

## 2014-08-01 DIAGNOSIS — J449 Chronic obstructive pulmonary disease, unspecified: Secondary | ICD-10-CM | POA: Diagnosis not present

## 2014-08-01 DIAGNOSIS — F329 Major depressive disorder, single episode, unspecified: Secondary | ICD-10-CM | POA: Diagnosis not present

## 2014-08-01 DIAGNOSIS — M818 Other osteoporosis without current pathological fracture: Secondary | ICD-10-CM | POA: Diagnosis not present

## 2014-08-01 DIAGNOSIS — I129 Hypertensive chronic kidney disease with stage 1 through stage 4 chronic kidney disease, or unspecified chronic kidney disease: Secondary | ICD-10-CM | POA: Diagnosis not present

## 2014-08-01 DIAGNOSIS — R609 Edema, unspecified: Secondary | ICD-10-CM | POA: Diagnosis not present

## 2014-08-01 DIAGNOSIS — Z9981 Dependence on supplemental oxygen: Secondary | ICD-10-CM | POA: Diagnosis not present

## 2014-08-02 DIAGNOSIS — E669 Obesity, unspecified: Secondary | ICD-10-CM | POA: Diagnosis not present

## 2014-08-02 DIAGNOSIS — J449 Chronic obstructive pulmonary disease, unspecified: Secondary | ICD-10-CM | POA: Diagnosis not present

## 2014-08-02 DIAGNOSIS — I129 Hypertensive chronic kidney disease with stage 1 through stage 4 chronic kidney disease, or unspecified chronic kidney disease: Secondary | ICD-10-CM | POA: Diagnosis not present

## 2014-08-02 DIAGNOSIS — N183 Chronic kidney disease, stage 3 (moderate): Secondary | ICD-10-CM | POA: Diagnosis not present

## 2014-08-02 DIAGNOSIS — F329 Major depressive disorder, single episode, unspecified: Secondary | ICD-10-CM | POA: Diagnosis not present

## 2014-08-02 DIAGNOSIS — I5023 Acute on chronic systolic (congestive) heart failure: Secondary | ICD-10-CM | POA: Diagnosis not present

## 2014-08-02 DIAGNOSIS — I10 Essential (primary) hypertension: Secondary | ICD-10-CM | POA: Diagnosis not present

## 2014-08-02 DIAGNOSIS — Z9981 Dependence on supplemental oxygen: Secondary | ICD-10-CM | POA: Diagnosis not present

## 2014-08-02 DIAGNOSIS — E119 Type 2 diabetes mellitus without complications: Secondary | ICD-10-CM | POA: Diagnosis not present

## 2014-08-02 DIAGNOSIS — Z950 Presence of cardiac pacemaker: Secondary | ICD-10-CM | POA: Diagnosis not present

## 2014-08-03 DIAGNOSIS — E669 Obesity, unspecified: Secondary | ICD-10-CM | POA: Diagnosis not present

## 2014-08-03 DIAGNOSIS — I10 Essential (primary) hypertension: Secondary | ICD-10-CM | POA: Diagnosis not present

## 2014-08-03 DIAGNOSIS — F329 Major depressive disorder, single episode, unspecified: Secondary | ICD-10-CM | POA: Diagnosis not present

## 2014-08-04 DIAGNOSIS — E669 Obesity, unspecified: Secondary | ICD-10-CM | POA: Diagnosis not present

## 2014-08-04 DIAGNOSIS — E119 Type 2 diabetes mellitus without complications: Secondary | ICD-10-CM | POA: Diagnosis not present

## 2014-08-04 DIAGNOSIS — N183 Chronic kidney disease, stage 3 (moderate): Secondary | ICD-10-CM | POA: Diagnosis not present

## 2014-08-04 DIAGNOSIS — I5023 Acute on chronic systolic (congestive) heart failure: Secondary | ICD-10-CM | POA: Diagnosis not present

## 2014-08-04 DIAGNOSIS — Z950 Presence of cardiac pacemaker: Secondary | ICD-10-CM | POA: Diagnosis not present

## 2014-08-04 DIAGNOSIS — I10 Essential (primary) hypertension: Secondary | ICD-10-CM | POA: Diagnosis not present

## 2014-08-04 DIAGNOSIS — Z9981 Dependence on supplemental oxygen: Secondary | ICD-10-CM | POA: Diagnosis not present

## 2014-08-04 DIAGNOSIS — F329 Major depressive disorder, single episode, unspecified: Secondary | ICD-10-CM | POA: Diagnosis not present

## 2014-08-04 DIAGNOSIS — I129 Hypertensive chronic kidney disease with stage 1 through stage 4 chronic kidney disease, or unspecified chronic kidney disease: Secondary | ICD-10-CM | POA: Diagnosis not present

## 2014-08-04 DIAGNOSIS — J449 Chronic obstructive pulmonary disease, unspecified: Secondary | ICD-10-CM | POA: Diagnosis not present

## 2014-08-05 DIAGNOSIS — I10 Essential (primary) hypertension: Secondary | ICD-10-CM | POA: Diagnosis not present

## 2014-08-05 DIAGNOSIS — E669 Obesity, unspecified: Secondary | ICD-10-CM | POA: Diagnosis not present

## 2014-08-05 DIAGNOSIS — F329 Major depressive disorder, single episode, unspecified: Secondary | ICD-10-CM | POA: Diagnosis not present

## 2014-08-06 DIAGNOSIS — I10 Essential (primary) hypertension: Secondary | ICD-10-CM | POA: Diagnosis not present

## 2014-08-06 DIAGNOSIS — E669 Obesity, unspecified: Secondary | ICD-10-CM | POA: Diagnosis not present

## 2014-08-06 DIAGNOSIS — F329 Major depressive disorder, single episode, unspecified: Secondary | ICD-10-CM | POA: Diagnosis not present

## 2014-08-07 DIAGNOSIS — I10 Essential (primary) hypertension: Secondary | ICD-10-CM | POA: Diagnosis not present

## 2014-08-07 DIAGNOSIS — N183 Chronic kidney disease, stage 3 (moderate): Secondary | ICD-10-CM | POA: Diagnosis not present

## 2014-08-07 DIAGNOSIS — I129 Hypertensive chronic kidney disease with stage 1 through stage 4 chronic kidney disease, or unspecified chronic kidney disease: Secondary | ICD-10-CM | POA: Diagnosis not present

## 2014-08-07 DIAGNOSIS — I5023 Acute on chronic systolic (congestive) heart failure: Secondary | ICD-10-CM | POA: Diagnosis not present

## 2014-08-07 DIAGNOSIS — Z9981 Dependence on supplemental oxygen: Secondary | ICD-10-CM | POA: Diagnosis not present

## 2014-08-07 DIAGNOSIS — J449 Chronic obstructive pulmonary disease, unspecified: Secondary | ICD-10-CM | POA: Diagnosis not present

## 2014-08-07 DIAGNOSIS — F329 Major depressive disorder, single episode, unspecified: Secondary | ICD-10-CM | POA: Diagnosis not present

## 2014-08-07 DIAGNOSIS — E119 Type 2 diabetes mellitus without complications: Secondary | ICD-10-CM | POA: Diagnosis not present

## 2014-08-07 DIAGNOSIS — Z950 Presence of cardiac pacemaker: Secondary | ICD-10-CM | POA: Diagnosis not present

## 2014-08-07 DIAGNOSIS — E669 Obesity, unspecified: Secondary | ICD-10-CM | POA: Diagnosis not present

## 2014-08-08 DIAGNOSIS — F329 Major depressive disorder, single episode, unspecified: Secondary | ICD-10-CM | POA: Diagnosis not present

## 2014-08-08 DIAGNOSIS — I10 Essential (primary) hypertension: Secondary | ICD-10-CM | POA: Diagnosis not present

## 2014-08-08 DIAGNOSIS — E669 Obesity, unspecified: Secondary | ICD-10-CM | POA: Diagnosis not present

## 2014-08-09 ENCOUNTER — Encounter: Payer: Self-pay | Admitting: Family Medicine

## 2014-08-09 ENCOUNTER — Ambulatory Visit (INDEPENDENT_AMBULATORY_CARE_PROVIDER_SITE_OTHER): Payer: 59 | Admitting: Family Medicine

## 2014-08-09 VITALS — BP 130/72 | HR 64 | Temp 97.7°F | Resp 20 | Ht 61.5 in | Wt 208.0 lb

## 2014-08-09 DIAGNOSIS — I129 Hypertensive chronic kidney disease with stage 1 through stage 4 chronic kidney disease, or unspecified chronic kidney disease: Secondary | ICD-10-CM | POA: Diagnosis not present

## 2014-08-09 DIAGNOSIS — I10 Essential (primary) hypertension: Secondary | ICD-10-CM | POA: Diagnosis not present

## 2014-08-09 DIAGNOSIS — J449 Chronic obstructive pulmonary disease, unspecified: Secondary | ICD-10-CM | POA: Diagnosis not present

## 2014-08-09 DIAGNOSIS — Z9981 Dependence on supplemental oxygen: Secondary | ICD-10-CM | POA: Diagnosis not present

## 2014-08-09 DIAGNOSIS — Z09 Encounter for follow-up examination after completed treatment for conditions other than malignant neoplasm: Secondary | ICD-10-CM | POA: Diagnosis not present

## 2014-08-09 DIAGNOSIS — I5020 Unspecified systolic (congestive) heart failure: Secondary | ICD-10-CM | POA: Diagnosis not present

## 2014-08-09 DIAGNOSIS — F329 Major depressive disorder, single episode, unspecified: Secondary | ICD-10-CM | POA: Diagnosis not present

## 2014-08-09 DIAGNOSIS — E669 Obesity, unspecified: Secondary | ICD-10-CM | POA: Diagnosis not present

## 2014-08-09 DIAGNOSIS — N183 Chronic kidney disease, stage 3 (moderate): Secondary | ICD-10-CM | POA: Diagnosis not present

## 2014-08-09 DIAGNOSIS — E119 Type 2 diabetes mellitus without complications: Secondary | ICD-10-CM | POA: Diagnosis not present

## 2014-08-09 DIAGNOSIS — Z950 Presence of cardiac pacemaker: Secondary | ICD-10-CM | POA: Diagnosis not present

## 2014-08-09 DIAGNOSIS — I5023 Acute on chronic systolic (congestive) heart failure: Secondary | ICD-10-CM | POA: Diagnosis not present

## 2014-08-09 LAB — COMPLETE METABOLIC PANEL WITH GFR
ALT: <8 U/L (ref 0–35)
AST: 11 U/L (ref 0–37)
Albumin: 3.4 g/dL — ABNORMAL LOW (ref 3.5–5.2)
Alkaline Phosphatase: 103 U/L (ref 39–117)
BUN: 50 mg/dL — ABNORMAL HIGH (ref 6–23)
CO2: 28 meq/L (ref 19–32)
Calcium: 8.9 mg/dL (ref 8.4–10.5)
Chloride: 101 mEq/L (ref 96–112)
Creat: 1.86 mg/dL — ABNORMAL HIGH (ref 0.50–1.10)
GFR, EST AFRICAN AMERICAN: 27 mL/min — AB
GFR, Est Non African American: 24 mL/min — ABNORMAL LOW
Glucose, Bld: 71 mg/dL (ref 70–99)
Potassium: 5.2 mEq/L (ref 3.5–5.3)
SODIUM: 139 meq/L (ref 135–145)
Total Bilirubin: 0.5 mg/dL (ref 0.2–1.2)
Total Protein: 6.9 g/dL (ref 6.0–8.3)

## 2014-08-09 LAB — CBC WITH DIFFERENTIAL/PLATELET
Basophils Absolute: 0 10*3/uL (ref 0.0–0.1)
Basophils Relative: 0 % (ref 0–1)
EOS ABS: 0.3 10*3/uL (ref 0.0–0.7)
Eosinophils Relative: 5 % (ref 0–5)
HCT: 31.4 % — ABNORMAL LOW (ref 36.0–46.0)
Hemoglobin: 9.9 g/dL — ABNORMAL LOW (ref 12.0–15.0)
Lymphocytes Relative: 28 % (ref 12–46)
Lymphs Abs: 1.6 10*3/uL (ref 0.7–4.0)
MCH: 29.7 pg (ref 26.0–34.0)
MCHC: 31.5 g/dL (ref 30.0–36.0)
MCV: 94.3 fL (ref 78.0–100.0)
MONOS PCT: 11 % (ref 3–12)
MPV: 10.5 fL (ref 8.6–12.4)
Monocytes Absolute: 0.6 10*3/uL (ref 0.1–1.0)
NEUTROS ABS: 3.1 10*3/uL (ref 1.7–7.7)
Neutrophils Relative %: 56 % (ref 43–77)
PLATELETS: 215 10*3/uL (ref 150–400)
RBC: 3.33 MIL/uL — ABNORMAL LOW (ref 3.87–5.11)
RDW: 14.3 % (ref 11.5–15.5)
WBC: 5.6 10*3/uL (ref 4.0–10.5)

## 2014-08-09 MED ORDER — ALBUTEROL SULFATE (2.5 MG/3ML) 0.083% IN NEBU: 2.5000 mg | INHALATION_SOLUTION | Freq: Four times a day (QID) | RESPIRATORY_TRACT | Status: DC | PRN

## 2014-08-09 NOTE — Progress Notes (Signed)
Subjective:    Patient ID: [name], female    DOB: 05-30-1922, 81 y.o.   MRN: [id number]  HPI Patient was recently hospitalized for CHF.  I have copied the relevant portions of her discharge summary and included them below for my reference:  Admit date: 07/23/2014 Discharge date: 07/27/2014  Time spent: 40 minutes  Recommendations for Outpatient Follow-up:  1. Follow-up with primary care physician in one week. 2. Check BMP in 7 days, follow up on creatinine. Creatinine on discharge is 2.3. 3. Hold valsartan, consider to restart after kidney function is stabilized.  Discharge Diagnoses:  Principal Problem:  SOB (shortness of breath) Active Problems:  Hypertension  Obesity  Depression  Tachy-brady syndrome  Pacemaker-[employer].Jude  Gout  Arthritis  GERD (gastroesophageal reflux disease)  Chronic systolic CHF (congestive heart failure)  CKD (chronic kidney disease), stage III  COPD (chronic obstructive pulmonary disease)  Acute on chronic systolic heart failure Filed Weights   07/25/14 0500 07/26/14 0500 07/27/14 0500  Weight: 94.7 kg (208 lb 12.4 oz) 94.303 kg (207 lb 14.4 oz) 95.981 kg (211 lb 9.6 oz)   History of present illness:  [name] is a 81 y.o. female with PMH of hypertension, GERD, COPD, gout, chronic kidney disease-stage III, congestive heart failure with EF 25-30%, depression, tachycardia-bradycardia syndrome, pacemaker placement, who presents with shortness distress.  Patient SOB started 2 hours prior to arrival. She reports her entire body was got hot and she woke up with shortness of breath. She has mild cough without sputum production. She denies any chest pain. Per EMS, patient's oxygen saturation was 82% on room air, which improve to above 90% when placed on nonrebreather. She was given 2 nitroglycerin tablets which improved her difficult breathing. She states she has been compliant with her Lasix medication. When I evaluated pt  in ED, her SOB has resolved. Patient denies fever, chills, fatigue, headaches, cough, chest pain, SOB, abdominal pain, diarrhea, constipation, dysuria, urgency, frequency, hematuria, skin rashes, joint pain or leg swelling. No unilateral weakness, numbness or tingling sensations. No vision change or hearing loss.  In ED, patient was found to have troponin 0.08, normal temperature, stable renal function. EKG showed old left bundle blockage and QTC 490. BNP=1275. Chest x-ray showed mild pulmonary congestion. Patient is admitted to inpatient for further evaluation and treatment.  Acute hypoxic respiratory failure secondary to acute on chronic combined heart failure due to ischemic cardiomyopathy: BNP 1275, vascular congestions on CXR. 2-D echo 10/28/10 showed EF 25-30% with grade 1 diastolic dysfunction, but EF significantly improve on ECHO during this admission. On Lasix 40 mg daily at home. Feeling better.  - Tele: NSR, ECG: Stable Q-waves in inferior leads  -2d echo: Ejection fraction 45-50% with basal to mid inferior hypokinesis, grade 1 diastolic dysfunction, LVEF improved significantly from June 2015 which was 25-30%. - Troponins: Repeatedly 0.08, stable and likely elevated from CKD - Home heart failure medications including aspirin, Coreg, Imdur and Lasix continued. - On discharge her valsartan discontinued because of worsening renal function. Elevated troponin -Slightly elevated troponin at 0.09, denies any chest pain. -This is likely secondary to concurrent acute systolic CHF and CKD. COPD: stable. No signs of acute exacerbation -Albuterol nebulizer when necessary CKD-III:  -Baseline creatinine 1.4-1.7. Creatinine trending up slightly but still in her normal range. -Creatinine increased to 2.3 on the day of discharge, Diovan discontinued. -Patient discharged on 40 mg of Lasix twice a day, check BMP in 1 week. Tachybradycardia syndrome: Heart rate is controlled. -  On  pacemaker -Continue digoxin Mitral valve regurgitation, moderate with tethered valve leaflet - Per cardiology Gout: Stable. -Continue allopurinol Hypertension: Blood pressures gradually trending down - Continue beta blockers and Lasix Imdur. Normocytic anemia, progressive since admission despite diuresis, no obvious bleeding - Iron studies, b12, folate, TSH, occult stool      She is here today for follow up.   Her date of discharge from the hospital was 211 pounds. Here today she is 208 pounds. There is no swelling in her legs. She has no evidence of JVD. On pulmonary examination she does have some faint left basilar crackles and some faint right basilar crackles. She denies any shortness of breath. She denies any orthopnea. She denies any paroxysmal nocturnal dyspnea. She is taking both Lasix tablets in the morning. She is not taking them twice a day. She denies any chest pain here and she denies any palpitations. She denies any syncope. She denies any dizziness upon standing. Past Medical History  Diagnosis Date  . Hypertension   . Obesity   . Depression   . Tachy-brady syndrome     a. 03/2006 s/p SJM 5356 Verity ADx XLDR DC PPM (ser#: [id number]).  . Gout   . Chronic systolic CHF (congestive heart failure)     a. 10/2013 Echo: EF 25-30%, diff HK, Gr 1 DD, triv AI, mild to mod MR, dev dil LA, mild RV dysfxn, mildly dil RA, mod TR.  . Cardiomyopathy     a. ? ischemic vs non-ischemic;  b. 09/2013 Echo: EF 25-30%.  Marland Kitchen COPD (chronic obstructive pulmonary disease)   . Glaucoma of both eyes   . GERD (gastroesophageal reflux disease)   . Presence of permanent cardiac pacemaker   . CKD (chronic kidney disease), stage III   . Kidney stones   . Arthritis     "knees" (07/23/2014)   Past Surgical History  Procedure Laterality Date  . Cardiac catheterization  10/2000    Archie Endo 10/10/2010  . Insert / replace / remove pacemaker  08/2000; 03/2006    Archie Endo 10/07/2010  . Cholecystectomy open    .  Kidney stone surgery      "hospitalized for 19 days"  . Tubal ligation     Current Outpatient Prescriptions on File Prior to Visit  Medication Sig Dispense Refill  . acetaminophen (TYLENOL) 650 MG CR tablet Take 650 mg by mouth every 8 (eight) hours as needed for pain.    Marland Kitchen allopurinol (ZYLOPRIM) 100 MG tablet TAKE 2 TABLETS BY MOUTH ONCE DAILY 60 tablet 3  . aspirin 81 MG tablet Take 1 tablet (81 mg total) by mouth daily. 30 tablet 11  . calcium carbonate (TUMS - DOSED IN MG ELEMENTAL CALCIUM) 500 MG chewable tablet Chew 1 tablet by mouth daily.    . carvedilol (COREG) 25 MG tablet TAKE 1 TABLET BY MOUTH TWICE DAILY WITH A MEAL 60 tablet 10  . citalopram (CELEXA) 10 MG tablet TAKE 1 TABLET BY MOUTH DAILY 30 tablet 5  . DIGOX 125 MCG tablet TAKE ONE TABLET BY MOUTH DAILY 30 tablet 3  . FERROUS SULFATE PO Take 65 mg by mouth daily.    . furosemide (LASIX) 40 MG tablet Take 1 tablet (40 mg total) by mouth 2 (two) times daily. 60 tablet 0  . omeprazole (PRILOSEC) 40 MG capsule TAKE 1 CAPSULE BY MOUTH DAILY 30 capsule 11   No current facility-administered medications on file prior to visit.   No Known Allergies  History   Social History  .  Marital Status: Widowed    Spouse Name: N/A  . Number of Children: N/A  . Years of Education: N/A   Occupational History  . Not on file.   Social History Main Topics  . Smoking status: Former Smoker    Types: Cigarettes  . Smokeless tobacco: Never Used     Comment: "never inhaled cigarettes; just lit them"  . Alcohol Use: Yes     Comment: 07/23/2014 "I used to drink beer; nothing in 3-4 years"  . Drug Use: No  . Sexual Activity: No   Other Topics Concern  . Not on file   Social History Narrative      Review of Systems  All other systems reviewed and are negative.      Objective:   Physical Exam  Constitutional: She is oriented to person, place, and time. She appears well-developed and well-nourished.  HENT:  Mouth/Throat:  Oropharynx is clear and moist.  Neck: Neck supple. No JVD present.  Cardiovascular: Normal rate, regular rhythm and normal heart sounds.   Pulmonary/Chest: Effort normal. She has rales.  Abdominal: Soft. Bowel sounds are normal. She exhibits no distension. There is no tenderness. There is no rebound and no guarding.  Musculoskeletal: She exhibits no edema.  Lymphadenopathy:    She has no cervical adenopathy.  Neurological: She is alert and oriented to person, place, and time.  Vitals reviewed.         Assessment & Plan:  Hospital discharge follow-up - Plan: CBC with Differential/Platelet, COMPLETE METABOLIC PANEL WITH GFR, Brain natriuretic peptide  Systolic congestive heart failure, unspecified congestive heart failure chronicity - Plan: CBC with Differential/Platelet, COMPLETE METABOLIC PANEL WITH GFR, Brain natriuretic peptide   Patient does have some faint basilar crackles but there is no evidence of fluid overload on exam today. I hesitate to increase her diuretic given her declining renal function. I will check a BNP and a BMP. If her creatinine is stable and her BNP is elevated we may need to increase her Lasix dose slightly. However at the present time she is asymptomatic. I will check her kidney function, if her kidney function has improved I would like to start valsartan back given her congestive heart failure.

## 2014-08-10 ENCOUNTER — Other Ambulatory Visit: Payer: Self-pay | Admitting: Family Medicine

## 2014-08-10 DIAGNOSIS — E669 Obesity, unspecified: Secondary | ICD-10-CM | POA: Diagnosis not present

## 2014-08-10 DIAGNOSIS — I10 Essential (primary) hypertension: Secondary | ICD-10-CM | POA: Diagnosis not present

## 2014-08-10 DIAGNOSIS — F329 Major depressive disorder, single episode, unspecified: Secondary | ICD-10-CM | POA: Diagnosis not present

## 2014-08-10 LAB — BRAIN NATRIURETIC PEPTIDE: BRAIN NATRIURETIC PEPTIDE: 249.5 pg/mL — AB (ref 0.0–100.0)

## 2014-08-11 DIAGNOSIS — I10 Essential (primary) hypertension: Secondary | ICD-10-CM | POA: Diagnosis not present

## 2014-08-11 DIAGNOSIS — E669 Obesity, unspecified: Secondary | ICD-10-CM | POA: Diagnosis not present

## 2014-08-11 DIAGNOSIS — F329 Major depressive disorder, single episode, unspecified: Secondary | ICD-10-CM | POA: Diagnosis not present

## 2014-08-12 DIAGNOSIS — I10 Essential (primary) hypertension: Secondary | ICD-10-CM | POA: Diagnosis not present

## 2014-08-12 DIAGNOSIS — F329 Major depressive disorder, single episode, unspecified: Secondary | ICD-10-CM | POA: Diagnosis not present

## 2014-08-12 DIAGNOSIS — E669 Obesity, unspecified: Secondary | ICD-10-CM | POA: Diagnosis not present

## 2014-08-13 ENCOUNTER — Encounter: Payer: Self-pay | Admitting: Family Medicine

## 2014-08-13 DIAGNOSIS — E669 Obesity, unspecified: Secondary | ICD-10-CM | POA: Diagnosis not present

## 2014-08-13 DIAGNOSIS — E119 Type 2 diabetes mellitus without complications: Secondary | ICD-10-CM | POA: Diagnosis not present

## 2014-08-13 DIAGNOSIS — Z9981 Dependence on supplemental oxygen: Secondary | ICD-10-CM | POA: Diagnosis not present

## 2014-08-13 DIAGNOSIS — Z950 Presence of cardiac pacemaker: Secondary | ICD-10-CM | POA: Diagnosis not present

## 2014-08-13 DIAGNOSIS — I10 Essential (primary) hypertension: Secondary | ICD-10-CM | POA: Diagnosis not present

## 2014-08-13 DIAGNOSIS — I129 Hypertensive chronic kidney disease with stage 1 through stage 4 chronic kidney disease, or unspecified chronic kidney disease: Secondary | ICD-10-CM | POA: Diagnosis not present

## 2014-08-13 DIAGNOSIS — F329 Major depressive disorder, single episode, unspecified: Secondary | ICD-10-CM | POA: Diagnosis not present

## 2014-08-13 DIAGNOSIS — I5023 Acute on chronic systolic (congestive) heart failure: Secondary | ICD-10-CM | POA: Diagnosis not present

## 2014-08-13 DIAGNOSIS — N183 Chronic kidney disease, stage 3 (moderate): Secondary | ICD-10-CM | POA: Diagnosis not present

## 2014-08-13 DIAGNOSIS — J449 Chronic obstructive pulmonary disease, unspecified: Secondary | ICD-10-CM | POA: Diagnosis not present

## 2014-08-14 DIAGNOSIS — R609 Edema, unspecified: Secondary | ICD-10-CM | POA: Diagnosis not present

## 2014-08-14 DIAGNOSIS — M818 Other osteoporosis without current pathological fracture: Secondary | ICD-10-CM | POA: Diagnosis not present

## 2014-08-14 DIAGNOSIS — I5022 Chronic systolic (congestive) heart failure: Secondary | ICD-10-CM | POA: Diagnosis not present

## 2014-08-14 DIAGNOSIS — E669 Obesity, unspecified: Secondary | ICD-10-CM | POA: Diagnosis not present

## 2014-08-14 DIAGNOSIS — F329 Major depressive disorder, single episode, unspecified: Secondary | ICD-10-CM | POA: Diagnosis not present

## 2014-08-14 DIAGNOSIS — I10 Essential (primary) hypertension: Secondary | ICD-10-CM | POA: Diagnosis not present

## 2014-08-14 DIAGNOSIS — M6281 Muscle weakness (generalized): Secondary | ICD-10-CM | POA: Diagnosis not present

## 2014-08-14 DIAGNOSIS — J449 Chronic obstructive pulmonary disease, unspecified: Secondary | ICD-10-CM | POA: Diagnosis not present

## 2014-08-15 ENCOUNTER — Telehealth: Payer: Self-pay | Admitting: *Deleted

## 2014-08-15 DIAGNOSIS — I10 Essential (primary) hypertension: Secondary | ICD-10-CM | POA: Diagnosis not present

## 2014-08-15 DIAGNOSIS — F329 Major depressive disorder, single episode, unspecified: Secondary | ICD-10-CM | POA: Diagnosis not present

## 2014-08-15 DIAGNOSIS — I129 Hypertensive chronic kidney disease with stage 1 through stage 4 chronic kidney disease, or unspecified chronic kidney disease: Secondary | ICD-10-CM | POA: Diagnosis not present

## 2014-08-15 DIAGNOSIS — N183 Chronic kidney disease, stage 3 (moderate): Secondary | ICD-10-CM | POA: Diagnosis not present

## 2014-08-15 DIAGNOSIS — E119 Type 2 diabetes mellitus without complications: Secondary | ICD-10-CM | POA: Diagnosis not present

## 2014-08-15 DIAGNOSIS — E669 Obesity, unspecified: Secondary | ICD-10-CM | POA: Diagnosis not present

## 2014-08-15 DIAGNOSIS — J449 Chronic obstructive pulmonary disease, unspecified: Secondary | ICD-10-CM | POA: Diagnosis not present

## 2014-08-15 DIAGNOSIS — I5023 Acute on chronic systolic (congestive) heart failure: Secondary | ICD-10-CM | POA: Diagnosis not present

## 2014-08-15 DIAGNOSIS — Z9981 Dependence on supplemental oxygen: Secondary | ICD-10-CM | POA: Diagnosis not present

## 2014-08-15 DIAGNOSIS — Z950 Presence of cardiac pacemaker: Secondary | ICD-10-CM | POA: Diagnosis not present

## 2014-08-15 NOTE — Telephone Encounter (Signed)
[name] from [facility] called wanting verbal order to extend her PT beginning March 27 2 times a week for 2 weeks, I gave the go ahead with the orders for PT.   IF you have any questions please call NICK

## 2014-08-16 ENCOUNTER — Ambulatory Visit (INDEPENDENT_AMBULATORY_CARE_PROVIDER_SITE_OTHER): Payer: Medicare Other | Admitting: *Deleted

## 2014-08-16 DIAGNOSIS — I129 Hypertensive chronic kidney disease with stage 1 through stage 4 chronic kidney disease, or unspecified chronic kidney disease: Secondary | ICD-10-CM | POA: Diagnosis not present

## 2014-08-16 DIAGNOSIS — I10 Essential (primary) hypertension: Secondary | ICD-10-CM | POA: Diagnosis not present

## 2014-08-16 DIAGNOSIS — N183 Chronic kidney disease, stage 3 (moderate): Secondary | ICD-10-CM | POA: Diagnosis not present

## 2014-08-16 DIAGNOSIS — F329 Major depressive disorder, single episode, unspecified: Secondary | ICD-10-CM | POA: Diagnosis not present

## 2014-08-16 DIAGNOSIS — I5023 Acute on chronic systolic (congestive) heart failure: Secondary | ICD-10-CM | POA: Diagnosis not present

## 2014-08-16 DIAGNOSIS — J449 Chronic obstructive pulmonary disease, unspecified: Secondary | ICD-10-CM | POA: Diagnosis not present

## 2014-08-16 DIAGNOSIS — I495 Sick sinus syndrome: Secondary | ICD-10-CM

## 2014-08-16 DIAGNOSIS — E669 Obesity, unspecified: Secondary | ICD-10-CM | POA: Diagnosis not present

## 2014-08-16 LAB — MDC_IDC_ENUM_SESS_TYPE_INCLINIC
Battery Impedance: 2200 Ohm
Battery Voltage: 2.75 V
Date Time Interrogation Session: 20160324143739
Implantable Pulse Generator Model: 5356
Implantable Pulse Generator Serial Number: 1124188
Lead Channel Impedance Value: 219 Ohm
Lead Channel Impedance Value: 304 Ohm
Lead Channel Pacing Threshold Amplitude: 0.75 V
Lead Channel Pacing Threshold Amplitude: 1.125 V
Lead Channel Pacing Threshold Pulse Width: 0.4 ms
Lead Channel Pacing Threshold Pulse Width: 0.6 ms
Lead Channel Sensing Intrinsic Amplitude: 1.3 mV
Lead Channel Sensing Intrinsic Amplitude: 6.1 mV
Lead Channel Setting Pacing Amplitude: 2 V
Lead Channel Setting Pacing Pulse Width: 0.4 ms
Lead Channel Setting Sensing Sensitivity: 1.5 mV
MDC IDC STAT BRADY RA PERCENT PACED: 13 %
MDC IDC STAT BRADY RV PERCENT PACED: 4.8 %

## 2014-08-16 NOTE — Progress Notes (Signed)
Pacemaker check in clinic. Normal device function. Thresholds, sensing, impedances consistent with previous measurements. Device programmed to maximize longevity. 2635 mode switches--longest between 1 to 3 minutes. No high ventricular rates noted. Device programmed at appropriate safety margins. Histogram distribution appropriate for patient activity level. Device programmed to optimize intrinsic conduction. Estimated longevity 2.75 to 5.75 years. ROV in September with SK.

## 2014-08-16 NOTE — Telephone Encounter (Signed)
ok 

## 2014-08-18 DIAGNOSIS — I10 Essential (primary) hypertension: Secondary | ICD-10-CM | POA: Diagnosis not present

## 2014-08-18 DIAGNOSIS — F329 Major depressive disorder, single episode, unspecified: Secondary | ICD-10-CM | POA: Diagnosis not present

## 2014-08-18 DIAGNOSIS — E669 Obesity, unspecified: Secondary | ICD-10-CM | POA: Diagnosis not present

## 2014-08-19 DIAGNOSIS — F329 Major depressive disorder, single episode, unspecified: Secondary | ICD-10-CM | POA: Diagnosis not present

## 2014-08-19 DIAGNOSIS — E669 Obesity, unspecified: Secondary | ICD-10-CM | POA: Diagnosis not present

## 2014-08-19 DIAGNOSIS — I10 Essential (primary) hypertension: Secondary | ICD-10-CM | POA: Diagnosis not present

## 2014-08-20 DIAGNOSIS — I10 Essential (primary) hypertension: Secondary | ICD-10-CM | POA: Diagnosis not present

## 2014-08-20 DIAGNOSIS — F329 Major depressive disorder, single episode, unspecified: Secondary | ICD-10-CM | POA: Diagnosis not present

## 2014-08-20 DIAGNOSIS — E669 Obesity, unspecified: Secondary | ICD-10-CM | POA: Diagnosis not present

## 2014-08-21 DIAGNOSIS — E669 Obesity, unspecified: Secondary | ICD-10-CM | POA: Diagnosis not present

## 2014-08-21 DIAGNOSIS — F329 Major depressive disorder, single episode, unspecified: Secondary | ICD-10-CM | POA: Diagnosis not present

## 2014-08-21 DIAGNOSIS — I10 Essential (primary) hypertension: Secondary | ICD-10-CM | POA: Diagnosis not present

## 2014-08-22 DIAGNOSIS — F329 Major depressive disorder, single episode, unspecified: Secondary | ICD-10-CM | POA: Diagnosis not present

## 2014-08-22 DIAGNOSIS — I10 Essential (primary) hypertension: Secondary | ICD-10-CM | POA: Diagnosis not present

## 2014-08-22 DIAGNOSIS — E669 Obesity, unspecified: Secondary | ICD-10-CM | POA: Diagnosis not present

## 2014-08-23 DIAGNOSIS — F329 Major depressive disorder, single episode, unspecified: Secondary | ICD-10-CM | POA: Diagnosis not present

## 2014-08-23 DIAGNOSIS — E669 Obesity, unspecified: Secondary | ICD-10-CM | POA: Diagnosis not present

## 2014-08-23 DIAGNOSIS — Z9981 Dependence on supplemental oxygen: Secondary | ICD-10-CM | POA: Diagnosis not present

## 2014-08-23 DIAGNOSIS — I10 Essential (primary) hypertension: Secondary | ICD-10-CM | POA: Diagnosis not present

## 2014-08-23 DIAGNOSIS — E119 Type 2 diabetes mellitus without complications: Secondary | ICD-10-CM | POA: Diagnosis not present

## 2014-08-23 DIAGNOSIS — I129 Hypertensive chronic kidney disease with stage 1 through stage 4 chronic kidney disease, or unspecified chronic kidney disease: Secondary | ICD-10-CM | POA: Diagnosis not present

## 2014-08-23 DIAGNOSIS — I5023 Acute on chronic systolic (congestive) heart failure: Secondary | ICD-10-CM | POA: Diagnosis not present

## 2014-08-23 DIAGNOSIS — N183 Chronic kidney disease, stage 3 (moderate): Secondary | ICD-10-CM | POA: Diagnosis not present

## 2014-08-23 DIAGNOSIS — J449 Chronic obstructive pulmonary disease, unspecified: Secondary | ICD-10-CM | POA: Diagnosis not present

## 2014-08-23 DIAGNOSIS — Z950 Presence of cardiac pacemaker: Secondary | ICD-10-CM | POA: Diagnosis not present

## 2014-08-24 DIAGNOSIS — Z9981 Dependence on supplemental oxygen: Secondary | ICD-10-CM | POA: Diagnosis not present

## 2014-08-24 DIAGNOSIS — F329 Major depressive disorder, single episode, unspecified: Secondary | ICD-10-CM | POA: Diagnosis not present

## 2014-08-24 DIAGNOSIS — I5023 Acute on chronic systolic (congestive) heart failure: Secondary | ICD-10-CM | POA: Diagnosis not present

## 2014-08-24 DIAGNOSIS — J449 Chronic obstructive pulmonary disease, unspecified: Secondary | ICD-10-CM | POA: Diagnosis not present

## 2014-08-24 DIAGNOSIS — I10 Essential (primary) hypertension: Secondary | ICD-10-CM | POA: Diagnosis not present

## 2014-08-24 DIAGNOSIS — I129 Hypertensive chronic kidney disease with stage 1 through stage 4 chronic kidney disease, or unspecified chronic kidney disease: Secondary | ICD-10-CM | POA: Diagnosis not present

## 2014-08-24 DIAGNOSIS — N183 Chronic kidney disease, stage 3 (moderate): Secondary | ICD-10-CM | POA: Diagnosis not present

## 2014-08-24 DIAGNOSIS — Z950 Presence of cardiac pacemaker: Secondary | ICD-10-CM | POA: Diagnosis not present

## 2014-08-24 DIAGNOSIS — E669 Obesity, unspecified: Secondary | ICD-10-CM | POA: Diagnosis not present

## 2014-08-24 DIAGNOSIS — E119 Type 2 diabetes mellitus without complications: Secondary | ICD-10-CM | POA: Diagnosis not present

## 2014-08-25 DIAGNOSIS — E669 Obesity, unspecified: Secondary | ICD-10-CM | POA: Diagnosis not present

## 2014-08-25 DIAGNOSIS — F329 Major depressive disorder, single episode, unspecified: Secondary | ICD-10-CM | POA: Diagnosis not present

## 2014-08-25 DIAGNOSIS — I10 Essential (primary) hypertension: Secondary | ICD-10-CM | POA: Diagnosis not present

## 2014-08-26 DIAGNOSIS — E669 Obesity, unspecified: Secondary | ICD-10-CM | POA: Diagnosis not present

## 2014-08-26 DIAGNOSIS — F329 Major depressive disorder, single episode, unspecified: Secondary | ICD-10-CM | POA: Diagnosis not present

## 2014-08-26 DIAGNOSIS — I10 Essential (primary) hypertension: Secondary | ICD-10-CM | POA: Diagnosis not present

## 2014-08-27 DIAGNOSIS — F329 Major depressive disorder, single episode, unspecified: Secondary | ICD-10-CM | POA: Diagnosis not present

## 2014-08-27 DIAGNOSIS — I5022 Chronic systolic (congestive) heart failure: Secondary | ICD-10-CM | POA: Diagnosis not present

## 2014-08-27 DIAGNOSIS — I10 Essential (primary) hypertension: Secondary | ICD-10-CM | POA: Diagnosis not present

## 2014-08-27 DIAGNOSIS — E669 Obesity, unspecified: Secondary | ICD-10-CM | POA: Diagnosis not present

## 2014-08-27 DIAGNOSIS — M2550 Pain in unspecified joint: Secondary | ICD-10-CM | POA: Diagnosis not present

## 2014-08-28 ENCOUNTER — Encounter: Payer: Self-pay | Admitting: Internal Medicine

## 2014-08-28 DIAGNOSIS — I10 Essential (primary) hypertension: Secondary | ICD-10-CM | POA: Diagnosis not present

## 2014-08-28 DIAGNOSIS — E669 Obesity, unspecified: Secondary | ICD-10-CM | POA: Diagnosis not present

## 2014-08-28 DIAGNOSIS — F329 Major depressive disorder, single episode, unspecified: Secondary | ICD-10-CM | POA: Diagnosis not present

## 2014-08-29 DIAGNOSIS — Z9981 Dependence on supplemental oxygen: Secondary | ICD-10-CM | POA: Diagnosis not present

## 2014-08-29 DIAGNOSIS — E669 Obesity, unspecified: Secondary | ICD-10-CM | POA: Diagnosis not present

## 2014-08-29 DIAGNOSIS — I10 Essential (primary) hypertension: Secondary | ICD-10-CM | POA: Diagnosis not present

## 2014-08-29 DIAGNOSIS — Z950 Presence of cardiac pacemaker: Secondary | ICD-10-CM | POA: Diagnosis not present

## 2014-08-29 DIAGNOSIS — I129 Hypertensive chronic kidney disease with stage 1 through stage 4 chronic kidney disease, or unspecified chronic kidney disease: Secondary | ICD-10-CM | POA: Diagnosis not present

## 2014-08-29 DIAGNOSIS — F329 Major depressive disorder, single episode, unspecified: Secondary | ICD-10-CM | POA: Diagnosis not present

## 2014-08-29 DIAGNOSIS — I5023 Acute on chronic systolic (congestive) heart failure: Secondary | ICD-10-CM | POA: Diagnosis not present

## 2014-08-29 DIAGNOSIS — J449 Chronic obstructive pulmonary disease, unspecified: Secondary | ICD-10-CM | POA: Diagnosis not present

## 2014-08-29 DIAGNOSIS — N183 Chronic kidney disease, stage 3 (moderate): Secondary | ICD-10-CM | POA: Diagnosis not present

## 2014-08-29 DIAGNOSIS — E119 Type 2 diabetes mellitus without complications: Secondary | ICD-10-CM | POA: Diagnosis not present

## 2014-08-30 DIAGNOSIS — E669 Obesity, unspecified: Secondary | ICD-10-CM | POA: Diagnosis not present

## 2014-08-30 DIAGNOSIS — F329 Major depressive disorder, single episode, unspecified: Secondary | ICD-10-CM | POA: Diagnosis not present

## 2014-08-30 DIAGNOSIS — I10 Essential (primary) hypertension: Secondary | ICD-10-CM | POA: Diagnosis not present

## 2014-08-31 DIAGNOSIS — I5023 Acute on chronic systolic (congestive) heart failure: Secondary | ICD-10-CM | POA: Diagnosis not present

## 2014-08-31 DIAGNOSIS — I10 Essential (primary) hypertension: Secondary | ICD-10-CM | POA: Diagnosis not present

## 2014-08-31 DIAGNOSIS — J449 Chronic obstructive pulmonary disease, unspecified: Secondary | ICD-10-CM | POA: Diagnosis not present

## 2014-08-31 DIAGNOSIS — I129 Hypertensive chronic kidney disease with stage 1 through stage 4 chronic kidney disease, or unspecified chronic kidney disease: Secondary | ICD-10-CM | POA: Diagnosis not present

## 2014-08-31 DIAGNOSIS — Z950 Presence of cardiac pacemaker: Secondary | ICD-10-CM | POA: Diagnosis not present

## 2014-08-31 DIAGNOSIS — Z9981 Dependence on supplemental oxygen: Secondary | ICD-10-CM | POA: Diagnosis not present

## 2014-08-31 DIAGNOSIS — E669 Obesity, unspecified: Secondary | ICD-10-CM | POA: Diagnosis not present

## 2014-08-31 DIAGNOSIS — N183 Chronic kidney disease, stage 3 (moderate): Secondary | ICD-10-CM | POA: Diagnosis not present

## 2014-08-31 DIAGNOSIS — F329 Major depressive disorder, single episode, unspecified: Secondary | ICD-10-CM | POA: Diagnosis not present

## 2014-08-31 DIAGNOSIS — E119 Type 2 diabetes mellitus without complications: Secondary | ICD-10-CM | POA: Diagnosis not present

## 2014-09-01 DIAGNOSIS — R609 Edema, unspecified: Secondary | ICD-10-CM | POA: Diagnosis not present

## 2014-09-01 DIAGNOSIS — E669 Obesity, unspecified: Secondary | ICD-10-CM | POA: Diagnosis not present

## 2014-09-01 DIAGNOSIS — J449 Chronic obstructive pulmonary disease, unspecified: Secondary | ICD-10-CM | POA: Diagnosis not present

## 2014-09-01 DIAGNOSIS — I10 Essential (primary) hypertension: Secondary | ICD-10-CM | POA: Diagnosis not present

## 2014-09-01 DIAGNOSIS — F329 Major depressive disorder, single episode, unspecified: Secondary | ICD-10-CM | POA: Diagnosis not present

## 2014-09-02 DIAGNOSIS — E669 Obesity, unspecified: Secondary | ICD-10-CM | POA: Diagnosis not present

## 2014-09-02 DIAGNOSIS — F329 Major depressive disorder, single episode, unspecified: Secondary | ICD-10-CM | POA: Diagnosis not present

## 2014-09-02 DIAGNOSIS — I10 Essential (primary) hypertension: Secondary | ICD-10-CM | POA: Diagnosis not present

## 2014-09-03 DIAGNOSIS — E669 Obesity, unspecified: Secondary | ICD-10-CM | POA: Diagnosis not present

## 2014-09-03 DIAGNOSIS — I10 Essential (primary) hypertension: Secondary | ICD-10-CM | POA: Diagnosis not present

## 2014-09-03 DIAGNOSIS — F329 Major depressive disorder, single episode, unspecified: Secondary | ICD-10-CM | POA: Diagnosis not present

## 2014-09-04 DIAGNOSIS — F329 Major depressive disorder, single episode, unspecified: Secondary | ICD-10-CM | POA: Diagnosis not present

## 2014-09-04 DIAGNOSIS — I10 Essential (primary) hypertension: Secondary | ICD-10-CM | POA: Diagnosis not present

## 2014-09-04 DIAGNOSIS — E669 Obesity, unspecified: Secondary | ICD-10-CM | POA: Diagnosis not present

## 2014-09-05 DIAGNOSIS — F329 Major depressive disorder, single episode, unspecified: Secondary | ICD-10-CM | POA: Diagnosis not present

## 2014-09-05 DIAGNOSIS — E669 Obesity, unspecified: Secondary | ICD-10-CM | POA: Diagnosis not present

## 2014-09-05 DIAGNOSIS — I10 Essential (primary) hypertension: Secondary | ICD-10-CM | POA: Diagnosis not present

## 2014-09-06 DIAGNOSIS — E669 Obesity, unspecified: Secondary | ICD-10-CM | POA: Diagnosis not present

## 2014-09-06 DIAGNOSIS — F329 Major depressive disorder, single episode, unspecified: Secondary | ICD-10-CM | POA: Diagnosis not present

## 2014-09-06 DIAGNOSIS — I10 Essential (primary) hypertension: Secondary | ICD-10-CM | POA: Diagnosis not present

## 2014-09-07 ENCOUNTER — Other Ambulatory Visit: Payer: Self-pay | Admitting: Family Medicine

## 2014-09-07 DIAGNOSIS — F329 Major depressive disorder, single episode, unspecified: Secondary | ICD-10-CM | POA: Diagnosis not present

## 2014-09-07 DIAGNOSIS — E669 Obesity, unspecified: Secondary | ICD-10-CM | POA: Diagnosis not present

## 2014-09-07 DIAGNOSIS — I10 Essential (primary) hypertension: Secondary | ICD-10-CM | POA: Diagnosis not present

## 2014-09-08 DIAGNOSIS — F329 Major depressive disorder, single episode, unspecified: Secondary | ICD-10-CM | POA: Diagnosis not present

## 2014-09-08 DIAGNOSIS — I10 Essential (primary) hypertension: Secondary | ICD-10-CM | POA: Diagnosis not present

## 2014-09-08 DIAGNOSIS — E669 Obesity, unspecified: Secondary | ICD-10-CM | POA: Diagnosis not present

## 2014-09-09 DIAGNOSIS — E669 Obesity, unspecified: Secondary | ICD-10-CM | POA: Diagnosis not present

## 2014-09-09 DIAGNOSIS — F329 Major depressive disorder, single episode, unspecified: Secondary | ICD-10-CM | POA: Diagnosis not present

## 2014-09-09 DIAGNOSIS — I10 Essential (primary) hypertension: Secondary | ICD-10-CM | POA: Diagnosis not present

## 2014-09-10 DIAGNOSIS — F329 Major depressive disorder, single episode, unspecified: Secondary | ICD-10-CM | POA: Diagnosis not present

## 2014-09-10 DIAGNOSIS — I10 Essential (primary) hypertension: Secondary | ICD-10-CM | POA: Diagnosis not present

## 2014-09-10 DIAGNOSIS — E669 Obesity, unspecified: Secondary | ICD-10-CM | POA: Diagnosis not present

## 2014-09-10 NOTE — Telephone Encounter (Signed)
Medication refilled per protocol. 

## 2014-09-11 ENCOUNTER — Other Ambulatory Visit: Payer: Self-pay | Admitting: Family Medicine

## 2014-09-11 DIAGNOSIS — F329 Major depressive disorder, single episode, unspecified: Secondary | ICD-10-CM | POA: Diagnosis not present

## 2014-09-11 DIAGNOSIS — I10 Essential (primary) hypertension: Secondary | ICD-10-CM | POA: Diagnosis not present

## 2014-09-11 DIAGNOSIS — E669 Obesity, unspecified: Secondary | ICD-10-CM | POA: Diagnosis not present

## 2014-09-11 MED ORDER — FUROSEMIDE 40 MG PO TABS: 40.0000 mg | ORAL_TABLET | Freq: Two times a day (BID) | ORAL | Status: DC

## 2014-09-12 DIAGNOSIS — E669 Obesity, unspecified: Secondary | ICD-10-CM | POA: Diagnosis not present

## 2014-09-12 DIAGNOSIS — I10 Essential (primary) hypertension: Secondary | ICD-10-CM | POA: Diagnosis not present

## 2014-09-12 DIAGNOSIS — F329 Major depressive disorder, single episode, unspecified: Secondary | ICD-10-CM | POA: Diagnosis not present

## 2014-09-13 DIAGNOSIS — E669 Obesity, unspecified: Secondary | ICD-10-CM | POA: Diagnosis not present

## 2014-09-13 DIAGNOSIS — I10 Essential (primary) hypertension: Secondary | ICD-10-CM | POA: Diagnosis not present

## 2014-09-13 DIAGNOSIS — F329 Major depressive disorder, single episode, unspecified: Secondary | ICD-10-CM | POA: Diagnosis not present

## 2014-09-14 DIAGNOSIS — I10 Essential (primary) hypertension: Secondary | ICD-10-CM | POA: Diagnosis not present

## 2014-09-14 DIAGNOSIS — E669 Obesity, unspecified: Secondary | ICD-10-CM | POA: Diagnosis not present

## 2014-09-14 DIAGNOSIS — F329 Major depressive disorder, single episode, unspecified: Secondary | ICD-10-CM | POA: Diagnosis not present

## 2014-09-15 DIAGNOSIS — E669 Obesity, unspecified: Secondary | ICD-10-CM | POA: Diagnosis not present

## 2014-09-15 DIAGNOSIS — F329 Major depressive disorder, single episode, unspecified: Secondary | ICD-10-CM | POA: Diagnosis not present

## 2014-09-15 DIAGNOSIS — I10 Essential (primary) hypertension: Secondary | ICD-10-CM | POA: Diagnosis not present

## 2014-09-16 DIAGNOSIS — I10 Essential (primary) hypertension: Secondary | ICD-10-CM | POA: Diagnosis not present

## 2014-09-16 DIAGNOSIS — F329 Major depressive disorder, single episode, unspecified: Secondary | ICD-10-CM | POA: Diagnosis not present

## 2014-09-16 DIAGNOSIS — E669 Obesity, unspecified: Secondary | ICD-10-CM | POA: Diagnosis not present

## 2014-09-24 DIAGNOSIS — I10 Essential (primary) hypertension: Secondary | ICD-10-CM | POA: Diagnosis not present

## 2014-09-24 DIAGNOSIS — F329 Major depressive disorder, single episode, unspecified: Secondary | ICD-10-CM | POA: Diagnosis not present

## 2014-09-24 DIAGNOSIS — E669 Obesity, unspecified: Secondary | ICD-10-CM | POA: Diagnosis not present

## 2014-09-25 DIAGNOSIS — E669 Obesity, unspecified: Secondary | ICD-10-CM | POA: Diagnosis not present

## 2014-09-25 DIAGNOSIS — I10 Essential (primary) hypertension: Secondary | ICD-10-CM | POA: Diagnosis not present

## 2014-09-25 DIAGNOSIS — F329 Major depressive disorder, single episode, unspecified: Secondary | ICD-10-CM | POA: Diagnosis not present

## 2014-09-26 DIAGNOSIS — E669 Obesity, unspecified: Secondary | ICD-10-CM | POA: Diagnosis not present

## 2014-09-26 DIAGNOSIS — M2550 Pain in unspecified joint: Secondary | ICD-10-CM | POA: Diagnosis not present

## 2014-09-26 DIAGNOSIS — I10 Essential (primary) hypertension: Secondary | ICD-10-CM | POA: Diagnosis not present

## 2014-09-26 DIAGNOSIS — F329 Major depressive disorder, single episode, unspecified: Secondary | ICD-10-CM | POA: Diagnosis not present

## 2014-09-26 DIAGNOSIS — I5022 Chronic systolic (congestive) heart failure: Secondary | ICD-10-CM | POA: Diagnosis not present

## 2014-09-27 DIAGNOSIS — F329 Major depressive disorder, single episode, unspecified: Secondary | ICD-10-CM | POA: Diagnosis not present

## 2014-09-27 DIAGNOSIS — E669 Obesity, unspecified: Secondary | ICD-10-CM | POA: Diagnosis not present

## 2014-09-27 DIAGNOSIS — I10 Essential (primary) hypertension: Secondary | ICD-10-CM | POA: Diagnosis not present

## 2014-09-28 DIAGNOSIS — F329 Major depressive disorder, single episode, unspecified: Secondary | ICD-10-CM | POA: Diagnosis not present

## 2014-09-28 DIAGNOSIS — E669 Obesity, unspecified: Secondary | ICD-10-CM | POA: Diagnosis not present

## 2014-09-28 DIAGNOSIS — I10 Essential (primary) hypertension: Secondary | ICD-10-CM | POA: Diagnosis not present

## 2014-09-29 DIAGNOSIS — F329 Major depressive disorder, single episode, unspecified: Secondary | ICD-10-CM | POA: Diagnosis not present

## 2014-09-29 DIAGNOSIS — E669 Obesity, unspecified: Secondary | ICD-10-CM | POA: Diagnosis not present

## 2014-09-29 DIAGNOSIS — I10 Essential (primary) hypertension: Secondary | ICD-10-CM | POA: Diagnosis not present

## 2014-09-30 DIAGNOSIS — F329 Major depressive disorder, single episode, unspecified: Secondary | ICD-10-CM | POA: Diagnosis not present

## 2014-09-30 DIAGNOSIS — E669 Obesity, unspecified: Secondary | ICD-10-CM | POA: Diagnosis not present

## 2014-09-30 DIAGNOSIS — I10 Essential (primary) hypertension: Secondary | ICD-10-CM | POA: Diagnosis not present

## 2014-10-01 DIAGNOSIS — J449 Chronic obstructive pulmonary disease, unspecified: Secondary | ICD-10-CM | POA: Diagnosis not present

## 2014-10-01 DIAGNOSIS — R609 Edema, unspecified: Secondary | ICD-10-CM | POA: Diagnosis not present

## 2014-10-11 ENCOUNTER — Ambulatory Visit: Payer: 59 | Admitting: Family Medicine

## 2014-10-12 ENCOUNTER — Ambulatory Visit: Payer: 59 | Admitting: Family Medicine

## 2014-10-27 DIAGNOSIS — I5022 Chronic systolic (congestive) heart failure: Secondary | ICD-10-CM | POA: Diagnosis not present

## 2014-10-27 DIAGNOSIS — M2550 Pain in unspecified joint: Secondary | ICD-10-CM | POA: Diagnosis not present

## 2014-11-01 DIAGNOSIS — J449 Chronic obstructive pulmonary disease, unspecified: Secondary | ICD-10-CM | POA: Diagnosis not present

## 2014-11-01 DIAGNOSIS — R609 Edema, unspecified: Secondary | ICD-10-CM | POA: Diagnosis not present

## 2014-11-09 ENCOUNTER — Ambulatory Visit: Payer: Medicare Other | Admitting: Family Medicine

## 2014-11-12 ENCOUNTER — Encounter: Payer: Self-pay | Admitting: Family Medicine

## 2014-11-12 ENCOUNTER — Ambulatory Visit: Payer: Medicare Other | Admitting: Family Medicine

## 2014-11-12 ENCOUNTER — Ambulatory Visit (INDEPENDENT_AMBULATORY_CARE_PROVIDER_SITE_OTHER): Payer: Medicare Other | Admitting: Family Medicine

## 2014-11-12 VITALS — BP 80/60 | HR 76 | Temp 98.0°F | Resp 22 | Ht 61.5 in | Wt 205.0 lb

## 2014-11-12 DIAGNOSIS — I959 Hypotension, unspecified: Secondary | ICD-10-CM | POA: Diagnosis not present

## 2014-11-12 DIAGNOSIS — R531 Weakness: Secondary | ICD-10-CM | POA: Diagnosis not present

## 2014-11-12 LAB — COMPLETE METABOLIC PANEL WITH GFR
ALT: <8 U/L (ref 0–35)
AST: 10 U/L (ref 0–37)
Albumin: 3.4 g/dL — ABNORMAL LOW (ref 3.5–5.2)
Alkaline Phosphatase: 117 U/L (ref 39–117)
BUN: 47 mg/dL — AB (ref 6–23)
CALCIUM: 8.8 mg/dL (ref 8.4–10.5)
CHLORIDE: 103 meq/L (ref 96–112)
CO2: 30 meq/L (ref 19–32)
Creat: 1.81 mg/dL — ABNORMAL HIGH (ref 0.50–1.10)
GFR, EST AFRICAN AMERICAN: 28 mL/min — AB
GFR, Est Non African American: 24 mL/min — ABNORMAL LOW
Glucose, Bld: 76 mg/dL (ref 70–99)
Potassium: 4.6 mEq/L (ref 3.5–5.3)
Sodium: 140 mEq/L (ref 135–145)
Total Bilirubin: 0.4 mg/dL (ref 0.2–1.2)
Total Protein: 7.1 g/dL (ref 6.0–8.3)

## 2014-11-12 LAB — CBC W/MCH & 3 PART DIFF
HCT: 31.1 % — ABNORMAL LOW (ref 36.0–46.0)
Hemoglobin: 9.8 g/dL — ABNORMAL LOW (ref 12.0–15.0)
LYMPHS ABS: 1.9 10*3/uL (ref 0.7–4.0)
LYMPHS PCT: 34 % (ref 12–46)
MCH: 30.2 pg (ref 26.0–34.0)
MCHC: 31.5 g/dL (ref 30.0–36.0)
MCV: 95.7 fL (ref 78.0–100.0)
NEUTROS ABS: 2.7 10*3/uL (ref 1.7–7.7)
Neutrophils Relative %: 48 % (ref 43–77)
Platelets: 161 10*3/uL (ref 150–400)
RBC: 3.25 MIL/uL — ABNORMAL LOW (ref 3.87–5.11)
RDW: 13.9 % (ref 11.5–15.5)
WBC mixed population %: 18 % (ref 3–18)
WBC: 5.6 10*3/uL (ref 4.0–10.5)
WBCMIX: 1 10*3/uL (ref 0.1–1.8)

## 2014-11-12 MED ORDER — CEFTRIAXONE SODIUM 1 G IJ SOLR
1.0000 g | Freq: Once | INTRAMUSCULAR | Status: AC
  Administered 2014-11-12: 1 g via INTRAMUSCULAR

## 2014-11-12 NOTE — Addendum Note (Signed)
Addended by: Shary Decamp B on: 11/12/2014 02:46 PM   Modules accepted: Orders

## 2014-11-12 NOTE — Progress Notes (Signed)
Subjective:    Patient ID: [name], female    DOB: 08/12/1922, 81 y.o.   MRN: [id number]  HPI   She is a 81 year old African-American female with a significant past medical history of tachybradycardia syndrome status post pacemaker placement, chronic kidney disease, congestive heart failure with an ejection fraction of 25-35% presents with one-week of severe weakness. She states repeatedly that she is just weak and tired. Vital signs are significant for significant hypotension with a blood pressure of 80/60. Patient is also tachypneic on exam with RR of 30.  She denies shortness of breath. She denies orthopnea. She denies paroxysmal maternal dyspnea. She repeatedly states that she is just tired and weak.  Patient weighed 208 pounds in March. She is 205 pounds today. There is no evidence of fluid overload on her exam. Past Medical History  Diagnosis Date  . Hypertension   . Obesity   . Depression   . Tachy-brady syndrome     a. 03/2006 s/p SJM 5356 Verity ADx XLDR DC PPM (ser#: [id number]).  . Gout   . Chronic systolic CHF (congestive heart failure)     a. 10/2013 Echo: EF 25-30%, diff HK, Gr 1 DD, triv AI, mild to mod MR, dev dil LA, mild RV dysfxn, mildly dil RA, mod TR.  . Cardiomyopathy     a. ? ischemic vs non-ischemic;  b. 09/2013 Echo: EF 25-30%.  Marland Kitchen COPD (chronic obstructive pulmonary disease)   . Glaucoma of both eyes   . GERD (gastroesophageal reflux disease)   . Presence of permanent cardiac pacemaker   . CKD (chronic kidney disease), stage III   . Kidney stones   . Arthritis     "knees" (07/23/2014)   Past Surgical History  Procedure Laterality Date  . Cardiac catheterization  10/2000    Archie Endo 10/10/2010  . Insert / replace / remove pacemaker  08/2000; 03/2006    Archie Endo 10/07/2010  . Cholecystectomy open    . Kidney stone surgery      "hospitalized for 19 days"  . Tubal ligation     Current Outpatient Prescriptions on File Prior to Visit  Medication Sig Dispense  Refill  . acetaminophen (TYLENOL) 650 MG CR tablet Take 650 mg by mouth every 8 (eight) hours as needed for pain.    Marland Kitchen albuterol (PROVENTIL) (2.5 MG/3ML) 0.083% nebulizer solution INHALE 1 VIAL VIA NEBULIZER EVERY 6 HOURS AS NEEDED FOR WHEEZING AND SHORTNESS OF BREATH 150 mL 2  . allopurinol (ZYLOPRIM) 100 MG tablet TAKE 2 TABLETS BY MOUTH ONCE DAILY 60 tablet 5  . amLODipine (NORVASC) 5 MG tablet Take 5 mg by mouth daily.    Marland Kitchen aspirin 81 MG tablet Take 1 tablet (81 mg total) by mouth daily. 30 tablet 11  . calcium carbonate (TUMS - DOSED IN MG ELEMENTAL CALCIUM) 500 MG chewable tablet Chew 1 tablet by mouth daily.    . carvedilol (COREG) 25 MG tablet TAKE 1 TABLET BY MOUTH TWICE DAILY WITH A MEAL 60 tablet 10  . citalopram (CELEXA) 10 MG tablet TAKE 1 TABLET BY MOUTH DAILY 30 tablet 5  . DIGOX 125 MCG tablet TAKE ONE TABLET BY MOUTH DAILY 30 tablet 3  . FERROUS SULFATE PO Take 65 mg by mouth daily.    . furosemide (LASIX) 40 MG tablet Take 1 tablet (40 mg total) by mouth 2 (two) times daily. 60 tablet 3  . omeprazole (PRILOSEC) 40 MG capsule TAKE 1 CAPSULE BY MOUTH DAILY 30 capsule 11  No current facility-administered medications on file prior to visit.   No Known Allergies History   Social History  . Marital Status: Widowed    Spouse Name: N/A  . Number of Children: N/A  . Years of Education: N/A   Occupational History  . Not on file.   Social History Main Topics  . Smoking status: Former Smoker    Types: Cigarettes  . Smokeless tobacco: Never Used     Comment: "never inhaled cigarettes; just lit them"  . Alcohol Use: Yes     Comment: 07/23/2014 "I used to drink beer; nothing in 3-4 years"  . Drug Use: No  . Sexual Activity: No   Other Topics Concern  . Not on file   Social History Narrative      Review of Systems  All other systems reviewed and are negative.      Objective:   Physical Exam  Constitutional: She appears well-developed and well-nourished. No  distress.  Neck: No JVD present.  Cardiovascular: An irregular rhythm present.  Pulmonary/Chest: Tachypnea noted. She has no decreased breath sounds. She has no wheezes. She has rhonchi.  Abdominal: Soft. Bowel sounds are normal.  Musculoskeletal: She exhibits no edema.  Skin: She is not diaphoretic.  Vitals reviewed.  Stat CBC reveals a white blood cell count 5.6, hemoglobin of 9.8, hematocrit of 31.1, and a platelet count of 161. This is essentially unchanged from March  EKG shows sinus rhythm with an occasional PVC. There is downsloping ST depression in lead 1 and aVL which is chronic and unchanged from March. Otherwise there are no significant changes on her EKG.    Assessment & Plan:  Hypotension, unspecified hypotension type - Plan: COMPLETE METABOLIC PANEL WITH GFR, CBC w/MCH & 3 Part Diff, EKG 12-Lead  At the present time there is no significant change in her CBC from March. Patient's weight is 3 pounds less than March. There is no evidence of fluid overload. There is no evidence of fever. There is no hypoxia on examination. I believe the patient's weakness is due to her low blood pressure. She is 80/60.  Recheck on my exam was 100/60 after movement.   At the present time I believe we need to temporarily discontinue some of her blood pressure medication and also temporarily hold her fluid pill. Therefore I'll have the patient temporarily discontinue amlodipine and hold Lasix.  I want to recheck her tomorrow. Hopefully her blood pressure was start to improve. If her symptoms worsen I want her to go to the hospital.  I would also like to have the patient give me a urine sample to check for urinary tract infection. However the patient is unable to urinate at the present time. Patient is incontinent of urine and I suspect that she may have a urinary tract infection causing her weakness. I'm unable to get a urine sample the present time. Therefore I'm going to empirically treat the patient with  Rocephin 1 g IM and recheck the patient tomorrow.

## 2014-11-13 ENCOUNTER — Ambulatory Visit (INDEPENDENT_AMBULATORY_CARE_PROVIDER_SITE_OTHER): Payer: Medicare Other | Admitting: Family Medicine

## 2014-11-13 ENCOUNTER — Encounter: Payer: Self-pay | Admitting: Family Medicine

## 2014-11-13 VITALS — BP 118/70 | HR 82 | Temp 98.0°F | Resp 24 | Wt 205.0 lb

## 2014-11-13 DIAGNOSIS — R531 Weakness: Secondary | ICD-10-CM

## 2014-11-13 LAB — URINALYSIS, MICROSCOPIC ONLY: Crystals: NONE SEEN

## 2014-11-13 LAB — URINALYSIS, ROUTINE W REFLEX MICROSCOPIC
Bilirubin Urine: NEGATIVE
GLUCOSE, UA: NEGATIVE mg/dL
HGB URINE DIPSTICK: NEGATIVE
KETONES UR: NEGATIVE mg/dL
Nitrite: NEGATIVE
PH: 5.5 (ref 5.0–8.0)
Protein, ur: NEGATIVE mg/dL
Specific Gravity, Urine: 1.015 (ref 1.005–1.030)
Urobilinogen, UA: 0.2 mg/dL (ref 0.0–1.0)

## 2014-11-13 MED ORDER — CIPROFLOXACIN HCL 250 MG PO TABS: 250.0000 mg | ORAL_TABLET | Freq: Two times a day (BID) | ORAL | Status: DC

## 2014-11-13 MED ORDER — FUROSEMIDE 20 MG PO TABS: 20.0000 mg | ORAL_TABLET | Freq: Two times a day (BID) | ORAL | Status: DC

## 2014-11-13 MED ORDER — CEFTRIAXONE SODIUM 1 G IJ SOLR
1.0000 g | Freq: Once | INTRAMUSCULAR | Status: AC
  Administered 2014-11-13: 1 g via INTRAMUSCULAR

## 2014-11-13 NOTE — Addendum Note (Signed)
Addended by: Shary Decamp B on: 11/13/2014 01:06 PM   Modules accepted: Orders

## 2014-11-13 NOTE — Addendum Note (Signed)
Addended by: Shary Decamp B on: 11/13/2014 05:10 PM   Modules accepted: Orders

## 2014-11-13 NOTE — Progress Notes (Signed)
Subjective:    Patient ID: [name], female    DOB: Oct 08, 1922, 81 y.o.   MRN: [id number]  HPI  11/12/14  She is a 81 year old African-American female with a significant past medical history of tachybradycardia syndrome status post pacemaker placement, chronic kidney disease, congestive heart failure with an ejection fraction of 25-35% presents with one-week of severe weakness. She states repeatedly that she is just weak and tired. Vital signs are significant for significant hypotension with a blood pressure of 80/60. Patient is also tachypneic on exam with RR of 30.  She denies shortness of breath. She denies orthopnea. She denies paroxysmal maternal dyspnea. She repeatedly states that she is just tired and weak.  Patient weighed 208 pounds in March. She is 205 pounds today. There is no evidence of fluid overload on her exam.  At that time, my plan was: At the present time there is no significant change in her CBC from March. Patient's weight is 3 pounds less than March. There is no evidence of fluid overload. There is no evidence of fever. There is no hypoxia on examination. I believe the patient's weakness is due to her low blood pressure. She is 80/60.  Recheck on my exam was 100/60 after movement.   At the present time I believe we need to temporarily discontinue some of her blood pressure medication and also temporarily hold her fluid pill. Therefore I'll have the patient temporarily discontinue amlodipine and hold Lasix.  I want to recheck her tomorrow. Hopefully her blood pressure was start to improve. If her symptoms worsen I want her to go to the hospital.  I would also like to have the patient give me a urine sample to check for urinary tract infection. However the patient is unable to urinate at the present time. Patient is incontinent of urine and I suspect that she may have a urinary tract infection causing her weakness. I'm unable to get a urine sample the present time. Therefore I'm  going to empirically treat the patient with Rocephin 1 g IM and recheck the patient tomorrow.  11/13/14 Here today for follow up.  Patient's weight is unchanged even though she has not taken her fluid pill since yesterday.  Patient states that she feels much better today.  Patient's blood pressure is much better today. I actually got 130/76.  However on examination she does have faint left basilar crackles and rales that I can appreciate today on exam. Yesterday I discontinued amlodipine and had the patient temporarily hold her Lasix.  I also gave the patient an injection of Rocephin presumptively for a possible urinary tract infection.  Today I'm trying to determine if the patient feels better due to her blood pressure improving or secondary to the [employer] or possibly a combination of both. Today the patient states that she can give [place] a urine sample. Past Medical History  Diagnosis Date  . Hypertension   . Obesity   . Depression   . Tachy-brady syndrome     a. 03/2006 s/p SJM 5356 Verity ADx XLDR DC PPM (ser#: [id number]).  . Gout   . Chronic systolic CHF (congestive heart failure)     a. 10/2013 Echo: EF 25-30%, diff HK, Gr 1 DD, triv AI, mild to mod MR, dev dil LA, mild RV dysfxn, mildly dil RA, mod TR.  . Cardiomyopathy     a. ? ischemic vs non-ischemic;  b. 09/2013 Echo: EF 25-30%.  Marland Kitchen COPD (chronic obstructive pulmonary disease)   .  Glaucoma of both eyes   . GERD (gastroesophageal reflux disease)   . Presence of permanent cardiac pacemaker   . CKD (chronic kidney disease), stage III   . Kidney stones   . Arthritis     "knees" (07/23/2014)   Past Surgical History  Procedure Laterality Date  . Cardiac catheterization  10/2000    Archie Endo 10/10/2010  . Insert / replace / remove pacemaker  08/2000; 03/2006    Archie Endo 10/07/2010  . Cholecystectomy open    . Kidney stone surgery      "hospitalized for 19 days"  . Tubal ligation     Current Outpatient Prescriptions on File Prior to Visit    Medication Sig Dispense Refill  . acetaminophen (TYLENOL) 650 MG CR tablet Take 650 mg by mouth every 8 (eight) hours as needed for pain.    Marland Kitchen albuterol (PROVENTIL) (2.5 MG/3ML) 0.083% nebulizer solution INHALE 1 VIAL VIA NEBULIZER EVERY 6 HOURS AS NEEDED FOR WHEEZING AND SHORTNESS OF BREATH 150 mL 2  . allopurinol (ZYLOPRIM) 100 MG tablet TAKE 2 TABLETS BY MOUTH ONCE DAILY 60 tablet 5  . amLODipine (NORVASC) 5 MG tablet Take 5 mg by mouth daily.    Marland Kitchen aspirin 81 MG tablet Take 1 tablet (81 mg total) by mouth daily. 30 tablet 11  . calcium carbonate (TUMS - DOSED IN MG ELEMENTAL CALCIUM) 500 MG chewable tablet Chew 1 tablet by mouth daily.    . carvedilol (COREG) 25 MG tablet TAKE 1 TABLET BY MOUTH TWICE DAILY WITH A MEAL 60 tablet 10  . citalopram (CELEXA) 10 MG tablet TAKE 1 TABLET BY MOUTH DAILY 30 tablet 5  . DIGOX 125 MCG tablet TAKE ONE TABLET BY MOUTH DAILY 30 tablet 3  . FERROUS SULFATE PO Take 65 mg by mouth daily.    . furosemide (LASIX) 40 MG tablet Take 1 tablet (40 mg total) by mouth 2 (two) times daily. 60 tablet 3  . omeprazole (PRILOSEC) 40 MG capsule TAKE 1 CAPSULE BY MOUTH DAILY 30 capsule 11  . OXYGEN Inhale 2 L into the lungs continuous.     No current facility-administered medications on file prior to visit.   No Known Allergies History   Social History  . Marital Status: Widowed    Spouse Name: N/A  . Number of Children: N/A  . Years of Education: N/A   Occupational History  . Not on file.   Social History Main Topics  . Smoking status: Former Smoker    Types: Cigarettes  . Smokeless tobacco: Never Used     Comment: "never inhaled cigarettes; just lit them"  . Alcohol Use: Yes     Comment: 07/23/2014 "I used to drink beer; nothing in 3-4 years"  . Drug Use: No  . Sexual Activity: No   Other Topics Concern  . Not on file   Social History Narrative      Review of Systems  All other systems reviewed and are negative.      Objective:   Physical  Exam  Constitutional: She appears well-developed and well-nourished. No distress.  Neck: No JVD present.  Cardiovascular: Normal rate, regular rhythm and normal heart sounds.   Pulmonary/Chest: No tachypnea. She has no decreased breath sounds. She has no wheezes. She has no rhonchi. She has rales.  Abdominal: Soft. Bowel sounds are normal.  Musculoskeletal: She exhibits no edema.  Skin: She is not diaphoretic.  Vitals reviewed. faint crackles in the left lower lobe posteriorly.    Assessment &  Plan:  Weakness - Plan: Urinalysis, Routine w reflex microscopic (not at [facility])  Even after Rocephin, urinalysis suggested possible urinary tract infection.  Therefore I will treat the patient with Cipro 250 mg by mouth twice a day for 5 days to treat possible urinary tract infection. I will also give the patient 1 g of Rocephin today since she is not able to get the anabolic until tomorrow. This will complete 7 days for possible urinary tract infection which was complicated by hypotension.  I will have the patient resume her Lasix given the crackles that I am appreciating on her pulmonary exam. However I will have her continue to hold amlodipine as her blood pressure seems not to require this medication at the present time. I would like to recheck her in one week.  As the patient improves, we will likely have to resume amlodipine

## 2014-11-14 LAB — CULTURE, URINE COMPREHENSIVE
COLONY COUNT: NO GROWTH
Organism ID, Bacteria: NO GROWTH

## 2014-11-26 DIAGNOSIS — M2550 Pain in unspecified joint: Secondary | ICD-10-CM | POA: Diagnosis not present

## 2014-11-26 DIAGNOSIS — I5022 Chronic systolic (congestive) heart failure: Secondary | ICD-10-CM | POA: Diagnosis not present

## 2014-11-27 ENCOUNTER — Encounter: Payer: Self-pay | Admitting: Family Medicine

## 2014-11-27 ENCOUNTER — Ambulatory Visit (INDEPENDENT_AMBULATORY_CARE_PROVIDER_SITE_OTHER): Payer: Medicare Other | Admitting: Family Medicine

## 2014-11-27 VITALS — BP 150/84 | HR 84 | Temp 97.8°F | Resp 22 | Ht 61.5 in | Wt 204.0 lb

## 2014-11-27 DIAGNOSIS — R531 Weakness: Secondary | ICD-10-CM

## 2014-11-27 MED ORDER — AMLODIPINE BESYLATE 5 MG PO TABS: 5.0000 mg | ORAL_TABLET | Freq: Every day | ORAL | Status: DC

## 2014-11-27 NOTE — Progress Notes (Signed)
Subjective:    Patient ID: [name], female    DOB: May 25, 1923, 81 y.o.   MRN: [id number]  HPI  11/12/14  She is a 81 year old African-American female with a significant past medical history of tachybradycardia syndrome status post pacemaker placement, chronic kidney disease, congestive heart failure with an ejection fraction of 25-35% presents with one-week of severe weakness. She states repeatedly that she is just weak and tired. Vital signs are significant for significant hypotension with a blood pressure of 80/60. Patient is also tachypneic on exam with RR of 30.  She denies shortness of breath. She denies orthopnea. She denies paroxysmal maternal dyspnea. She repeatedly states that she is just tired and weak.  Patient weighed 208 pounds in March. She is 205 pounds today. There is no evidence of fluid overload on her exam.  At that time, my plan was: At the present time there is no significant change in her CBC from March. Patient's weight is 3 pounds less than March. There is no evidence of fluid overload. There is no evidence of fever. There is no hypoxia on examination. I believe the patient's weakness is due to her low blood pressure. She is 80/60.  Recheck on my exam was 100/60 after movement.   At the present time I believe we need to temporarily discontinue some of her blood pressure medication and also temporarily hold her fluid pill. Therefore I'll have the patient temporarily discontinue amlodipine and hold Lasix.  I want to recheck her tomorrow. Hopefully her blood pressure was start to improve. If her symptoms worsen I want her to go to the hospital.  I would also like to have the patient give me a urine sample to check for urinary tract infection. However the patient is unable to urinate at the present time. Patient is incontinent of urine and I suspect that she may have a urinary tract infection causing her weakness. I'm unable to get a urine sample the present time. Therefore I'm  going to empirically treat the patient with Rocephin 1 g IM and recheck the patient tomorrow.  11/13/14 Here today for follow up.  Patient's weight is unchanged even though she has not taken her fluid pill since yesterday.  Patient states that she feels much better today.  Patient's blood pressure is much better today. I actually got 130/76.  However on examination she does have faint left basilar crackles and rales that I can appreciate today on exam. Yesterday I discontinued amlodipine and had the patient temporarily hold her Lasix.  I also gave the patient an injection of Rocephin presumptively for a possible urinary tract infection.  Today I'm trying to determine if the patient feels better due to her blood pressure improving or secondary to the antibiotics or possibly a combination of both. Today the patient states that she can give [place] a urine sample.  At that time, my plan was: Even after Rocephin, urinalysis suggested possible urinary tract infection.  Therefore I will treat the patient with Cipro 250 mg by mouth twice a day for 5 days to treat possible urinary tract infection. I will also give the patient 1 g of Rocephin today since she is not able to get the antibiotic until tomorrow. This will complete 7 days for possible urinary tract infection which was complicated by hypotension.  I will have the patient resume her Lasix given the crackles that I am appreciating on her pulmonary exam. However I will have her continue to hold amlodipine as her  blood pressure seems not to require this medication at the present time. I would like to recheck her in one week.  As the patient improves, we will likely have to resume amlodipine  11/27/14 Patient feels much better. Her strength is improving.  She continues to complain of fatigue and shortness of breath but this is chronic and related to her significant cardiomyopathy. As the infection has cleared her blood pressure has started to rise. She denies any chest  pain. She denies any pain. She denies any dysuria. She denies any fevers or chills or abdominal pain. Past Medical History  Diagnosis Date  . Hypertension   . Obesity   . Depression   . Tachy-brady syndrome     a. 03/2006 s/p SJM 5356 Verity ADx XLDR DC PPM (ser#: [id number]).  . Gout   . Chronic systolic CHF (congestive heart failure)     a. 10/2013 Echo: EF 25-30%, diff HK, Gr 1 DD, triv AI, mild to mod MR, dev dil LA, mild RV dysfxn, mildly dil RA, mod TR.  . Cardiomyopathy     a. ? ischemic vs non-ischemic;  b. 09/2013 Echo: EF 25-30%.  Marland Kitchen COPD (chronic obstructive pulmonary disease)   . Glaucoma of both eyes   . GERD (gastroesophageal reflux disease)   . Presence of permanent cardiac pacemaker   . CKD (chronic kidney disease), stage III   . Kidney stones   . Arthritis     "knees" (07/23/2014)   Past Surgical History  Procedure Laterality Date  . Cardiac catheterization  10/2000    Archie Endo 10/10/2010  . Insert / replace / remove pacemaker  08/2000; 03/2006    Archie Endo 10/07/2010  . Cholecystectomy open    . Kidney stone surgery      "hospitalized for 19 days"  . Tubal ligation     Current Outpatient Prescriptions on File Prior to Visit  Medication Sig Dispense Refill  . acetaminophen (TYLENOL) 650 MG CR tablet Take 650 mg by mouth every 8 (eight) hours as needed for pain.    Marland Kitchen albuterol (PROVENTIL) (2.5 MG/3ML) 0.083% nebulizer solution INHALE 1 VIAL VIA NEBULIZER EVERY 6 HOURS AS NEEDED FOR WHEEZING AND SHORTNESS OF BREATH 150 mL 2  . allopurinol (ZYLOPRIM) 100 MG tablet TAKE 2 TABLETS BY MOUTH ONCE DAILY 60 tablet 5  . amLODipine (NORVASC) 5 MG tablet Take 5 mg by mouth daily.    Marland Kitchen aspirin 81 MG tablet Take 1 tablet (81 mg total) by mouth daily. 30 tablet 11  . calcium carbonate (TUMS - DOSED IN MG ELEMENTAL CALCIUM) 500 MG chewable tablet Chew 1 tablet by mouth daily.    . carvedilol (COREG) 25 MG tablet TAKE 1 TABLET BY MOUTH TWICE DAILY WITH A MEAL 60 tablet 10  . ciprofloxacin  (CIPRO) 250 MG tablet Take 1 tablet (250 mg total) by mouth 2 (two) times daily. 10 tablet 0  . citalopram (CELEXA) 10 MG tablet TAKE 1 TABLET BY MOUTH DAILY 30 tablet 5  . DIGOX 125 MCG tablet TAKE ONE TABLET BY MOUTH DAILY 30 tablet 3  . FERROUS SULFATE PO Take 65 mg by mouth daily.    . furosemide (LASIX) 20 MG tablet Take 1 tablet (20 mg total) by mouth 2 (two) times daily. 60 tablet 3  . omeprazole (PRILOSEC) 40 MG capsule TAKE 1 CAPSULE BY MOUTH DAILY 30 capsule 11  . OXYGEN Inhale 2 L into the lungs continuous.     No current facility-administered medications on file prior to visit.  No Known Allergies History   Social History  . Marital Status: Widowed    Spouse Name: N/A  . Number of Children: N/A  . Years of Education: N/A   Occupational History  . Not on file.   Social History Main Topics  . Smoking status: Former Smoker    Types: Cigarettes  . Smokeless tobacco: Never Used     Comment: "never inhaled cigarettes; just lit them"  . Alcohol Use: Yes     Comment: 07/23/2014 "I used to drink beer; nothing in 3-4 years"  . Drug Use: No  . Sexual Activity: No   Other Topics Concern  . Not on file   Social History Narrative      Review of Systems  All other systems reviewed and are negative.      Objective:   Physical Exam  Constitutional: She appears well-developed and well-nourished. No distress.  Neck: No JVD present.  Cardiovascular: Normal rate, regular rhythm and normal heart sounds.   Pulmonary/Chest: No tachypnea. She has no decreased breath sounds. She has no wheezes. She has no rhonchi. She has rales.  Abdominal: Soft. Bowel sounds are normal.  Musculoskeletal: She exhibits no edema.  Skin: She is not diaphoretic.  Vitals reviewed. faint crackles in the left lower lobe posteriorly.    Assessment & Plan:  Weakness  Clinically the patient seems to be improving from her urinary tract infection.  There is no indication for further anti-biotics.   I recommended the patient resume amlodipine 5 mg by mouth daily as her blood pressure has started to rise as the infection has cleared

## 2014-11-27 NOTE — Patient Instructions (Signed)
Blood pressure is elevated now.  Resume amlodipine 5 mg every day.  Prescription was sent to your pharmacy.

## 2014-12-01 DIAGNOSIS — R609 Edema, unspecified: Secondary | ICD-10-CM | POA: Diagnosis not present

## 2014-12-01 DIAGNOSIS — J449 Chronic obstructive pulmonary disease, unspecified: Secondary | ICD-10-CM | POA: Diagnosis not present

## 2014-12-06 ENCOUNTER — Telehealth: Payer: Self-pay | Admitting: Family Medicine

## 2014-12-06 MED ORDER — UNABLE TO FIND: Status: DC

## 2014-12-06 NOTE — Telephone Encounter (Signed)
Thanks

## 2014-12-06 NOTE — Telephone Encounter (Signed)
Pt needs more portable oxygen.  She called [facility].  They need new order.  I called them and said send order for "Portable Oxygen evaluation for Simply Go Mini"  Rx written and faxed to [facility].

## 2014-12-19 ENCOUNTER — Telehealth: Payer: Self-pay | Admitting: Family Medicine

## 2014-12-19 NOTE — Telephone Encounter (Signed)
Pt calling.  All confused about her medications.  Doesn't feel she is getting the correct amounts??  I called [facility] and verified her medications.  They are going to reach out to her and also due for next delivery in two days.  They will be sure to double check home inventory.

## 2014-12-20 ENCOUNTER — Other Ambulatory Visit: Payer: Self-pay | Admitting: Family Medicine

## 2014-12-21 DIAGNOSIS — R51 Headache: Secondary | ICD-10-CM | POA: Diagnosis not present

## 2014-12-21 NOTE — Telephone Encounter (Signed)
Medication refilled per protocol. 

## 2014-12-27 DIAGNOSIS — M2550 Pain in unspecified joint: Secondary | ICD-10-CM | POA: Diagnosis not present

## 2014-12-27 DIAGNOSIS — I5022 Chronic systolic (congestive) heart failure: Secondary | ICD-10-CM | POA: Diagnosis not present

## 2015-01-01 DIAGNOSIS — J449 Chronic obstructive pulmonary disease, unspecified: Secondary | ICD-10-CM | POA: Diagnosis not present

## 2015-01-01 DIAGNOSIS — R609 Edema, unspecified: Secondary | ICD-10-CM | POA: Diagnosis not present

## 2015-01-03 ENCOUNTER — Encounter: Payer: Self-pay | Admitting: Family Medicine

## 2015-01-03 ENCOUNTER — Ambulatory Visit (INDEPENDENT_AMBULATORY_CARE_PROVIDER_SITE_OTHER): Payer: Medicare Other | Admitting: Family Medicine

## 2015-01-03 VITALS — BP 136/62 | HR 74 | Temp 98.6°F | Resp 22 | Ht 61.5 in | Wt 202.0 lb

## 2015-01-03 DIAGNOSIS — G43A0 Cyclical vomiting, not intractable: Secondary | ICD-10-CM | POA: Diagnosis not present

## 2015-01-03 DIAGNOSIS — R1115 Cyclical vomiting syndrome unrelated to migraine: Secondary | ICD-10-CM

## 2015-01-03 DIAGNOSIS — R1314 Dysphagia, pharyngoesophageal phase: Secondary | ICD-10-CM

## 2015-01-03 MED ORDER — PANTOPRAZOLE SODIUM 40 MG PO TBEC
40.0000 mg | DELAYED_RELEASE_TABLET | Freq: Every day | ORAL | Status: DC
Start: 2015-01-03 — End: 2015-02-07

## 2015-01-03 MED ORDER — METOCLOPRAMIDE HCL 10 MG PO TABS: 10.0000 mg | ORAL_TABLET | Freq: Four times a day (QID) | ORAL | Status: DC

## 2015-01-03 NOTE — Progress Notes (Signed)
Subjective:    Patient ID: [name], female    DOB: 1922/07/27, 81 y.o.   MRN: [id number]  HPI   patient is a 81 year old African-American female who states for the last 4 weeks she has been unable to tolerate solid food. She is able to eat soup and drink liquids without problems. She is able to eat soft foods such as applesauce or mashed potatoes without nausea or vomiting. However when she eats solid foods such as chicken or beef, she will develop nausea and vomiting within a few minutes of eating. Symptoms sound similar to a stricture although she denies food getting stuck in her throat. It certainly sounds like there is some element of obstruction or blockage. She denies any constipation. She denies any melanoma or hematochezia or hematemesis. Patient has congestive heart failure with an ejection fraction between 20 and 30% and due to her advanced age and history of tachybradycardia syndrome she is a high-risk patient for anesthesia. Therefore I'm hesitant to send the patient for an EGD unless we are certain what is causing her problem. Past Medical History  Diagnosis Date  . Hypertension   . Obesity   . Depression   . Tachy-brady syndrome     a. 03/2006 s/p SJM 5356 Verity ADx XLDR DC PPM (ser#: [id number]).  . Gout   . Chronic systolic CHF (congestive heart failure)     a. 10/2013 Echo: EF 25-30%, diff HK, Gr 1 DD, triv AI, mild to mod MR, dev dil LA, mild RV dysfxn, mildly dil RA, mod TR.  . Cardiomyopathy     a. ? ischemic vs non-ischemic;  b. 09/2013 Echo: EF 25-30%.  Marland Kitchen COPD (chronic obstructive pulmonary disease)   . Glaucoma of both eyes   . GERD (gastroesophageal reflux disease)   . Presence of permanent cardiac pacemaker   . CKD (chronic kidney disease), stage III   . Kidney stones   . Arthritis     "knees" (07/23/2014)   Past Surgical History  Procedure Laterality Date  . Cardiac catheterization  10/2000    Archie Endo 10/10/2010  . Insert / replace / remove pacemaker   08/2000; 03/2006    Archie Endo 10/07/2010  . Cholecystectomy open    . Kidney stone surgery      "hospitalized for 19 days"  . Tubal ligation     Current Outpatient Prescriptions on File Prior to Visit  Medication Sig Dispense Refill  . acetaminophen (TYLENOL) 650 MG CR tablet Take 650 mg by mouth every 8 (eight) hours as needed for pain.    Marland Kitchen albuterol (PROVENTIL) (2.5 MG/3ML) 0.083% nebulizer solution INHALE 1 VIAL VIA NEBULIZER EVERY 6 HOURS AS NEEDED FOR WHEEZING AND SHORTNESS OF BREATH 150 mL 5  . allopurinol (ZYLOPRIM) 100 MG tablet TAKE 2 TABLETS BY MOUTH ONCE DAILY 60 tablet 5  . amLODipine (NORVASC) 5 MG tablet Take 1 tablet (5 mg total) by mouth daily. 90 tablet 3  . aspirin 81 MG tablet Take 1 tablet (81 mg total) by mouth daily. 30 tablet 11  . calcium carbonate (TUMS - DOSED IN MG ELEMENTAL CALCIUM) 500 MG chewable tablet Chew 1 tablet by mouth daily.    . carvedilol (COREG) 25 MG tablet TAKE 1 TABLET BY MOUTH TWICE DAILY WITH A MEAL 60 tablet 10  . citalopram (CELEXA) 10 MG tablet TAKE 1 TABLET BY MOUTH DAILY 30 tablet 5  . DIGOX 125 MCG tablet TAKE ONE TABLET BY MOUTH DAILY 30 tablet 3  . FERROUS SULFATE  PO Take 65 mg by mouth daily.    . furosemide (LASIX) 20 MG tablet Take 1 tablet (20 mg total) by mouth 2 (two) times daily. 60 tablet 3  . omeprazole (PRILOSEC) 40 MG capsule TAKE 1 CAPSULE BY MOUTH DAILY 30 capsule 11  . OXYGEN Inhale 2 L into the lungs continuous.    Marland Kitchen UNABLE TO FIND PORTABLE OXYGEN EVALUATION for SIMPLY TO GO MINI Pt on 2l/min continuous 1 each 0   No current facility-administered medications on file prior to visit.   No Known Allergies Social History   Social History  . Marital Status: Widowed    Spouse Name: N/A  . Number of Children: N/A  . Years of Education: N/A   Occupational History  . Not on file.   Social History Main Topics  . Smoking status: Former Smoker    Types: Cigarettes  . Smokeless tobacco: Never Used     Comment: "never  inhaled cigarettes; just lit them"  . Alcohol Use: Yes     Comment: 07/23/2014 "I used to drink beer; nothing in 3-4 years"  . Drug Use: No  . Sexual Activity: No   Other Topics Concern  . Not on file   Social History Narrative     Review of Systems  All other systems reviewed and are negative.      Objective:   Physical Exam  Cardiovascular: Normal rate, regular rhythm and normal heart sounds.   Pulmonary/Chest: Effort normal and breath sounds normal. No respiratory distress. She has no wheezes. She has no rales.  Abdominal: Soft. She exhibits no distension. Bowel sounds are decreased. There is no tenderness. There is no rebound and no guarding.  Vitals reviewed.         Assessment & Plan:  Non-intractable cyclical vomiting with nausea - Plan: metoCLOPramide (REGLAN) 10 MG tablet, pantoprazole (PROTONIX) 40 MG tablet, DG Esophagus  Dysphagia, pharyngoesophageal phase - Plan: DG Esophagus   Patient is still having bowel movements. Abdomen is soft nondistended and nontender therefore I believe the small bowel obstruction is unlikely. I am concerned about a stricture versus  Some type of blockage in the stomach versus gastroparesis. Therefore I will treat with Reglan 10 mg by mouth every before meals and at bedtime and Protonix 40 mg by mouth daily. If symptoms are not improving over the weekend, I will schedule the patient for a barium swallow to evaluate for a stricture or  Mechanical obstruction in the esophagus/stomach.

## 2015-01-09 ENCOUNTER — Telehealth: Payer: Self-pay | Admitting: Family Medicine

## 2015-01-09 MED ORDER — UNABLE TO FIND: Status: DC

## 2015-01-09 NOTE — Telephone Encounter (Signed)
Needs an order for Geri-chair, pull-ups and chux to go to [facility].  Rx's printed to be faxed to [facility] after signed by provider.

## 2015-01-11 ENCOUNTER — Telehealth: Payer: Self-pay | Admitting: Family Medicine

## 2015-01-11 MED ORDER — DICLOFENAC SODIUM 1 % TD GEL: 2.0000 g | Freq: Four times a day (QID) | TRANSDERMAL | Status: DC

## 2015-01-11 NOTE — Telephone Encounter (Signed)
voltaren gel 2 g qid

## 2015-01-11 NOTE — Telephone Encounter (Signed)
Having Rt knee pain all week.  Now can't hardly walk.  Can you call something in that she can rub into her knee to help?  FYI  Nausea pills are working well.

## 2015-01-11 NOTE — Telephone Encounter (Signed)
RX to pharmacy and pt aware 

## 2015-01-14 ENCOUNTER — Inpatient Hospital Stay: Admission: RE | Admit: 2015-01-14 | Payer: Medicare Other | Source: Ambulatory Visit

## 2015-01-14 DIAGNOSIS — M6281 Muscle weakness (generalized): Secondary | ICD-10-CM | POA: Diagnosis not present

## 2015-01-14 DIAGNOSIS — M1990 Unspecified osteoarthritis, unspecified site: Secondary | ICD-10-CM | POA: Diagnosis not present

## 2015-01-15 ENCOUNTER — Telehealth: Payer: Self-pay | Admitting: Family Medicine

## 2015-01-15 NOTE — Telephone Encounter (Signed)
PA submitted thru [facility] Case # [id number] for Diclofenac gel

## 2015-01-16 NOTE — Telephone Encounter (Signed)
Rec'd approval for Diclofenac gel from [facility].  Approved thru 01/15/16  Ref # [id number].  Faxed to pharmacy

## 2015-01-18 DIAGNOSIS — M818 Other osteoporosis without current pathological fracture: Secondary | ICD-10-CM | POA: Diagnosis not present

## 2015-01-18 DIAGNOSIS — J449 Chronic obstructive pulmonary disease, unspecified: Secondary | ICD-10-CM | POA: Diagnosis not present

## 2015-01-18 DIAGNOSIS — M6281 Muscle weakness (generalized): Secondary | ICD-10-CM | POA: Diagnosis not present

## 2015-01-18 DIAGNOSIS — R32 Unspecified urinary incontinence: Secondary | ICD-10-CM | POA: Diagnosis not present

## 2015-01-18 DIAGNOSIS — I5022 Chronic systolic (congestive) heart failure: Secondary | ICD-10-CM | POA: Diagnosis not present

## 2015-01-27 DIAGNOSIS — R069 Unspecified abnormalities of breathing: Secondary | ICD-10-CM | POA: Diagnosis not present

## 2015-01-27 DIAGNOSIS — I5022 Chronic systolic (congestive) heart failure: Secondary | ICD-10-CM | POA: Diagnosis not present

## 2015-01-27 DIAGNOSIS — M2550 Pain in unspecified joint: Secondary | ICD-10-CM | POA: Diagnosis not present

## 2015-02-01 DIAGNOSIS — J449 Chronic obstructive pulmonary disease, unspecified: Secondary | ICD-10-CM | POA: Diagnosis not present

## 2015-02-01 DIAGNOSIS — R609 Edema, unspecified: Secondary | ICD-10-CM | POA: Diagnosis not present

## 2015-02-07 ENCOUNTER — Ambulatory Visit (INDEPENDENT_AMBULATORY_CARE_PROVIDER_SITE_OTHER): Payer: Medicare Other | Admitting: Family Medicine

## 2015-02-07 ENCOUNTER — Encounter: Payer: Self-pay | Admitting: Family Medicine

## 2015-02-07 VITALS — BP 140/80 | HR 82 | Temp 98.1°F | Resp 22 | Wt 201.0 lb

## 2015-02-07 DIAGNOSIS — D638 Anemia in other chronic diseases classified elsewhere: Secondary | ICD-10-CM | POA: Diagnosis not present

## 2015-02-07 DIAGNOSIS — R1115 Cyclical vomiting syndrome unrelated to migraine: Secondary | ICD-10-CM

## 2015-02-07 DIAGNOSIS — D631 Anemia in chronic kidney disease: Secondary | ICD-10-CM | POA: Diagnosis not present

## 2015-02-07 DIAGNOSIS — G43A0 Cyclical vomiting, not intractable: Secondary | ICD-10-CM | POA: Diagnosis not present

## 2015-02-07 DIAGNOSIS — Z23 Encounter for immunization: Secondary | ICD-10-CM

## 2015-02-07 DIAGNOSIS — N189 Chronic kidney disease, unspecified: Secondary | ICD-10-CM | POA: Diagnosis not present

## 2015-02-07 DIAGNOSIS — N183 Chronic kidney disease, stage 3 (moderate): Secondary | ICD-10-CM | POA: Diagnosis not present

## 2015-02-07 DIAGNOSIS — R1314 Dysphagia, pharyngoesophageal phase: Secondary | ICD-10-CM

## 2015-02-07 MED ORDER — PANTOPRAZOLE SODIUM 40 MG PO TBEC: 40.0000 mg | DELAYED_RELEASE_TABLET | Freq: Every day | ORAL | Status: DC

## 2015-02-07 NOTE — Addendum Note (Signed)
Addended by: Shary Decamp B on: 02/07/2015 12:50 PM   Modules accepted: Orders

## 2015-02-07 NOTE — Progress Notes (Signed)
Subjective:    Patient ID: [name], female    DOB: [birth year], 81 y.o.   MRN: [id number]  HPI  01/03/15  patient is a 81 year old African-American female who states for the last 4 weeks she has been unable to tolerate solid food. She is able to eat soup and drink liquids without problems. She is able to eat soft foods such as applesauce or mashed potatoes without nausea or vomiting. However when she eats solid foods such as chicken or beef, she will develop nausea and vomiting within a few minutes of eating. Symptoms sound similar to a stricture although she denies food getting stuck in her throat. It certainly sounds like there is some element of obstruction or blockage. She denies any constipation. She denies any melanoma or hematochezia or hematemesis. Patient has congestive heart failure with an ejection fraction between 20 and 30% and due to her advanced age and history of tachybradycardia syndrome she is a high-risk patient for anesthesia. Therefore I'm hesitant to send the patient for an EGD unless we are certain what is causing her problem.  At that time, my plan was:  Patient is still having bowel movements. Abdomen is soft nondistended and nontender therefore I believe the small bowel obstruction is unlikely. I am concerned about a stricture versus  Some type of blockage in the stomach versus gastroparesis. Therefore I will treat with Reglan 10 mg by mouth every before meals and at bedtime and Protonix 40 mg by mouth daily. If symptoms are not improving over the weekend, I will schedule the patient for a barium swallow to evaluate for a stricture or  Mechanical obstruction in the esophagus/stomach.    02/07/15 Patient did not follow up again until today. However her symptoms have completely subsided. She reports no further nausea or vomiting. She denies any abdominal pain. She denies any dysphagia. She is due to recheck  Her hemoglobin stemming from her anemia of chronic disease and  chronic kidney disease. She is taking her iron pills. Past Medical History  Diagnosis Date  . Hypertension   . Obesity   . Depression   . Tachy-brady syndrome     a. 03/2006 s/p SJM 5356 Verity ADx XLDR DC PPM (ser#: [id number]).  . Gout   . Chronic systolic CHF (congestive heart failure)     a. 10/2013 Echo: EF 25-30%, diff HK, Gr 1 DD, triv AI, mild to mod MR, dev dil LA, mild RV dysfxn, mildly dil RA, mod TR.  . Cardiomyopathy     a. ? ischemic vs non-ischemic;  b. 09/2013 Echo: EF 25-30%.  Marland Kitchen COPD (chronic obstructive pulmonary disease)   . Glaucoma of both eyes   . GERD (gastroesophageal reflux disease)   . Presence of permanent cardiac pacemaker   . CKD (chronic kidney disease), stage III   . Kidney stones   . Arthritis     "knees" (07/23/2014)   Past Surgical History  Procedure Laterality Date  . Cardiac catheterization  10/2000    Archie Endo 10/10/2010  . Insert / replace / remove pacemaker  08/2000; 03/2006    Archie Endo 10/07/2010  . Cholecystectomy open    . Kidney stone surgery      "hospitalized for 19 days"  . Tubal ligation     Current Outpatient Prescriptions on File Prior to Visit  Medication Sig Dispense Refill  . acetaminophen (TYLENOL) 650 MG CR tablet Take 650 mg by mouth every 8 (eight) hours as needed for pain.    Marland Kitchen  albuterol (PROVENTIL) (2.5 MG/3ML) 0.083% nebulizer solution INHALE 1 VIAL VIA NEBULIZER EVERY 6 HOURS AS NEEDED FOR WHEEZING AND SHORTNESS OF BREATH 150 mL 5  . allopurinol (ZYLOPRIM) 100 MG tablet TAKE 2 TABLETS BY MOUTH ONCE DAILY 60 tablet 5  . amLODipine (NORVASC) 5 MG tablet Take 1 tablet (5 mg total) by mouth daily. 90 tablet 3  . aspirin 81 MG tablet Take 1 tablet (81 mg total) by mouth daily. 30 tablet 11  . calcium carbonate (TUMS - DOSED IN MG ELEMENTAL CALCIUM) 500 MG chewable tablet Chew 1 tablet by mouth daily.    . carvedilol (COREG) 25 MG tablet TAKE 1 TABLET BY MOUTH TWICE DAILY WITH A MEAL 60 tablet 10  . citalopram (CELEXA) 10 MG tablet  TAKE 1 TABLET BY MOUTH DAILY 30 tablet 5  . diclofenac sodium (VOLTAREN) 1 % GEL Apply 2 g topically 4 (four) times daily. 100 g 2  . DIGOX 125 MCG tablet TAKE ONE TABLET BY MOUTH DAILY 30 tablet 3  . FERROUS SULFATE PO Take 65 mg by mouth daily.    . furosemide (LASIX) 20 MG tablet Take 1 tablet (20 mg total) by mouth 2 (two) times daily. 60 tablet 3  . metoCLOPramide (REGLAN) 10 MG tablet Take 1 tablet (10 mg total) by mouth 4 (four) times daily. 120 tablet 0  . omeprazole (PRILOSEC) 40 MG capsule TAKE 1 CAPSULE BY MOUTH DAILY 30 capsule 11  . OXYGEN Inhale 2 L into the lungs continuous.    Marland Kitchen UNABLE TO FIND PORTABLE OXYGEN EVALUATION for SIMPLY TO GO MINI Pt on 2l/min continuous 1 each 0  . UNABLE TO FIND Deliver one GERI CHAIR 1 each 0  . UNABLE TO FIND Pt needs PULL UP incontinent underware.  Wt 202lbs 2 Container 11  . UNABLE TO FIND Pt needs blue chux underpads 2 Container 11   No current facility-administered medications on file prior to visit.   No Known Allergies Social History   Social History  . Marital Status: Widowed    Spouse Name: N/A  . Number of Children: N/A  . Years of Education: N/A   Occupational History  . Not on file.   Social History Main Topics  . Smoking status: Former Smoker    Types: Cigarettes  . Smokeless tobacco: Never Used     Comment: "never inhaled cigarettes; just lit them"  . Alcohol Use: Yes     Comment: 07/23/2014 "I used to drink beer; nothing in 3-4 years"  . Drug Use: No  . Sexual Activity: No   Other Topics Concern  . Not on file   Social History Narrative     Review of Systems  All other systems reviewed and are negative.      Objective:   Physical Exam  Cardiovascular: Normal rate, regular rhythm and normal heart sounds.   Pulmonary/Chest: Effort normal and breath sounds normal. No respiratory distress. She has no wheezes. She has no rales.  Abdominal: Soft. She exhibits no distension. Bowel sounds are decreased. There  is no tenderness. There is no rebound and no guarding.  Vitals reviewed.         Assessment & Plan:  Anemia in chronic kidney disease - Plan: Anemia panel  Non-intractable cyclical vomiting with nausea - Plan: pantoprazole (PROTONIX) 40 MG tablet  Dysphagia, pharyngoesophageal phase  Discontinue Reglan his symptoms have resolved. Continue Protonix. I will check a hemoglobin as well as a CMP to monitor her kidney function along with her  hemoglobin.

## 2015-02-08 LAB — ANEMIA PANEL
%SAT: 13 % (ref 11–50)
FERRITIN: 287 ng/mL (ref 10–291)
Folate: 4.5 ng/mL
Iron: 25 ug/dL — ABNORMAL LOW (ref 45–160)
TIBC: 198 ug/dL — ABNORMAL LOW (ref 250–450)
UIBC: 173 ug/dL (ref 125–400)
VITAMIN B 12: 467 pg/mL (ref 211–911)

## 2015-02-14 ENCOUNTER — Other Ambulatory Visit: Payer: Self-pay | Admitting: Family Medicine

## 2015-02-14 DIAGNOSIS — M1990 Unspecified osteoarthritis, unspecified site: Secondary | ICD-10-CM | POA: Diagnosis not present

## 2015-02-14 DIAGNOSIS — M6281 Muscle weakness (generalized): Secondary | ICD-10-CM | POA: Diagnosis not present

## 2015-02-15 ENCOUNTER — Other Ambulatory Visit: Payer: Self-pay | Admitting: *Deleted

## 2015-02-15 DIAGNOSIS — R1115 Cyclical vomiting syndrome unrelated to migraine: Secondary | ICD-10-CM

## 2015-02-15 MED ORDER — METOCLOPRAMIDE HCL 10 MG PO TABS: 10.0000 mg | ORAL_TABLET | Freq: Four times a day (QID) | ORAL | Status: DC

## 2015-02-15 NOTE — Telephone Encounter (Signed)
Received fax requesting refill on Reglan.   Refill appropriate and filled per protocol.  

## 2015-02-18 DIAGNOSIS — M6281 Muscle weakness (generalized): Secondary | ICD-10-CM | POA: Diagnosis not present

## 2015-02-18 DIAGNOSIS — M818 Other osteoporosis without current pathological fracture: Secondary | ICD-10-CM | POA: Diagnosis not present

## 2015-02-18 DIAGNOSIS — J449 Chronic obstructive pulmonary disease, unspecified: Secondary | ICD-10-CM | POA: Diagnosis not present

## 2015-02-18 DIAGNOSIS — I5022 Chronic systolic (congestive) heart failure: Secondary | ICD-10-CM | POA: Diagnosis not present

## 2015-02-18 DIAGNOSIS — R32 Unspecified urinary incontinence: Secondary | ICD-10-CM | POA: Diagnosis not present

## 2015-02-19 ENCOUNTER — Other Ambulatory Visit: Payer: Self-pay | Admitting: Family Medicine

## 2015-02-19 ENCOUNTER — Other Ambulatory Visit: Payer: Self-pay | Admitting: Internal Medicine

## 2015-02-19 ENCOUNTER — Telehealth: Payer: Self-pay | Admitting: Family Medicine

## 2015-02-19 MED ORDER — ENSURE ENLIVE PO LIQD: 1.0000 | ORAL | Status: DC

## 2015-02-19 NOTE — Telephone Encounter (Signed)
Pt states [name] told her to drink an Ensure once daily.  If order through pharmacy, insurance will help pay for.  Order sent.

## 2015-02-20 NOTE — Telephone Encounter (Signed)
Refill appropriate and filled per protocol. 

## 2015-02-26 ENCOUNTER — Encounter: Payer: Self-pay | Admitting: *Deleted

## 2015-02-26 DIAGNOSIS — M2550 Pain in unspecified joint: Secondary | ICD-10-CM | POA: Diagnosis not present

## 2015-02-26 DIAGNOSIS — I5022 Chronic systolic (congestive) heart failure: Secondary | ICD-10-CM | POA: Diagnosis not present

## 2015-03-03 DIAGNOSIS — J449 Chronic obstructive pulmonary disease, unspecified: Secondary | ICD-10-CM | POA: Diagnosis not present

## 2015-03-03 DIAGNOSIS — R609 Edema, unspecified: Secondary | ICD-10-CM | POA: Diagnosis not present

## 2015-03-06 NOTE — Progress Notes (Signed)
,      Patient Care Team: [name], MD as PCP - General (Family Medicine)   HPI  [name] is a 81 y.o. female Seen in followup for a pacemaker implanted originally in 2002 replaced by Dr. Bishop Limbo in 2007. 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 Jude device and he underwent lead revision also in 2007   She sees Dr. Dennard Schaumann for her blood pressure. She has had problems with swelling of her feet and DOE:  She recently fell and had a" articular surface contour abnormality along a femoral condyle,  potentially representing an osteochondral fracture"  No casting was done   Echocardiogram 2013 demonstrated normal left ventricular function; interval echocardiogram 8/15 demonstrated severe LV dysfunction with an EF of 25-30% with pulmonary hypertension. At this point Diovan and Lasix were added.  Past Medical History  Diagnosis Date  . Hypertension   . Obesity   . Depression   . Tachy-brady syndrome     a. 03/2006 s/p SJM 5356 Verity ADx XLDR DC PPM (ser#: [id number]).  . Gout   . Chronic systolic CHF (congestive heart failure)     a. 10/2013 Echo: EF 25-30%, diff HK, Gr 1 DD, triv AI, mild to mod MR, dev dil LA, mild RV dysfxn, mildly dil RA, mod TR.  . Cardiomyopathy     a. ? ischemic vs non-ischemic;  b. 09/2013 Echo: EF 25-30%.  Marland Kitchen COPD (chronic obstructive pulmonary disease)   . Glaucoma of both eyes   . GERD (gastroesophageal reflux disease)   . Presence of permanent cardiac pacemaker   . CKD (chronic kidney disease), stage III   . Kidney stones   . Arthritis     "knees" (07/23/2014)    Past Surgical History  Procedure Laterality Date  . Cardiac catheterization  10/2000    Archie Endo 10/10/2010  . Insert / replace / remove pacemaker  08/2000; 03/2006    Archie Endo 10/07/2010  . Cholecystectomy open    . Kidney stone surgery      "hospitalized for 19 days"  . Tubal ligation      Current Outpatient Prescriptions  Medication Sig Dispense Refill  . acetaminophen (TYLENOL) 650 MG CR tablet Take 650 mg by  mouth every 8 (eight) hours as needed for pain.    Marland Kitchen albuterol (PROVENTIL) (2.5 MG/3ML) 0.083% nebulizer solution INHALE 1 VIAL VIA NEBULIZER EVERY 6 HOURS AS NEEDED FOR WHEEZING AND SHORTNESS OF BREATH 150 mL 5  . allopurinol (ZYLOPRIM) 100 MG tablet TAKE 2 TABLETS BY MOUTH ONCE DAILY 60 tablet 11  . amLODipine (NORVASC) 5 MG tablet Take 1 tablet (5 mg total) by mouth daily. 90 tablet 3  . aspirin 81 MG tablet Take 1 tablet (81 mg total) by mouth daily. 30 tablet 11  . calcium carbonate (TUMS - DOSED IN MG ELEMENTAL CALCIUM) 500 MG chewable tablet Chew 1 tablet by mouth daily.    . carvedilol (COREG) 25 MG tablet TAKE 1 TABLET BY MOUTH TWICE DAILY WITH A MEAL 60 tablet 0  . citalopram (CELEXA) 10 MG tablet TAKE 1 TABLET BY MOUTH DAILY 30 tablet 5  . diclofenac sodium (VOLTAREN) 1 % GEL Apply 2 g topically 4 (four) times daily. 100 g 2  . DIGOX 125 MCG tablet TAKE ONE TABLET BY MOUTH DAILY 30 tablet 3  . feeding supplement, ENSURE ENLIVE, (ENSURE ENLIVE) LIQD Take 237 mLs by mouth daily. 7110 mL 11  . FERROUS SULFATE PO Take 65 mg by mouth daily.    . furosemide (  LASIX) 20 MG tablet TAKE ONE TABLET BY MOUTH TWO TIMES DAILY. 60 tablet 11  . metoCLOPramide (REGLAN) 10 MG tablet TAKE 1 TABLET BY MOUTH FOUR TIMES A DAY 120 tablet 6  . OXYGEN Inhale 2 L into the lungs continuous.    . pantoprazole (PROTONIX) 40 MG tablet Take 1 tablet (40 mg total) by mouth daily. 30 tablet 3  . UNABLE TO FIND PORTABLE OXYGEN EVALUATION for SIMPLY TO GO MINI Pt on 2l/min continuous 1 each 0  . UNABLE TO FIND Deliver one GERI CHAIR 1 each 0  . UNABLE TO FIND Pt needs PULL UP incontinent underware.  Wt 202lbs 2 Container 11  . UNABLE TO FIND Pt needs blue chux underpads 2 Container 11   No current facility-administered medications for this visit.    No Known Allergies  Review of Systems negative except from HPI and PMH  Physical Exam There were no vitals taken for this visit. Well developed and well nourished  in no acute distress HENT normal E scleral and icterus clear Neck Supple JVP flat; carotids brisk and full Clear to ausculation Device pocket well healed; without hematoma or erythema.  There is no tethering Regular rate and rhythm, no murmurs gallops or rub Soft with active bowel sounds No clubbing cyanosis 1+ Edema Alert and oriented, grossly normal motor and sensory function Skin Warm and Dry  ecg    Assessment and  Plan  Pacemaker-St. Jude The patient's device was interrogated.  The information was reviewed. No changes were made in the programming.     HFrEF  Cardiomyopathy  Hypertension   P.

## 2015-03-08 ENCOUNTER — Encounter: Payer: Medicare Other | Admitting: Internal Medicine

## 2015-03-10 ENCOUNTER — Encounter: Payer: Self-pay | Admitting: Nurse Practitioner

## 2015-03-10 NOTE — Progress Notes (Signed)
Electrophysiology Office Note Date: 03/11/2015  ID:  [name], DOB 1923-01-04, MRN [id number]  PCP: Odette Fraction, MD Electrophysiologist: Caryl Comes  CC: Pacemaker follow-up  [name] is a 81 y.o. female seen today for Dr Caryl Comes.  She presents today for routine electrophysiology followup.  Since last being seen in our clinic, the patient reports doing very well.  She denies chest pain, palpitations, PND, orthopnea, nausea, vomiting, dizziness, syncope, edema, weight gain, or early satiety.  She is fairly sedentary at home and lives with her granddaughter who accompanies her to her visit today.  She is wearing O2 all the time at home and has chronic dyspnea on exertion.   Last echo 07/2014 demonstrated EF [id number], grade 1 diastolic dysunction, basal to mid inferior hypokinesis, PA pressure 42, LA 48  Device History: STJ dual chamber PPM implanted 2002 for tachy-brady syndrome (SVT); gen change 2007 with RA lead revision   Past Medical History  Diagnosis Date  . Hypertension   . Obesity   . Depression   . Tachy-brady syndrome ([place])     a. 03/2006 s/p SJM 5356 Verity ADx XLDR DC PPM (ser#: [id number]).  . Gout   . Chronic systolic CHF (congestive heart failure) ([place])     a. 10/2013 Echo: EF 25-30%, diff HK, Gr 1 DD, triv AI, mild to mod MR, dev dil LA, mild RV dysfxn, mildly dil RA, mod TR.  . Cardiomyopathy ([place])     a. ? ischemic vs non-ischemic;  b. 09/2013 Echo: EF 25-30%.  Marland Kitchen COPD (chronic obstructive pulmonary disease) ([place])   . Glaucoma of both eyes   . GERD (gastroesophageal reflux disease)   . CKD (chronic kidney disease), stage III   . Kidney stones   . Arthritis    Past Surgical History  Procedure Laterality Date  . Cardiac catheterization  10/2000    Archie Endo 10/10/2010  . Insert / replace / remove pacemaker  08/2000; 03/2006    Archie Endo 10/07/2010  . Cholecystectomy open    . Kidney stone surgery      "hospitalized for 19 days"  . Tubal ligation      Current  Outpatient Prescriptions  Medication Sig Dispense Refill  . acetaminophen (TYLENOL) 650 MG CR tablet Take 650 mg by mouth every 8 (eight) hours as needed for pain.    Marland Kitchen albuterol (PROVENTIL) (2.5 MG/3ML) 0.083% nebulizer solution INHALE 1 VIAL VIA NEBULIZER EVERY 6 HOURS AS NEEDED FOR WHEEZING AND SHORTNESS OF BREATH 150 mL 5  . allopurinol (ZYLOPRIM) 100 MG tablet TAKE 2 TABLETS BY MOUTH ONCE DAILY 60 tablet 11  . amLODipine (NORVASC) 5 MG tablet Take 1 tablet (5 mg total) by mouth daily. 90 tablet 3  . aspirin 81 MG tablet Take 1 tablet (81 mg total) by mouth daily. 30 tablet 11  . calcium carbonate (TUMS - DOSED IN MG ELEMENTAL CALCIUM) 500 MG chewable tablet Chew 1 tablet by mouth daily.    . carvedilol (COREG) 25 MG tablet TAKE 1 TABLET BY MOUTH TWICE DAILY WITH A MEAL 60 tablet 0  . citalopram (CELEXA) 10 MG tablet TAKE 1 TABLET BY MOUTH DAILY 30 tablet 5  . diclofenac sodium (VOLTAREN) 1 % GEL Apply 2 g topically 4 (four) times daily. 100 g 2  . feeding supplement, ENSURE ENLIVE, (ENSURE ENLIVE) LIQD Take 237 mLs by mouth daily. 7110 mL 11  . FERROUS SULFATE PO Take 65 mg by mouth daily.    . furosemide (LASIX) 20 MG tablet TAKE  ONE TABLET BY MOUTH TWO TIMES DAILY. 60 tablet 11  . OXYGEN Inhale 2 L into the lungs continuous.    . pantoprazole (PROTONIX) 40 MG tablet Take 1 tablet (40 mg total) by mouth daily. 30 tablet 3  . UNABLE TO FIND Deliver one GERI CHAIR 1 each 0  . UNABLE TO FIND Pt needs PULL UP incontinent underware.  Wt 202lbs 2 Container 11  . UNABLE TO FIND Pt needs blue chux underpads 2 Container 11  . [place] 125 MCG tablet TAKE ONE TABLET BY MOUTH DAILY (Patient not taking: Reported on 03/11/2015) 30 tablet 3   No current facility-administered medications for this visit.    Allergies:   Review of patient's allergies indicates no known allergies.   Social History: Social History   Social History  . Marital Status: Widowed    Spouse Name: N/A  . Number of Children:  N/A  . Years of Education: N/A   Occupational History  . Not on file.   Social History Main Topics  . Smoking status: Former Smoker    Types: Cigarettes  . Smokeless tobacco: Never Used     Comment: "never inhaled cigarettes; just lit them"  . Alcohol Use: Yes     Comment: 07/23/2014 "I used to drink beer; nothing in 3-4 years"  . Drug Use: No  . Sexual Activity: No   Other Topics Concern  . Not on file   Social History Narrative    Family History: Family History  Problem Relation Age of Onset  . Heart attack Neg Hx   . Stroke Neg Hx   . Hypertension Mother      Review of Systems: All other systems reviewed and are otherwise negative except as noted above.   Physical Exam: VS:  BP 132/80 mmHg  Pulse 68  Ht 5' 1.5" (1.562 m)  Wt 208 lb (94.348 kg)  BMI 38.67 kg/m2 , BMI Body mass index is 38.67 kg/(m^2).  GEN- The patient is elderly and obese appearing, alert and oriented x 3 today.   HEENT: normocephalic, atraumatic; sclera clear, conjunctiva pink; hearing intact; oropharynx clear; neck supple, no JVP Lymph- no cervical lymphadenopathy Lungs- Clear to ausculation bilaterally, normal work of breathing.  No wheezes, rales, rhonchi Heart- Regular rate and rhythm, no murmurs, rubs or gallops, PMI not laterally displaced GI- soft, non-tender, non-distended, bowel sounds present, no hepatosplenomegaly Extremities- no clubbing, cyanosis, or edema; DP/PT/radial pulses 2+ bilaterally MS- no significant deformity or atrophy Skin- warm and dry, no rash or lesion; PPM pocket well healed Psych- euthymic mood, full affect Neuro- strength and sensation are intact  PPM Interrogation- reviewed in detail today,  See PACEART report  EKG:  EKG is not ordered today.  Recent Labs: 07/23/2014: B Natriuretic Peptide 1275.8* 07/25/2014: TSH 1.024 11/12/2014: ALT <8; BUN 47*; Creat 1.81*; Hemoglobin 9.8*; Platelets 161; Potassium 4.6; Sodium 140   Wt Readings from Last 3 Encounters:    03/11/15 208 lb (94.348 kg)  02/07/15 201 lb (91.173 kg)  01/03/15 202 lb (91.627 kg)     Other studies Reviewed: Additional studies/ records that were reviewed today include: Dr Olin Pia notes, last echo  Assessment and Plan:  1.  Tachy/brady syndrome for apparent SVT  Normal PPM function See Pace Art report No changes today  2.  SVT Mode switched <1% by device interrogation today All episodes < 5 minutes EGM's not available   3.  Chronic combined systolic and diastolic heart failure Euvolemic on exam Continue medical therapy  Current medicines are reviewed at length with the patient today.   The patient does not have concerns regarding her medicines.  The following changes were made today:  none  Labs/ tests ordered today include:  Orders Placed This Encounter  Procedures  . EKG 12-Lead     Disposition:   Follow up with device clinic in 6 months, Dr Caryl Comes in 1 year   Signed, Chanetta Marshall, NP 03/11/2015 10:13 AM  [place] [address] [phone] (office) [phone] (fax)

## 2015-03-11 ENCOUNTER — Encounter: Payer: Self-pay | Admitting: Nurse Practitioner

## 2015-03-11 ENCOUNTER — Ambulatory Visit (INDEPENDENT_AMBULATORY_CARE_PROVIDER_SITE_OTHER): Payer: Medicare Other | Admitting: Nurse Practitioner

## 2015-03-11 VITALS — BP 132/80 | HR 68 | Ht 61.5 in | Wt 208.0 lb

## 2015-03-11 DIAGNOSIS — I5042 Chronic combined systolic (congestive) and diastolic (congestive) heart failure: Secondary | ICD-10-CM | POA: Diagnosis not present

## 2015-03-11 DIAGNOSIS — I471 Supraventricular tachycardia: Secondary | ICD-10-CM

## 2015-03-11 DIAGNOSIS — I495 Sick sinus syndrome: Secondary | ICD-10-CM | POA: Diagnosis not present

## 2015-03-11 NOTE — Patient Instructions (Signed)
Medication Instructions: continue on your current medications as directed. Please refer to the Current Medication list given to you today.    Labwork: NONE ORDER TODAY    Testing/Procedures: NONE ORDER TODAY    Follow-Up:  Your physician wants you to follow-up in:  IN  [place] will receive a reminder letter in the mail two months in advance. If you don't receive a letter, please call our office to schedule the follow-up appointment.   Your physician wants you to follow-up in: [place] will receive a reminder letter in the mail two months in advance. If you don't receive a letter, please call our office to schedule the follow-up appointment.         Any Other Special Instructions Will Be Listed Below (If Applicable).

## 2015-03-16 DIAGNOSIS — M1990 Unspecified osteoarthritis, unspecified site: Secondary | ICD-10-CM | POA: Diagnosis not present

## 2015-03-16 DIAGNOSIS — M6281 Muscle weakness (generalized): Secondary | ICD-10-CM | POA: Diagnosis not present

## 2015-03-18 LAB — CUP PACEART INCLINIC DEVICE CHECK
Implantable Lead Implant Date: 19931022
Implantable Lead Implant Date: 20071115
Implantable Lead Location: 753860
MDC IDC LEAD LOCATION: 753859
MDC IDC PG SERIAL: 1124188
MDC IDC SESS DTM: 20161024131712

## 2015-03-20 ENCOUNTER — Other Ambulatory Visit: Payer: Self-pay | Admitting: Family Medicine

## 2015-03-20 NOTE — Telephone Encounter (Signed)
Medication refilled per protocol. 

## 2015-03-29 DIAGNOSIS — M2550 Pain in unspecified joint: Secondary | ICD-10-CM | POA: Diagnosis not present

## 2015-03-29 DIAGNOSIS — I5022 Chronic systolic (congestive) heart failure: Secondary | ICD-10-CM | POA: Diagnosis not present

## 2015-04-03 DIAGNOSIS — J449 Chronic obstructive pulmonary disease, unspecified: Secondary | ICD-10-CM | POA: Diagnosis not present

## 2015-04-03 DIAGNOSIS — R609 Edema, unspecified: Secondary | ICD-10-CM | POA: Diagnosis not present

## 2015-04-16 DIAGNOSIS — M6281 Muscle weakness (generalized): Secondary | ICD-10-CM | POA: Diagnosis not present

## 2015-04-16 DIAGNOSIS — M1990 Unspecified osteoarthritis, unspecified site: Secondary | ICD-10-CM | POA: Diagnosis not present

## 2015-04-23 ENCOUNTER — Other Ambulatory Visit: Payer: Self-pay | Admitting: *Deleted

## 2015-04-23 MED ORDER — CARVEDILOL 25 MG PO TABS: ORAL_TABLET | ORAL | Status: DC

## 2015-04-28 DIAGNOSIS — M2550 Pain in unspecified joint: Secondary | ICD-10-CM | POA: Diagnosis not present

## 2015-04-28 DIAGNOSIS — I5022 Chronic systolic (congestive) heart failure: Secondary | ICD-10-CM | POA: Diagnosis not present

## 2015-05-01 ENCOUNTER — Encounter: Payer: Self-pay | Admitting: Internal Medicine

## 2015-05-03 DIAGNOSIS — R609 Edema, unspecified: Secondary | ICD-10-CM | POA: Diagnosis not present

## 2015-05-03 DIAGNOSIS — J449 Chronic obstructive pulmonary disease, unspecified: Secondary | ICD-10-CM | POA: Diagnosis not present

## 2015-05-16 DIAGNOSIS — M1990 Unspecified osteoarthritis, unspecified site: Secondary | ICD-10-CM | POA: Diagnosis not present

## 2015-05-16 DIAGNOSIS — M6281 Muscle weakness (generalized): Secondary | ICD-10-CM | POA: Diagnosis not present

## 2015-05-22 ENCOUNTER — Other Ambulatory Visit: Payer: Self-pay | Admitting: Family Medicine

## 2015-05-29 DIAGNOSIS — I5022 Chronic systolic (congestive) heart failure: Secondary | ICD-10-CM | POA: Diagnosis not present

## 2015-05-29 DIAGNOSIS — M2550 Pain in unspecified joint: Secondary | ICD-10-CM | POA: Diagnosis not present

## 2015-06-14 DIAGNOSIS — M818 Other osteoporosis without current pathological fracture: Secondary | ICD-10-CM | POA: Diagnosis not present

## 2015-06-14 DIAGNOSIS — J449 Chronic obstructive pulmonary disease, unspecified: Secondary | ICD-10-CM | POA: Diagnosis not present

## 2015-06-14 DIAGNOSIS — R32 Unspecified urinary incontinence: Secondary | ICD-10-CM | POA: Diagnosis not present

## 2015-06-14 DIAGNOSIS — M6281 Muscle weakness (generalized): Secondary | ICD-10-CM | POA: Diagnosis not present

## 2015-06-14 DIAGNOSIS — I5022 Chronic systolic (congestive) heart failure: Secondary | ICD-10-CM | POA: Diagnosis not present

## 2015-06-16 DIAGNOSIS — M6281 Muscle weakness (generalized): Secondary | ICD-10-CM | POA: Diagnosis not present

## 2015-06-16 DIAGNOSIS — M1990 Unspecified osteoarthritis, unspecified site: Secondary | ICD-10-CM | POA: Diagnosis not present

## 2015-06-17 ENCOUNTER — Other Ambulatory Visit: Payer: Self-pay | Admitting: Family Medicine

## 2015-06-29 DIAGNOSIS — M2550 Pain in unspecified joint: Secondary | ICD-10-CM | POA: Diagnosis not present

## 2015-06-29 DIAGNOSIS — I5022 Chronic systolic (congestive) heart failure: Secondary | ICD-10-CM | POA: Diagnosis not present

## 2015-07-02 ENCOUNTER — Telehealth: Payer: Self-pay | Admitting: Family Medicine

## 2015-07-02 ENCOUNTER — Other Ambulatory Visit: Payer: Self-pay | Admitting: Family Medicine

## 2015-07-02 MED ORDER — CARVEDILOL 25 MG PO TABS: ORAL_TABLET | ORAL | Status: DC

## 2015-07-02 NOTE — Telephone Encounter (Signed)
Pt is in need of a refill of her Coreg 25mg . She uses [facility] Pt contact: [name] @ [phone]

## 2015-07-02 NOTE — Telephone Encounter (Signed)
Medication refilled per protocol. 

## 2015-07-03 MED ORDER — CARVEDILOL 25 MG PO TABS: ORAL_TABLET | ORAL | Status: DC

## 2015-07-03 NOTE — Telephone Encounter (Signed)
Medication called/sent to requested pharmacy  

## 2015-07-17 DIAGNOSIS — M1990 Unspecified osteoarthritis, unspecified site: Secondary | ICD-10-CM | POA: Diagnosis not present

## 2015-07-17 DIAGNOSIS — M6281 Muscle weakness (generalized): Secondary | ICD-10-CM | POA: Diagnosis not present

## 2015-07-24 ENCOUNTER — Telehealth: Payer: Self-pay | Admitting: Internal Medicine

## 2015-07-24 NOTE — Telephone Encounter (Signed)
She wants to confirm that pt does have congested heart failure. You can call or fax-Fax#-[phone].

## 2015-07-24 NOTE — Telephone Encounter (Signed)
Returned call to [place] at [place] [phone] x [phone] Left message on her confidential voice mail: EF 07/2014 45-50% 07/23/14 Diagnosis of CHF NYHA Class III BP 03/11/2015 132/80

## 2015-07-27 DIAGNOSIS — M2550 Pain in unspecified joint: Secondary | ICD-10-CM | POA: Diagnosis not present

## 2015-07-27 DIAGNOSIS — I5022 Chronic systolic (congestive) heart failure: Secondary | ICD-10-CM | POA: Diagnosis not present

## 2015-07-31 DIAGNOSIS — M6281 Muscle weakness (generalized): Secondary | ICD-10-CM | POA: Diagnosis not present

## 2015-07-31 DIAGNOSIS — J449 Chronic obstructive pulmonary disease, unspecified: Secondary | ICD-10-CM | POA: Diagnosis not present

## 2015-07-31 DIAGNOSIS — M818 Other osteoporosis without current pathological fracture: Secondary | ICD-10-CM | POA: Diagnosis not present

## 2015-07-31 DIAGNOSIS — I5022 Chronic systolic (congestive) heart failure: Secondary | ICD-10-CM | POA: Diagnosis not present

## 2015-07-31 DIAGNOSIS — R32 Unspecified urinary incontinence: Secondary | ICD-10-CM | POA: Diagnosis not present

## 2015-08-14 DIAGNOSIS — M6281 Muscle weakness (generalized): Secondary | ICD-10-CM | POA: Diagnosis not present

## 2015-08-14 DIAGNOSIS — M1990 Unspecified osteoarthritis, unspecified site: Secondary | ICD-10-CM | POA: Diagnosis not present

## 2015-08-16 ENCOUNTER — Other Ambulatory Visit: Payer: Self-pay | Admitting: Family Medicine

## 2015-08-19 NOTE — Telephone Encounter (Signed)
Refill appropriate and filled per protocol. 

## 2015-08-27 DIAGNOSIS — M2550 Pain in unspecified joint: Secondary | ICD-10-CM | POA: Diagnosis not present

## 2015-08-27 DIAGNOSIS — I5022 Chronic systolic (congestive) heart failure: Secondary | ICD-10-CM | POA: Diagnosis not present

## 2015-09-03 ENCOUNTER — Ambulatory Visit (INDEPENDENT_AMBULATORY_CARE_PROVIDER_SITE_OTHER): Payer: Medicare Other | Admitting: Family Medicine

## 2015-09-03 ENCOUNTER — Encounter: Payer: Self-pay | Admitting: Family Medicine

## 2015-09-03 VITALS — BP 100/64 | HR 76 | Temp 97.6°F | Resp 20 | Ht 61.5 in | Wt 215.0 lb

## 2015-09-03 DIAGNOSIS — I5020 Unspecified systolic (congestive) heart failure: Secondary | ICD-10-CM

## 2015-09-03 DIAGNOSIS — N189 Chronic kidney disease, unspecified: Secondary | ICD-10-CM

## 2015-09-03 DIAGNOSIS — I10 Essential (primary) hypertension: Secondary | ICD-10-CM

## 2015-09-03 DIAGNOSIS — R1115 Cyclical vomiting syndrome unrelated to migraine: Secondary | ICD-10-CM

## 2015-09-03 DIAGNOSIS — M6281 Muscle weakness (generalized): Secondary | ICD-10-CM | POA: Diagnosis not present

## 2015-09-03 DIAGNOSIS — G43A0 Cyclical vomiting, not intractable: Secondary | ICD-10-CM

## 2015-09-03 DIAGNOSIS — R32 Unspecified urinary incontinence: Secondary | ICD-10-CM | POA: Diagnosis not present

## 2015-09-03 DIAGNOSIS — J449 Chronic obstructive pulmonary disease, unspecified: Secondary | ICD-10-CM | POA: Diagnosis not present

## 2015-09-03 DIAGNOSIS — I5022 Chronic systolic (congestive) heart failure: Secondary | ICD-10-CM | POA: Diagnosis not present

## 2015-09-03 DIAGNOSIS — R110 Nausea: Secondary | ICD-10-CM

## 2015-09-03 DIAGNOSIS — D631 Anemia in chronic kidney disease: Secondary | ICD-10-CM

## 2015-09-03 DIAGNOSIS — M818 Other osteoporosis without current pathological fracture: Secondary | ICD-10-CM | POA: Diagnosis not present

## 2015-09-03 LAB — COMPLETE METABOLIC PANEL WITH GFR
ALBUMIN: 3.4 g/dL — AB (ref 3.6–5.1)
ALK PHOS: 87 U/L (ref 33–130)
ALT: 4 U/L — AB (ref 6–29)
AST: 12 U/L (ref 10–35)
BILIRUBIN TOTAL: 0.5 mg/dL (ref 0.2–1.2)
BUN: 43 mg/dL — ABNORMAL HIGH (ref 7–25)
CALCIUM: 8.6 mg/dL (ref 8.6–10.4)
CO2: 31 mmol/L (ref 20–31)
CREATININE: 2.01 mg/dL — AB (ref 0.60–0.88)
Chloride: 103 mmol/L (ref 98–110)
GFR, EST AFRICAN AMERICAN: 25 mL/min — AB (ref >=60)
GFR, Est Non African American: 21 mL/min — ABNORMAL LOW (ref >=60)
Glucose, Bld: 60 mg/dL — ABNORMAL LOW (ref 70–99)
Potassium: 4.5 mmol/L (ref 3.5–5.3)
Sodium: 142 mmol/L (ref 135–146)
TOTAL PROTEIN: 7.1 g/dL (ref 6.1–8.1)

## 2015-09-03 LAB — CBC WITH DIFFERENTIAL/PLATELET
BASOS PCT: 1 %
Basophils Absolute: 65 cells/uL (ref 0–200)
Eosinophils Absolute: 585 cells/uL — ABNORMAL HIGH (ref 15–500)
Eosinophils Relative: 9 %
HEMATOCRIT: 33.6 % — AB (ref 35.0–45.0)
Hemoglobin: 10.6 g/dL — ABNORMAL LOW (ref 12.0–15.0)
LYMPHS ABS: 2405 {cells}/uL (ref 850–3900)
LYMPHS PCT: 37 %
MCH: 29.5 pg (ref 27.0–33.0)
MCHC: 31.5 g/dL — ABNORMAL LOW (ref 32.0–36.0)
MCV: 93.6 fL (ref 80.0–100.0)
MONO ABS: 650 {cells}/uL (ref 200–950)
MPV: 11.3 fL (ref 7.5–12.5)
Monocytes Relative: 10 %
Neutro Abs: 2795 cells/uL (ref 1500–7800)
Neutrophils Relative %: 43 %
Platelets: 196 10*3/uL (ref 140–400)
RBC: 3.59 MIL/uL — AB (ref 3.80–5.10)
RDW: 14.7 % (ref 11.0–15.0)
WBC: 6.5 10*3/uL (ref 3.8–10.8)

## 2015-09-03 MED ORDER — METOCLOPRAMIDE HCL 5 MG PO TABS: 5.0000 mg | ORAL_TABLET | Freq: Three times a day (TID) | ORAL | Status: DC

## 2015-09-03 NOTE — Progress Notes (Signed)
Subjective:    Patient ID: [name], female    DOB: 1922-08-16, 82 y.o.   MRN: [id number]  HPI  01/03/15  patient is a 82 year old African-American female who states for the last 4 weeks she has been unable to tolerate solid food. She is able to eat soup and drink liquids without problems. She is able to eat soft foods such as applesauce or mashed potatoes without nausea or vomiting. However when she eats solid foods such as chicken or beef, she will develop nausea and vomiting within a few minutes of eating. Symptoms sound similar to a stricture although she denies food getting stuck in her throat. It certainly sounds like there is some element of obstruction or blockage. She denies any constipation. She denies any melanoma or hematochezia or hematemesis. Patient has congestive heart failure with an ejection fraction between 20 and 30% and due to her advanced age and history of tachybradycardia syndrome she is a high-risk patient for anesthesia. Therefore I'm hesitant to send the patient for an EGD unless we are certain what is causing her problem.  At that time, my plan was:  Patient is still having bowel movements. Abdomen is soft nondistended and nontender therefore I believe the small bowel obstruction is unlikely. I am concerned about a stricture versus  Some type of blockage in the stomach versus gastroparesis. Therefore I will treat with Reglan 10 mg by mouth every before meals and at bedtime and Protonix 40 mg by mouth daily. If symptoms are not improving over the weekend, I will schedule the patient for a barium swallow to evaluate for a stricture or  Mechanical obstruction in the esophagus/stomach.    02/07/15 Patient did not follow up again until today. However her symptoms have completely subsided. She reports no further nausea or vomiting. She denies any abdominal pain. She denies any dysphagia. She is due to recheck  Her hemoglobin stemming from her anemia of chronic disease and  chronic kidney disease. She is taking her iron pills.  At that time, my plan was: Discontinue Reglan his symptoms have resolved. Continue Protonix. I will check a hemoglobin as well as a CMP to monitor her kidney function along with her hemoglobin.  09/03/15 Patient is here today for follow-up. She will turn 82 years old later this month. She states that for the last 2-3 months, she has developed nausea immediately after eating.  She is asking to go back on the Reglan. This seemed to work well for her. In August of last year we perform no workup to determine whether there was mechanical blockage versus gastroparesis. When her symptoms improved on Reglan the decision was made not to workup the situation further because of her advanced age and her multiple medical comorbidities. In that time she has gained 14 pounds. She denies any blood in her stool. However the nausea has returned. She denies any chest pain. She denies any new shortness of breath although she is on chronic oxygen. She denies any orthopnea or paroxysmal nocturnal dyspnea. She has a history of congestive heart failure with an ejection fraction of 25-30%.  She is on carvedilol. She is on Lasix. She is not on any angiotensin receptor blocker/ACE inhibitor. We have been avoiding this due to her chronic kidney disease, and her low blood pressure. She denies any palpitations. He also has a history of anemia of chronic disease. She is taking iron. She is overdue for lab work she is not fasting Past Medical History  Diagnosis  Date  . Hypertension   . Obesity   . Depression   . Tachy-brady syndrome ([place])     a. 03/2006 s/p SJM 5356 Verity ADx XLDR DC PPM (ser#: [id number]).  . Gout   . Chronic systolic CHF (congestive heart failure) ([place])     a. 10/2013 Echo: EF 25-30%, diff HK, Gr 1 DD, triv AI, mild to mod MR, dev dil LA, mild RV dysfxn, mildly dil RA, mod TR.  . Cardiomyopathy ([place])     a. ? ischemic vs non-ischemic;  b. 09/2013 Echo: EF 25-30%.    Marland Kitchen COPD (chronic obstructive pulmonary disease) ([place])   . Glaucoma of both eyes   . GERD (gastroesophageal reflux disease)   . CKD (chronic kidney disease), stage III   . Kidney stones   . Arthritis    Past Surgical History  Procedure Laterality Date  . Cardiac catheterization  10/2000    Archie Endo 10/10/2010  . Insert / replace / remove pacemaker  08/2000; 03/2006    Archie Endo 10/07/2010  . Cholecystectomy open    . Kidney stone surgery      "hospitalized for 19 days"  . Tubal ligation     Current Outpatient Prescriptions on File Prior to Visit  Medication Sig Dispense Refill  . acetaminophen (TYLENOL) 650 MG CR tablet Take 650 mg by mouth every 8 (eight) hours as needed for pain.    Marland Kitchen albuterol (PROVENTIL) (2.5 MG/3ML) 0.083% nebulizer solution INHALE 1 VIAL VIA NEBULIZER EVERY 6 HOURS AS NEEDED FOR WHEEZING AND SHORTNESS OF BREATH 150 mL 4  . allopurinol (ZYLOPRIM) 100 MG tablet TAKE 2 TABLETS BY MOUTH ONCE DAILY 60 tablet 4  . amLODipine (NORVASC) 5 MG tablet Take 1 tablet (5 mg total) by mouth daily. 90 tablet 3  . aspirin 81 MG tablet Take 1 tablet (81 mg total) by mouth daily. 30 tablet 11  . calcium carbonate (TUMS - DOSED IN MG ELEMENTAL CALCIUM) 500 MG chewable tablet Chew 1 tablet by mouth daily.    . carvedilol (COREG) 25 MG tablet TAKE 1 TABLET BY MOUTH TWICE DAILY WITH A MEAL 60 tablet 11  . citalopram (CELEXA) 10 MG tablet TAKE 1 TABLET BY MOUTH DAILY 30 tablet 11  . diclofenac sodium (VOLTAREN) 1 % GEL Apply 2 g topically 4 (four) times daily. 100 g 2  . DIGOX 125 MCG tablet TAKE ONE TABLET BY MOUTH DAILY 30 tablet 3  . feeding supplement, ENSURE ENLIVE, (ENSURE ENLIVE) LIQD Take 237 mLs by mouth daily. 7110 mL 11  . FERROUS SULFATE PO Take 65 mg by mouth daily.    . furosemide (LASIX) 20 MG tablet TAKE ONE TABLET BY MOUTH TWO TIMES DAILY. 60 tablet 4  . metoCLOPramide (REGLAN) 10 MG tablet TAKE 1 TABLET BY MOUTH FOUR TIMES A DAY 120 tablet 4  . OXYGEN Inhale 2 L into the  lungs continuous.    . pantoprazole (PROTONIX) 40 MG tablet TAKE 1 TABLET BY MOUTH EVERY DAY 30 tablet 11  . UNABLE TO FIND Deliver one GERI CHAIR 1 each 0  . UNABLE TO FIND Pt needs PULL UP incontinent underware.  Wt 202lbs 2 Container 11  . UNABLE TO FIND Pt needs blue chux underpads 2 Container 11   No current facility-administered medications on file prior to visit.   No Known Allergies Social History   Social History  . Marital Status: Widowed    Spouse Name: N/A  . Number of Children: N/A  . Years of Education: N/A  Occupational History  . Not on file.   Social History Main Topics  . Smoking status: Former Smoker    Types: Cigarettes  . Smokeless tobacco: Never Used     Comment: "never inhaled cigarettes; just lit them"  . Alcohol Use: Yes     Comment: 07/23/2014 "I used to drink beer; nothing in 3-4 years"  . Drug Use: No  . Sexual Activity: No   Other Topics Concern  . Not on file   Social History Narrative     Review of Systems  All other systems reviewed and are negative.      Objective:   Physical Exam  Cardiovascular: Normal rate, regular rhythm and normal heart sounds.   Pulmonary/Chest: Effort normal and breath sounds normal. No respiratory distress. She has no wheezes. She has no rales.  Abdominal: Soft. She exhibits no distension. Bowel sounds are decreased. There is no tenderness. There is no rebound and no guarding.  Vitals reviewed.         Assessment & Plan:  Nausea without vomiting - Plan: metoCLOPramide (REGLAN) 5 MG tablet  Anemia in chronic kidney disease - Plan: CBC with Differential/Platelet  Non-intractable cyclical vomiting with nausea  Systolic congestive heart failure, unspecified congestive heart failure chronicity ([place]) - Plan: CBC with Differential/Platelet, COMPLETE METABOLIC PANEL WITH GFR  Essential hypertension  Her blood pressure is too low. I do not believe she could tolerate an angiotensin receptor blocker right  now for her congestive heart failure. I will monitor her anemia with a CBC. Consider transfusion if hemoglobin is below 9. She is taking iron. There is no evidence of fluid overload. Even though her weight has gone up 14 pounds she has no peripheral edema. There is no JVD. Her lungs are clear. I'm concerned regarding her nausea after meals. I have recommended an EGD versus a barium swallow. However given her advanced age, she would like to just try the medicine and monitor the symptoms. I believe this is reasonable as she would be a poor surgical candidate. Should her symptoms not improve on Reglan, I recommended further workup. Both the patient and the family are okay with this plan

## 2015-09-14 DIAGNOSIS — M1990 Unspecified osteoarthritis, unspecified site: Secondary | ICD-10-CM | POA: Diagnosis not present

## 2015-09-14 DIAGNOSIS — M6281 Muscle weakness (generalized): Secondary | ICD-10-CM | POA: Diagnosis not present

## 2015-09-26 DIAGNOSIS — I5022 Chronic systolic (congestive) heart failure: Secondary | ICD-10-CM | POA: Diagnosis not present

## 2015-09-26 DIAGNOSIS — M2550 Pain in unspecified joint: Secondary | ICD-10-CM | POA: Diagnosis not present

## 2015-10-14 DIAGNOSIS — M6281 Muscle weakness (generalized): Secondary | ICD-10-CM | POA: Diagnosis not present

## 2015-10-14 DIAGNOSIS — M1990 Unspecified osteoarthritis, unspecified site: Secondary | ICD-10-CM | POA: Diagnosis not present

## 2015-10-27 DIAGNOSIS — M2550 Pain in unspecified joint: Secondary | ICD-10-CM | POA: Diagnosis not present

## 2015-10-27 DIAGNOSIS — I5022 Chronic systolic (congestive) heart failure: Secondary | ICD-10-CM | POA: Diagnosis not present

## 2015-10-29 ENCOUNTER — Other Ambulatory Visit: Payer: Self-pay | Admitting: Family Medicine

## 2015-11-12 DIAGNOSIS — I5022 Chronic systolic (congestive) heart failure: Secondary | ICD-10-CM | POA: Diagnosis not present

## 2015-11-12 DIAGNOSIS — R32 Unspecified urinary incontinence: Secondary | ICD-10-CM | POA: Diagnosis not present

## 2015-11-12 DIAGNOSIS — M818 Other osteoporosis without current pathological fracture: Secondary | ICD-10-CM | POA: Diagnosis not present

## 2015-11-12 DIAGNOSIS — J449 Chronic obstructive pulmonary disease, unspecified: Secondary | ICD-10-CM | POA: Diagnosis not present

## 2015-11-12 DIAGNOSIS — M6281 Muscle weakness (generalized): Secondary | ICD-10-CM | POA: Diagnosis not present

## 2015-11-19 ENCOUNTER — Ambulatory Visit: Payer: Medicare Other | Admitting: Family Medicine

## 2015-11-20 ENCOUNTER — Other Ambulatory Visit: Payer: Self-pay | Admitting: Family Medicine

## 2015-11-25 ENCOUNTER — Other Ambulatory Visit: Payer: Self-pay | Admitting: Family Medicine

## 2015-11-26 DIAGNOSIS — I5022 Chronic systolic (congestive) heart failure: Secondary | ICD-10-CM | POA: Diagnosis not present

## 2015-11-26 DIAGNOSIS — M2550 Pain in unspecified joint: Secondary | ICD-10-CM | POA: Diagnosis not present

## 2015-11-29 ENCOUNTER — Encounter (HOSPITAL_COMMUNITY): Payer: Self-pay | Admitting: Emergency Medicine

## 2015-11-29 ENCOUNTER — Inpatient Hospital Stay (HOSPITAL_COMMUNITY)
Admission: EM | Admit: 2015-11-29 | Discharge: 2015-12-04 | DRG: 191 | Disposition: A | Discharge status: Home Health | Payer: Medicare Other | Attending: Internal Medicine | Admitting: Internal Medicine

## 2015-11-29 DIAGNOSIS — R778 Other specified abnormalities of plasma proteins: Secondary | ICD-10-CM

## 2015-11-29 DIAGNOSIS — R0602 Shortness of breath: Secondary | ICD-10-CM | POA: Diagnosis not present

## 2015-11-29 DIAGNOSIS — Z9981 Dependence on supplemental oxygen: Secondary | ICD-10-CM | POA: Diagnosis not present

## 2015-11-29 DIAGNOSIS — R069 Unspecified abnormalities of breathing: Secondary | ICD-10-CM | POA: Diagnosis not present

## 2015-11-29 DIAGNOSIS — I130 Hypertensive heart and chronic kidney disease with heart failure and stage 1 through stage 4 chronic kidney disease, or unspecified chronic kidney disease: Secondary | ICD-10-CM | POA: Diagnosis not present

## 2015-11-29 DIAGNOSIS — R7989 Other specified abnormal findings of blood chemistry: Secondary | ICD-10-CM

## 2015-11-29 DIAGNOSIS — I5042 Chronic combined systolic (congestive) and diastolic (congestive) heart failure: Secondary | ICD-10-CM | POA: Diagnosis not present

## 2015-11-29 DIAGNOSIS — I429 Cardiomyopathy, unspecified: Secondary | ICD-10-CM | POA: Diagnosis not present

## 2015-11-29 DIAGNOSIS — Z950 Presence of cardiac pacemaker: Secondary | ICD-10-CM | POA: Diagnosis not present

## 2015-11-29 DIAGNOSIS — I10 Essential (primary) hypertension: Secondary | ICD-10-CM | POA: Diagnosis not present

## 2015-11-29 DIAGNOSIS — Z87891 Personal history of nicotine dependence: Secondary | ICD-10-CM

## 2015-11-29 DIAGNOSIS — N179 Acute kidney failure, unspecified: Secondary | ICD-10-CM | POA: Insufficient documentation

## 2015-11-29 DIAGNOSIS — M109 Gout, unspecified: Secondary | ICD-10-CM | POA: Diagnosis present

## 2015-11-29 DIAGNOSIS — I248 Other forms of acute ischemic heart disease: Secondary | ICD-10-CM | POA: Diagnosis present

## 2015-11-29 DIAGNOSIS — E875 Hyperkalemia: Secondary | ICD-10-CM | POA: Diagnosis not present

## 2015-11-29 DIAGNOSIS — H409 Unspecified glaucoma: Secondary | ICD-10-CM | POA: Diagnosis not present

## 2015-11-29 DIAGNOSIS — R112 Nausea with vomiting, unspecified: Secondary | ICD-10-CM | POA: Diagnosis not present

## 2015-11-29 DIAGNOSIS — J9611 Chronic respiratory failure with hypoxia: Secondary | ICD-10-CM | POA: Diagnosis not present

## 2015-11-29 DIAGNOSIS — Z7982 Long term (current) use of aspirin: Secondary | ICD-10-CM | POA: Diagnosis not present

## 2015-11-29 DIAGNOSIS — R1310 Dysphagia, unspecified: Secondary | ICD-10-CM | POA: Diagnosis present

## 2015-11-29 DIAGNOSIS — J441 Chronic obstructive pulmonary disease with (acute) exacerbation: Principal | ICD-10-CM | POA: Diagnosis present

## 2015-11-29 DIAGNOSIS — D649 Anemia, unspecified: Secondary | ICD-10-CM | POA: Diagnosis not present

## 2015-11-29 DIAGNOSIS — N184 Chronic kidney disease, stage 4 (severe): Secondary | ICD-10-CM | POA: Diagnosis present

## 2015-11-29 DIAGNOSIS — M1990 Unspecified osteoarthritis, unspecified site: Secondary | ICD-10-CM | POA: Diagnosis not present

## 2015-11-29 DIAGNOSIS — I5022 Chronic systolic (congestive) heart failure: Secondary | ICD-10-CM | POA: Diagnosis not present

## 2015-11-29 DIAGNOSIS — Z79899 Other long term (current) drug therapy: Secondary | ICD-10-CM

## 2015-11-29 DIAGNOSIS — K219 Gastro-esophageal reflux disease without esophagitis: Secondary | ICD-10-CM | POA: Diagnosis present

## 2015-11-29 DIAGNOSIS — Z6836 Body mass index (BMI) 36.0-36.9, adult: Secondary | ICD-10-CM

## 2015-11-29 DIAGNOSIS — R062 Wheezing: Secondary | ICD-10-CM | POA: Diagnosis not present

## 2015-11-29 DIAGNOSIS — I495 Sick sinus syndrome: Secondary | ICD-10-CM | POA: Diagnosis present

## 2015-11-29 DIAGNOSIS — E669 Obesity, unspecified: Secondary | ICD-10-CM | POA: Diagnosis present

## 2015-11-29 DIAGNOSIS — F329 Major depressive disorder, single episode, unspecified: Secondary | ICD-10-CM | POA: Diagnosis present

## 2015-11-29 LAB — CBC
HCT: 36.2 % (ref 36.0–46.0)
Hemoglobin: 11.6 g/dL — ABNORMAL LOW (ref 12.0–15.0)
MCH: 30.1 pg (ref 26.0–34.0)
MCHC: 32 g/dL (ref 30.0–36.0)
MCV: 93.8 fL (ref 78.0–100.0)
PLATELETS: 174 10*3/uL (ref 150–400)
RBC: 3.86 MIL/uL — ABNORMAL LOW (ref 3.87–5.11)
RDW: 13.4 % (ref 11.5–15.5)
WBC: 9.2 10*3/uL (ref 4.0–10.5)

## 2015-11-29 LAB — I-STAT TROPONIN, ED: TROPONIN I, POC: 0.16 ng/mL — AB (ref 0.00–0.08)

## 2015-11-29 MED ORDER — ALBUTEROL SULFATE (2.5 MG/3ML) 0.083% IN NEBU
5.0000 mg | INHALATION_SOLUTION | Freq: Once | RESPIRATORY_TRACT | Status: AC
  Administered 2015-11-29: 5 mg via RESPIRATORY_TRACT
  Filled 2015-11-29: qty 6

## 2015-11-29 MED ORDER — ASPIRIN 81 MG PO CHEW
324.0000 mg | CHEWABLE_TABLET | Freq: Once | ORAL | Status: AC
  Administered 2015-11-30: 324 mg via ORAL
  Filled 2015-11-29: qty 4

## 2015-11-29 NOTE — ED Notes (Signed)
MD at bedside. 

## 2015-11-29 NOTE — ED Notes (Signed)
Per EMS pt was at home and took her scheduled albut treatment but continued to feel SOB even on her 2L of O2.  She reports that she has felt "bad" for about a week, experiencing N/V.  She does have a hx of COPD and CHF, pacemaker as well.  She was given 125 [address]. [place].

## 2015-11-29 NOTE — ED Provider Notes (Signed)
CSN: [id number]     Arrival date & time 11/29/15  2301 History  By signing my name below, I, [name], attest that this documentation has been prepared under the direction and in the presence of [name], MD. Electronically Signed: Irene Barrett, ED Scribe. 11/29/2015. 11:47 PM.     Chief Complaint  Patient presents with  . Shortness of Breath   The history is provided by the patient. No language interpreter was used.  HPI Comments: [name] is a 82 y.o. Female with a hx of HTN, tachy-brady syndrome, pacemaker, CHF, COPD, CKD, and cardiomyopathy brought in by EMS who presents to the Emergency Department complaining of gradually worsening SOB onset one week ago. Pt is on 2L O2 at home. Pt states that she had a scheduled albuterol treatment but continued to feel SOB even on her O2. She has been feeling intermittently "bad" for a week with associated nausea, fatigue, and vomiting. Pt states that she feels constipated. She was given 125 solumedrol en route. Pt reports mild relief with breathing treatments in the ED. Pt lives with her granddaughter. She denies fever, chills, cough, leg swelling, chest pain, abdominal pain, or diarrhea.    Past Medical History  Diagnosis Date  . Hypertension   . Obesity   . Depression   . Tachy-brady syndrome ([place])     a. 03/2006 s/p SJM 5356 Verity ADx XLDR DC PPM (ser#: [id number]).  . Gout   . Chronic systolic CHF (congestive heart failure) ([place])     a. 10/2013 Echo: EF 25-30%, diff HK, Gr 1 DD, triv AI, mild to mod MR, dev dil LA, mild RV dysfxn, mildly dil RA, mod TR.  . Cardiomyopathy ([place])     a. ? ischemic vs non-ischemic;  b. 09/2013 Echo: EF 25-30%.  Marland Kitchen COPD (chronic obstructive pulmonary disease) ([place])   . Glaucoma of both eyes   . GERD (gastroesophageal reflux disease)   . CKD (chronic kidney disease), stage III   . Kidney stones   . Arthritis    Past Surgical History  Procedure Laterality Date  . Cardiac catheterization  10/2000     Archie Endo 10/10/2010  . Insert / replace / remove pacemaker  08/2000; 03/2006    Archie Endo 10/07/2010  . Cholecystectomy open    . Kidney stone surgery      "hospitalized for 19 days"  . Tubal ligation     Family History  Problem Relation Age of Onset  . Heart attack Neg Hx   . Stroke Neg Hx   . Hypertension Mother    Social History  Substance Use Topics  . Smoking status: Former Smoker    Types: Cigarettes  . Smokeless tobacco: Never Used     Comment: "never inhaled cigarettes; just lit them"  . Alcohol Use: Yes     Comment: 07/23/2014 "I used to drink beer; nothing in 3-4 years"   OB History    No data available     Review of Systems  Constitutional: Positive for fatigue. Negative for fever and chills.  Respiratory: Positive for shortness of breath. Negative for cough.   Cardiovascular: Negative for chest pain and leg swelling.  Gastrointestinal: Positive for nausea, vomiting and constipation. Negative for abdominal pain and diarrhea.  All other systems reviewed and are negative.  Allergies  Review of patient's allergies indicates no known allergies.  Home Medications   Prior to Admission medications   Medication Sig Start Date End Date Taking? Authorizing Provider  albuterol (PROVENTIL) (  2.5 MG/3ML) 0.083% nebulizer solution INHALE 1 VIAL VIA NEBULIZER EVERY 6 HOURS AS NEEDED FOR WHEEZING AND SHORTNESS OF BREATH 08/19/15  Yes [name], MD  allopurinol (ZYLOPRIM) 100 MG tablet TAKE 2 TABLETS BY MOUTH ONCE DAILY 03/20/15  Yes [name], MD  amLODipine (NORVASC) 5 MG tablet TAKE ONE TABLET BY MOUTH ONCE DAILY. 10/30/15  Yes [name], MD  aspirin 81 MG tablet Take 1 tablet (81 mg total) by mouth daily. 04/27/13  Yes Deboraha Sprang, MD  carvedilol (COREG) 25 MG tablet TAKE 1 TABLET BY MOUTH TWICE DAILY WITH A MEAL 07/03/15  Yes [name], MD  citalopram (CELEXA) 10 MG tablet TAKE 1 TABLET BY MOUTH DAILY 06/18/15  Yes [name], MD  ferrous sulfate  325 (65 FE) MG EC tablet Take 325 mg by mouth daily with breakfast.   Yes Historical Provider, MD  furosemide (LASIX) 20 MG tablet TAKE ONE TABLET BY MOUTH TWO TIMES DAILY. 03/20/15  Yes [name], MD  pantoprazole (PROTONIX) 40 MG tablet TAKE 1 TABLET BY MOUTH EVERY DAY 05/23/15  Yes [name], MD  amLODipine (NORVASC) 5 MG tablet TAKE ONE TABLET BY MOUTH ONCE DAILY. 11/21/15   [name], MD  diclofenac sodium (VOLTAREN) 1 % GEL Apply 2 g topically 4 (four) times daily. 01/11/15   [name], MD  [place] 125 MCG tablet TAKE ONE TABLET BY MOUTH DAILY 03/26/14   [name], MD  feeding supplement, ENSURE ENLIVE, (ENSURE ENLIVE) LIQD Take 237 mLs by mouth daily. 02/19/15   [name], MD  metoCLOPramide (REGLAN) 10 MG tablet TAKE 1 TABLET BY MOUTH FOUR TIMES A DAY 03/20/15   [name], MD  metoCLOPramide (REGLAN) 5 MG tablet TAKE 1 TABLET BY MOUTH 3 TIMES A DAY BEFORE MEALS 11/27/15   [name], MD  OXYGEN Inhale 2 L into the lungs continuous. Reported on 11/29/2015    Historical Provider, MD  [place] one GERI CHAIR 01/09/15   [name], MD  UNABLE TO FIND Pt needs PULL UP incontinent underware.  Wt 202lbs 01/09/15   [name], MD  UNABLE TO FIND Pt needs blue chux underpads 01/09/15   [name], MD   BP 115/72 mmHg  Pulse 77  Temp(Src) 98.2 F (36.8 C) (Axillary)  Resp 11  Ht 5\' 4"  (1.626 m)  Wt 215 lb (97.523 kg)  BMI 36.89 kg/m2  SpO2 96% Physical Exam  Constitutional: She is oriented to person, place, and time. No distress.  Elderly  HENT:  Head: Normocephalic and atraumatic.  Eyes: Pupils are equal, round, and reactive to light.  Cardiovascular: Normal rate, regular rhythm and normal heart sounds.   No murmur heard. Pulmonary/Chest: Effort normal. No respiratory distress. She has wheezes.  Diffuse expiratory wheezing  Abdominal: Soft. Bowel sounds are normal. There is no tenderness. There is no rebound.   Musculoskeletal:  Trace bilateral lower extremity edema  Neurological: She is alert and oriented to person, place, and time.  Skin: Skin is warm and dry.  Psychiatric: She has a normal mood and affect.  Nursing note and vitals reviewed.   ED Course  Procedures (including critical care time) DIAGNOSTIC STUDIES: Oxygen Saturation is 100% on RA, normal by my interpretation.    COORDINATION OF CARE: 11:47 PM-Discussed treatment plan which includes labs, EKG, and x-ray with pt at bedside and pt agreed to plan.    Labs Review  Labs Reviewed  CBC - Abnormal; Notable for the following:    RBC 3.86 (*)    Hemoglobin 11.6 (*)    All other components within normal limits  COMPREHENSIVE METABOLIC PANEL - Abnormal; Notable for the following:    Chloride 98 (*)    Glucose, Bld 104 (*)    BUN 36 (*)    Creatinine, Ser 2.47 (*)    ALT 9 (*)    GFR calc non Af Amer 16 (*)    GFR calc Af Amer 19 (*)    All other components within normal limits  BRAIN NATRIURETIC PEPTIDE - Abnormal; Notable for the following:    B Natriuretic Peptide 184.9 (*)    All other components within normal limits  I-STAT TROPOININ, ED - Abnormal; Notable for the following:    Troponin i, poc 0.16 (*)    All other components within normal limits  LIPASE, BLOOD  URINALYSIS, ROUTINE W REFLEX MICROSCOPIC (NOT AT [facility])    Imaging Review Dg Abd Acute W/chest  11/30/2015  CLINICAL DATA:  Acute onset of shortness of breath, nausea and vomiting. Initial encounter. EXAM: DG ABDOMEN ACUTE W/ 1V CHEST COMPARISON:  Chest radiograph performed 07/23/2014 FINDINGS: The lungs are well-aerated. Mild right basilar and left midlung atelectasis are noted. There is no evidence of pleural effusion or pneumothorax. The cardiomediastinal silhouette is mildly enlarged. A pacemaker is noted overlying the right chest wall, with leads ending overlying the right ventricle. The visualized bowel gas pattern is unremarkable. Scattered stool and air  are seen within the colon; there is no evidence of small bowel dilatation to suggest obstruction. No free intra-abdominal air is identified on the provided upright view. No acute osseous abnormalities are seen; the sacroiliac joints are unremarkable in appearance. IMPRESSION: 1. Unremarkable bowel gas pattern; no free intra-abdominal air seen. Small to moderate amount of stool noted in the colon. 2. Mild right basilar and left midlung atelectasis noted. 3. Mild cardiomegaly. Electronically Signed   By: Garald Balding M.D.   On: 11/30/2015 01:44   I have personally reviewed and evaluated these images and lab results as part of my medical decision-making.   EKG Interpretation   Date/Time:  Friday November 29 2015 23:20:58 EDT Ventricular Rate:  78 PR Interval:    QRS Duration: 134 QT Interval:  442 QTC Calculation: 504 R Axis:   -57 Text Interpretation:  Sinus rhythm Ventricular premature complex  Borderline prolonged PR interval LVH with IVCD, LAD and secondary repol  abnrm Inferior infarct, old Prolonged QT interval ST changes laterally  Confirmed by Sejla Marzano  MD, Anav Lammert ([id number]) on 11/29/2015 11:53:49 PM      MDM   Final diagnoses:  COPD exacerbation (HCC)  Elevated troponin    Agent presents with shortness of breath and generalized weakness. Wheezing on exam. *COPD and CHF. No external signs of volume overload at this time. She was given steroids and a DuoNeb in route. She has a duo neb going at this time. Denies infectious symptoms. Basic labwork obtained. EKG is nonischemic. Troponin mildly elevated at 0.16. Patient has an EF of 15% and LVH on her EKG. She's currently chest pain-free. She was given full dose aspirin. Clinically, patient more consistent with COPD exacerbation. Patient improved after DuoNeb. However, given mild elevation of troponin, will admit for serial troponins and frequent duo nebs for COPD exacerbation. Discussed with Dr. Loleta Books.  I personally performed the services  described in this documentation, which was scribed in my presence. The recorded information  has been reviewed and is accurate.    [name], MD 11/30/15 [phone]

## 2015-11-30 ENCOUNTER — Encounter (HOSPITAL_COMMUNITY): Payer: Self-pay | Admitting: Family Medicine

## 2015-11-30 ENCOUNTER — Emergency Department (HOSPITAL_COMMUNITY): Payer: Medicare Other

## 2015-11-30 DIAGNOSIS — Z6836 Body mass index (BMI) 36.0-36.9, adult: Secondary | ICD-10-CM | POA: Diagnosis not present

## 2015-11-30 DIAGNOSIS — R7989 Other specified abnormal findings of blood chemistry: Secondary | ICD-10-CM

## 2015-11-30 DIAGNOSIS — Z9981 Dependence on supplemental oxygen: Secondary | ICD-10-CM | POA: Diagnosis not present

## 2015-11-30 DIAGNOSIS — F329 Major depressive disorder, single episode, unspecified: Secondary | ICD-10-CM | POA: Diagnosis present

## 2015-11-30 DIAGNOSIS — R778 Other specified abnormalities of plasma proteins: Secondary | ICD-10-CM | POA: Diagnosis present

## 2015-11-30 DIAGNOSIS — I130 Hypertensive heart and chronic kidney disease with heart failure and stage 1 through stage 4 chronic kidney disease, or unspecified chronic kidney disease: Secondary | ICD-10-CM | POA: Diagnosis present

## 2015-11-30 DIAGNOSIS — I5042 Chronic combined systolic (congestive) and diastolic (congestive) heart failure: Secondary | ICD-10-CM | POA: Diagnosis not present

## 2015-11-30 DIAGNOSIS — N179 Acute kidney failure, unspecified: Secondary | ICD-10-CM | POA: Diagnosis not present

## 2015-11-30 DIAGNOSIS — I5022 Chronic systolic (congestive) heart failure: Secondary | ICD-10-CM

## 2015-11-30 DIAGNOSIS — R112 Nausea with vomiting, unspecified: Secondary | ICD-10-CM | POA: Diagnosis not present

## 2015-11-30 DIAGNOSIS — J441 Chronic obstructive pulmonary disease with (acute) exacerbation: Secondary | ICD-10-CM | POA: Diagnosis present

## 2015-11-30 DIAGNOSIS — H409 Unspecified glaucoma: Secondary | ICD-10-CM | POA: Diagnosis present

## 2015-11-30 DIAGNOSIS — Z950 Presence of cardiac pacemaker: Secondary | ICD-10-CM | POA: Diagnosis not present

## 2015-11-30 DIAGNOSIS — Z87891 Personal history of nicotine dependence: Secondary | ICD-10-CM | POA: Diagnosis not present

## 2015-11-30 DIAGNOSIS — Z7982 Long term (current) use of aspirin: Secondary | ICD-10-CM | POA: Diagnosis not present

## 2015-11-30 DIAGNOSIS — R0602 Shortness of breath: Secondary | ICD-10-CM | POA: Diagnosis present

## 2015-11-30 DIAGNOSIS — J9611 Chronic respiratory failure with hypoxia: Secondary | ICD-10-CM | POA: Diagnosis present

## 2015-11-30 DIAGNOSIS — I10 Essential (primary) hypertension: Secondary | ICD-10-CM

## 2015-11-30 DIAGNOSIS — I429 Cardiomyopathy, unspecified: Secondary | ICD-10-CM | POA: Diagnosis present

## 2015-11-30 DIAGNOSIS — D649 Anemia, unspecified: Secondary | ICD-10-CM

## 2015-11-30 DIAGNOSIS — I248 Other forms of acute ischemic heart disease: Secondary | ICD-10-CM | POA: Diagnosis present

## 2015-11-30 DIAGNOSIS — E875 Hyperkalemia: Secondary | ICD-10-CM | POA: Diagnosis present

## 2015-11-30 DIAGNOSIS — K219 Gastro-esophageal reflux disease without esophagitis: Secondary | ICD-10-CM | POA: Diagnosis present

## 2015-11-30 DIAGNOSIS — I495 Sick sinus syndrome: Secondary | ICD-10-CM

## 2015-11-30 DIAGNOSIS — M1990 Unspecified osteoarthritis, unspecified site: Secondary | ICD-10-CM | POA: Diagnosis present

## 2015-11-30 DIAGNOSIS — R1310 Dysphagia, unspecified: Secondary | ICD-10-CM | POA: Diagnosis present

## 2015-11-30 DIAGNOSIS — N184 Chronic kidney disease, stage 4 (severe): Secondary | ICD-10-CM | POA: Diagnosis not present

## 2015-11-30 DIAGNOSIS — M109 Gout, unspecified: Secondary | ICD-10-CM | POA: Diagnosis present

## 2015-11-30 DIAGNOSIS — E669 Obesity, unspecified: Secondary | ICD-10-CM | POA: Diagnosis present

## 2015-11-30 DIAGNOSIS — Z79899 Other long term (current) drug therapy: Secondary | ICD-10-CM | POA: Diagnosis not present

## 2015-11-30 LAB — TROPONIN I: TROPONIN I: 0.16 ng/mL — AB (ref <0.03)

## 2015-11-30 LAB — COMPREHENSIVE METABOLIC PANEL
ALBUMIN: 3.6 g/dL (ref 3.5–5.0)
ALK PHOS: 89 U/L (ref 38–126)
ALT: 9 U/L — AB (ref 14–54)
ANION GAP: 9 (ref 5–15)
AST: 18 U/L (ref 15–41)
BUN: 36 mg/dL — ABNORMAL HIGH (ref 6–20)
CALCIUM: 9.1 mg/dL (ref 8.9–10.3)
CHLORIDE: 98 mmol/L — AB (ref 101–111)
CO2: 30 mmol/L (ref 22–32)
Creatinine, Ser: 2.47 mg/dL — ABNORMAL HIGH (ref 0.44–1.00)
GFR calc non Af Amer: 16 mL/min — ABNORMAL LOW (ref >60)
GFR, EST AFRICAN AMERICAN: 19 mL/min — AB (ref >60)
GLUCOSE: 104 mg/dL — AB (ref 65–99)
Potassium: 4.2 mmol/L (ref 3.5–5.1)
SODIUM: 137 mmol/L (ref 135–145)
Total Bilirubin: 0.3 mg/dL (ref 0.3–1.2)
Total Protein: 8 g/dL (ref 6.5–8.1)

## 2015-11-30 LAB — CBC
HCT: 32.9 % — ABNORMAL LOW (ref 36.0–46.0)
HEMOGLOBIN: 10.4 g/dL — AB (ref 12.0–15.0)
MCH: 29.1 pg (ref 26.0–34.0)
MCHC: 31.6 g/dL (ref 30.0–36.0)
MCV: 91.9 fL (ref 78.0–100.0)
PLATELETS: 171 10*3/uL (ref 150–400)
RBC: 3.58 MIL/uL — AB (ref 3.87–5.11)
RDW: 13.1 % (ref 11.5–15.5)
WBC: 4.6 10*3/uL (ref 4.0–10.5)

## 2015-11-30 LAB — BASIC METABOLIC PANEL
ANION GAP: 8 (ref 5–15)
BUN: 42 mg/dL — ABNORMAL HIGH (ref 6–20)
CALCIUM: 8.9 mg/dL (ref 8.9–10.3)
CO2: 28 mmol/L (ref 22–32)
CREATININE: 2.88 mg/dL — AB (ref 0.44–1.00)
Chloride: 99 mmol/L — ABNORMAL LOW (ref 101–111)
GFR calc non Af Amer: 13 mL/min — ABNORMAL LOW (ref >60)
GFR, EST AFRICAN AMERICAN: 15 mL/min — AB (ref >60)
Glucose, Bld: 153 mg/dL — ABNORMAL HIGH (ref 65–99)
Potassium: 4.4 mmol/L (ref 3.5–5.1)
SODIUM: 135 mmol/L (ref 135–145)

## 2015-11-30 LAB — URINALYSIS, ROUTINE W REFLEX MICROSCOPIC
Bilirubin Urine: NEGATIVE
Glucose, UA: NEGATIVE mg/dL
Hgb urine dipstick: NEGATIVE
Ketones, ur: NEGATIVE mg/dL
Leukocytes, UA: NEGATIVE
NITRITE: NEGATIVE
PH: 5 (ref 5.0–8.0)
Protein, ur: NEGATIVE mg/dL
SPECIFIC GRAVITY, URINE: 1.017 (ref 1.005–1.030)

## 2015-11-30 LAB — LIPASE, BLOOD: Lipase: 48 U/L (ref 11–51)

## 2015-11-30 LAB — DIGOXIN LEVEL

## 2015-11-30 LAB — BRAIN NATRIURETIC PEPTIDE: B NATRIURETIC PEPTIDE 5: 184.9 pg/mL — AB (ref 0.0–100.0)

## 2015-11-30 MED ORDER — AMLODIPINE BESYLATE 5 MG PO TABS
5.0000 mg | ORAL_TABLET | Freq: Every day | ORAL | Status: DC
  Administered 2015-11-30 – 2015-12-02 (×3): 5 mg via ORAL
  Filled 2015-11-30 (×3): qty 1

## 2015-11-30 MED ORDER — PREDNISONE 20 MG PO TABS
40.0000 mg | ORAL_TABLET | Freq: Every day | ORAL | Status: DC
Start: 2015-11-30 — End: 2015-11-30
  Administered 2015-11-30: 40 mg via ORAL
  Filled 2015-11-30: qty 2

## 2015-11-30 MED ORDER — CARVEDILOL 25 MG PO TABS
25.0000 mg | ORAL_TABLET | Freq: Two times a day (BID) | ORAL | Status: DC
  Administered 2015-11-30 – 2015-12-02 (×5): 25 mg via ORAL
  Filled 2015-11-30 (×5): qty 1

## 2015-11-30 MED ORDER — ALBUTEROL SULFATE (2.5 MG/3ML) 0.083% IN NEBU
2.5000 mg | INHALATION_SOLUTION | Freq: Four times a day (QID) | RESPIRATORY_TRACT | Status: DC
  Administered 2015-11-30 (×3): 2.5 mg via RESPIRATORY_TRACT
  Filled 2015-11-30 (×2): qty 3

## 2015-11-30 MED ORDER — FERROUS SULFATE 325 (65 FE) MG PO TABS
325.0000 mg | ORAL_TABLET | Freq: Every day | ORAL | Status: DC
  Administered 2015-11-30 – 2015-12-02 (×3): 325 mg via ORAL
  Filled 2015-11-30 (×3): qty 1

## 2015-11-30 MED ORDER — METHYLPREDNISOLONE SODIUM SUCC 125 MG IJ SOLR
80.0000 mg | Freq: Once | INTRAMUSCULAR | Status: DC
  Filled 2015-11-30: qty 2

## 2015-11-30 MED ORDER — PANTOPRAZOLE SODIUM 40 MG PO TBEC
40.0000 mg | DELAYED_RELEASE_TABLET | Freq: Every day | ORAL | Status: DC
Start: 2015-11-30 — End: 2015-12-04
  Administered 2015-11-30 – 2015-12-04 (×5): 40 mg via ORAL
  Filled 2015-11-30 (×5): qty 1

## 2015-11-30 MED ORDER — DOCUSATE SODIUM 100 MG PO CAPS
100.0000 mg | ORAL_CAPSULE | Freq: Two times a day (BID) | ORAL | Status: DC
  Administered 2015-12-01 – 2015-12-03 (×3): 100 mg via ORAL
  Filled 2015-11-30 (×5): qty 1

## 2015-11-30 MED ORDER — CITALOPRAM HYDROBROMIDE 10 MG PO TABS
10.0000 mg | ORAL_TABLET | Freq: Every day | ORAL | Status: DC
  Administered 2015-11-30 – 2015-12-04 (×5): 10 mg via ORAL
  Filled 2015-11-30 (×5): qty 1

## 2015-11-30 MED ORDER — ALBUTEROL SULFATE (2.5 MG/3ML) 0.083% IN NEBU
2.5000 mg | INHALATION_SOLUTION | RESPIRATORY_TRACT | Status: DC | PRN
  Filled 2015-11-30: qty 3

## 2015-11-30 MED ORDER — HEPARIN SODIUM (PORCINE) 5000 UNIT/ML IJ SOLN
5000.0000 [IU] | Freq: Three times a day (TID) | INTRAMUSCULAR | Status: DC
  Administered 2015-11-30 – 2015-12-04 (×13): 5000 [IU] via SUBCUTANEOUS
  Filled 2015-11-30 (×13): qty 1

## 2015-11-30 MED ORDER — ALLOPURINOL 100 MG PO TABS
100.0000 mg | ORAL_TABLET | Freq: Every day | ORAL | Status: DC
  Administered 2015-11-30 – 2015-12-04 (×5): 100 mg via ORAL
  Filled 2015-11-30 (×5): qty 1

## 2015-11-30 MED ORDER — BUDESONIDE 0.25 MG/2ML IN SUSP
0.2500 mg | Freq: Two times a day (BID) | RESPIRATORY_TRACT | Status: DC
  Administered 2015-11-30 – 2015-12-04 (×8): 0.25 mg via RESPIRATORY_TRACT
  Filled 2015-11-30 (×8): qty 2

## 2015-11-30 MED ORDER — ASPIRIN EC 81 MG PO TBEC
81.0000 mg | DELAYED_RELEASE_TABLET | Freq: Every day | ORAL | Status: DC
  Administered 2015-11-30 – 2015-12-04 (×5): 81 mg via ORAL
  Filled 2015-11-30 (×5): qty 1

## 2015-11-30 MED ORDER — IPRATROPIUM-ALBUTEROL 0.5-2.5 (3) MG/3ML IN SOLN
3.0000 mL | Freq: Four times a day (QID) | RESPIRATORY_TRACT | Status: DC
  Administered 2015-11-30 – 2015-12-01 (×5): 3 mL via RESPIRATORY_TRACT
  Filled 2015-11-30 (×5): qty 3

## 2015-11-30 MED ORDER — SENNA 8.6 MG PO TABS
2.0000 | ORAL_TABLET | Freq: Every day | ORAL | Status: DC
  Administered 2015-11-30 – 2015-12-03 (×3): 17.2 mg via ORAL
  Filled 2015-11-30 (×5): qty 2

## 2015-11-30 MED ORDER — ACETAMINOPHEN 650 MG RE SUPP: 650.0000 mg | Freq: Four times a day (QID) | RECTAL | Status: DC | PRN

## 2015-11-30 MED ORDER — METHYLPREDNISOLONE SODIUM SUCC 125 MG IJ SOLR
60.0000 mg | Freq: Two times a day (BID) | INTRAMUSCULAR | Status: DC
  Administered 2015-11-30 – 2015-12-01 (×2): 60 mg via INTRAVENOUS
  Filled 2015-11-30 (×3): qty 2

## 2015-11-30 MED ORDER — ACETAMINOPHEN 325 MG PO TABS: 650.0000 mg | ORAL_TABLET | Freq: Four times a day (QID) | ORAL | Status: DC | PRN

## 2015-11-30 MED ORDER — FUROSEMIDE 20 MG PO TABS
20.0000 mg | ORAL_TABLET | Freq: Two times a day (BID) | ORAL | Status: DC
  Administered 2015-11-30 (×2): 20 mg via ORAL
  Filled 2015-11-30 (×2): qty 1

## 2015-11-30 NOTE — Progress Notes (Addendum)
PROGRESS NOTE  [name] [id number] DOB: [birth year] DOA: 11/29/2015 PCP: Odette Fraction, MD  Brief History:  82 year old female with a history of COPD, chronic respiratory failure on 2 L nasal cannula, chronic systolic CHF, CKD, hypertension, SSS s/p PPM presented with one-week history of progressive shortness of breath and malaise that had worsened in the 24 hours prior to admission. The patient's shortness of breath worsened to the point where she was unable to walk across a room in her house on the day of admission. The patient complained of a nonproductive cough without any fevers, chills, chest pain, nausea, vomiting, diarrhea. She states that she weighs herself regularly, and states that her weight has been stable.  Chest x-ray was negative for any infiltrates or pulmonary edema. Workup in the ED suggested COPD exacerbation. The patient was started on intravenous steroids. Old records reviewed and summarized.  Assessment/Plan: Acute COPD exacerbation -Patient still has wheezing on examination -Switched to intravenous steroids -Add Pulmicort -Continue DuoNebs--add Atrovent -The patient's family continues to smoke around the patient -Wean oxygen for saturation greater than 92% -personally reviewed CXR--no infiltrates, no edema/effusions  Chronic systolic and diastolic CHF -Daily weights -Appears clinically euvolemic -Continue home dose furosemide -Continue carvedilol  Elevated troponin -No chest pain -Likely due to demand ischemia in the setting of CKD -Personally reviewed EKG--sinus, IVCD, TWI V3-V6  Acute on chronicCKD stage IV -Baseline creatinine 2.1-2.4 -hold furosemide -am BMP  Hypertension -Continue amlodipine and carvedilol  Gouty arthritis -Continue allopurinol -Decrease dose to 100 mg daily  Tachybradycardia syndrome -Status post permanent pacemaker -Continue carvedilol  Disposition Plan:   Home in 1-2 days  Family Communication:   No  Family at bedside--Total time spent 35 minutes.  Greater than 50% spent face to face counseling and coordinating care.  [id number]  Consultants:  none  Code Status:  FULL   DVT Prophylaxis:  [place] Heparin   Procedures: As Listed in Progress Note Above  Antibiotics: None    Subjective: Patient says that she was breathing 50% better. Denies any fevers, chills, chest pain, sinus breath, nausea, vomiting, diarrhea, abdominal pain. Complains of constipation. No hematochezia or melena.  Objective: Filed Vitals:   11/30/15 0030 11/30/15 0429 11/30/15 0503 11/30/15 1234  BP: 115/72 110/51  113/63  Pulse: 77 77  75  Temp:  97.8 F (36.6 C)  98.1 F (36.7 C)  TempSrc:  Oral  Oral  Resp: 11 24  20   Height:  5\' 5"  (1.651 m)    Weight:  94.711 kg (208 lb 12.8 oz)    SpO2: 96% 96% 95% 100%    Intake/Output Summary (Last 24 hours) at 11/30/15 1523 Last data filed at 11/30/15 1413  Gross per 24 hour  Intake    840 ml  Output      0 ml  Net    840 ml   Weight change:  Exam:   General:  Pt is alert, follows commands appropriately, not in acute distress  HEENT: No icterus, No thrush, No neck mass, [place]/AT  Cardiovascular: RRR, S1/S2, no rubs, no gallops  Respiratory: CTA bilaterally, no wheezing, no crackles, no rhonchi  Abdomen: Soft/+BS, non tender, non distended, no guarding  Extremities: No edema, No lymphangitis, No petechiae, No rashes, no synovitis   Data Reviewed: I have personally reviewed following labs and imaging studies Basic Metabolic Panel:  Recent Labs Lab 11/29/15 2335 11/30/15 1058  NA 137 135  K 4.2 4.4  CL  98* 99*  CO2 30 28  GLUCOSE 104* 153*  BUN 36* 42*  CREATININE 2.47* 2.88*  CALCIUM 9.1 8.9   Liver Function Tests:  Recent Labs Lab 11/29/15 2335  AST 18  ALT 9*  ALKPHOS 89  BILITOT 0.3  PROT 8.0  ALBUMIN 3.6    Recent Labs Lab 11/29/15 2335  LIPASE 48   No results for input(s): AMMONIA in the last 168 hours. Coagulation  Profile: No results for input(s): INR, PROTIME in the last 168 hours. CBC:  Recent Labs Lab 11/29/15 2335 11/30/15 1058  WBC 9.2 4.6  HGB 11.6* 10.4*  HCT 36.2 32.9*  MCV 93.8 91.9  PLT 174 171   Cardiac Enzymes:  Recent Labs Lab 11/30/15 1058  TROPONINI 0.16*   BNP: Invalid input(s): POCBNP CBG: No results for input(s): GLUCAP in the last 168 hours. HbA1C: No results for input(s): HGBA1C in the last 72 hours. Urine analysis:    Component Value Date/Time   COLORURINE YELLOW 11/30/2015 0345   APPEARANCEUR CLEAR 11/30/2015 0345   LABSPEC 1.017 11/30/2015 0345   PHURINE 5.0 11/30/2015 0345   GLUCOSEU NEGATIVE 11/30/2015 0345   HGBUR NEGATIVE 11/30/2015 0345   BILIRUBINUR NEGATIVE 11/30/2015 0345   KETONESUR NEGATIVE 11/30/2015 0345   PROTEINUR NEGATIVE 11/30/2015 0345   UROBILINOGEN 0.2 11/13/2014 1239   NITRITE NEGATIVE 11/30/2015 0345   LEUKOCYTESUR NEGATIVE 11/30/2015 0345   Sepsis Labs: @LABRCNTIP (procalcitonin:4,lacticidven:4) )No results found for this or any previous visit (from the past 240 hour(s)).   Scheduled Meds: . albuterol  2.5 mg Nebulization Q6H  . allopurinol  100 mg Oral Daily  . amLODipine  5 mg Oral Daily  . aspirin EC  81 mg Oral Daily  . carvedilol  25 mg Oral BID WC  . citalopram  10 mg Oral Daily  . ferrous sulfate  325 mg Oral Q breakfast  . furosemide  20 mg Oral BID  . heparin  5,000 Units Subcutaneous Q8H  . pantoprazole  40 mg Oral Daily  . predniSONE  40 mg Oral Q breakfast   Continuous Infusions:   Procedures/Studies: Dg Abd Acute W/chest  11/30/2015  CLINICAL DATA:  Acute onset of shortness of breath, nausea and vomiting. Initial encounter. EXAM: DG ABDOMEN ACUTE W/ 1V CHEST COMPARISON:  Chest radiograph performed 07/23/2014 FINDINGS: The lungs are well-aerated. Mild right basilar and left midlung atelectasis are noted. There is no evidence of pleural effusion or pneumothorax. The cardiomediastinal silhouette is mildly  enlarged. A pacemaker is noted overlying the right chest wall, with leads ending overlying the right ventricle. The visualized bowel gas pattern is unremarkable. Scattered stool and air are seen within the colon; there is no evidence of small bowel dilatation to suggest obstruction. No free intra-abdominal air is identified on the provided upright view. No acute osseous abnormalities are seen; the sacroiliac joints are unremarkable in appearance. IMPRESSION: 1. Unremarkable bowel gas pattern; no free intra-abdominal air seen. Small to moderate amount of stool noted in the colon. 2. Mild right basilar and left midlung atelectasis noted. 3. Mild cardiomegaly. Electronically Signed   By: Garald Balding M.D.   On: 11/30/2015 01:44    Nomi Rudnicki, DO  [facility] Pager [phone]  If 7PM-7AM, please contact night-coverage [email or web address] Password [email or web address] 11/30/2015, 3:23 PM   LOS: 0 days

## 2015-11-30 NOTE — H&P (Signed)
History and Physical  Patient Name: [name]     [id number]    DOB: 05/16/1923    DOA: 11/29/2015 PCP: Odette Fraction, MD   Patient coming from: Home  Chief Complaint: Dyspnea  HPI: [name] is a 82 y.o. female with a past medical history significant for COPD on home O2 2L, CHF EF 45% who presents with dyspnea for one day.  The patient presents with 1 week of malaise, and one day of shortness of breath. She describes that she is usually able to walk around her house without shortness of breath, but today has been unable to walk across the room without huffing and puffing. Tonight, her granddaughter gave her her nightly treatment, but even despite this and her home oxygen she still felt short of breath and so she called EMS. He was given Solu-Medrol en route.  ED course: -Afebrile, hemodynamically stable, respiratory rate normal but SpO2 mid-90s on supplemental O2 initially NRB -Na 137, K 4.2, Cr 2.4 (baseline 2.0, GFR baseline 20-30 range), WBC 9.2K, Hgb 11.6 -BNP low -Troponin POC 0.16 -CXR showed pacer and no focal opacity or edema -ECG showed non-specific IVCD     ROS: Pt complains of shortness of breath, dyspnea, wheezing.  Pt denies any fever, sputum, chills, hemoptysis, chest pain, leg swelling, orthopnea.    All other systems negative except as just noted or noted in the history of present illness.    Past Medical History  Diagnosis Date  . Hypertension   . Obesity   . Depression   . Tachy-brady syndrome ([place])     a. 03/2006 s/p SJM [id number] Ver[place] ADx XLDR DC PPM (ser#: [id number]).  . Gout   . Chronic systolic CHF (congestive heart failure) ([place])     a. 10/2013 Echo: EF 25-30%, diff HK, Gr 1 DD, triv AI, mild to mod MR, dev dil LA, mild RV dysfxn, mildly dil RA, mod TR.  . Cardiomyopathy ([place])     a. ? ischemic vs non-ischemic;  b. 09/2013 Echo: EF 25-30%.  Marland Kitchen COPD (chronic obstructive pulmonary disease) ([place])   . Glaucoma of both eyes   . GERD  (gastroesophageal reflux disease)   . CKD (chronic kidney disease), stage III   . Kidney stones   . Arthritis     Past Surgical History  Procedure Laterality Date  . Cardiac catheterization  10/2000    Archie Endo 10/10/2010  . Insert / replace / remove pacemaker  08/2000; 03/2006    Archie Endo 10/07/2010  . Cholecystectomy open    . Kidney stone surgery      "hospitalized for 19 days"  . Tubal ligation      Social History: Patient lives with her granddaughter.  The patient walks without a cane or walker.  She has no dementia, jokes "they all know I've got to keep an eye on my money, can't fool me!"  Former smoker.  From [place] originally, moved here in 1954.  No Known Allergies  Family history: family history includes Hypertension in her mother. There is no history of Heart attack or Stroke.  Prior to Admission medications   Medication Sig Start Date End Date Taking? Authorizing Provider  albuterol (PROVENTIL) (2.5 MG/3ML) 0.083% nebulizer solution INHALE 1 VIAL VIA NEBULIZER EVERY 6 HOURS AS NEEDED FOR WHEEZING AND SHORTNESS OF BREATH 08/19/15  Yes [name], MD  allopurinol (ZYLOPRIM) 100 MG tablet TAKE 2 TABLETS BY MOUTH ONCE DAILY 03/20/15  Yes [name], MD  amLODipine ([place]) 5  MG tablet TAKE ONE TABLET BY MOUTH ONCE DAILY. 10/30/15  Yes [name], MD  aspirin 81 MG tablet Take 1 tablet (81 mg total) by mouth daily. 04/27/13  Yes Deboraha Sprang, MD  carvedilol (COREG) 25 MG tablet TAKE 1 TABLET BY MOUTH TWICE DAILY WITH A MEAL 07/03/15  Yes [name], MD  citalopram (CELEXA) 10 MG tablet TAKE 1 TABLET BY MOUTH DAILY 06/18/15  Yes [name], MD  ferrous sulfate 325 (65 FE) MG EC tablet Take 325 mg by mouth daily with breakfast.   Yes Historical Provider, MD  furosemide (LASIX) 20 MG tablet TAKE ONE TABLET BY MOUTH TWO TIMES DAILY. 03/20/15  Yes [name], MD  pantoprazole (PROTONIX) 40 MG tablet TAKE 1 TABLET BY MOUTH EVERY DAY 05/23/15  Yes [name], MD  amLODipine (NORVASC) 5 MG tablet TAKE ONE TABLET BY MOUTH ONCE DAILY. 11/21/15   [name], MD  diclofenac sodium (VOLTAREN) 1 % GEL Apply 2 g topically 4 (four) times daily. 01/11/15   [name], MD  [place] 125 MCG tablet TAKE ONE TABLET BY MOUTH DAILY 03/26/14   [name], MD  feeding supplement, ENSURE ENLIVE, (ENSURE ENLIVE) LIQD Take 237 mLs by mouth daily. 02/19/15   [name], MD  metoCLOPramide (REGLAN) 10 MG tablet TAKE 1 TABLET BY MOUTH FOUR TIMES A DAY 03/20/15   [name], MD  metoCLOPramide (REGLAN) 5 MG tablet TAKE 1 TABLET BY MOUTH 3 TIMES A DAY BEFORE MEALS 11/27/15   [name], MD  OXYGEN Inhale 2 L into the lungs continuous. Reported on 11/29/2015    Historical Provider, MD  [place] one GERI CHAIR 01/09/15   [name], MD  UNABLE TO FIND Pt needs PULL UP incontinent underware.  Wt 202lbs 01/09/15   [name], MD  UNABLE TO FIND Pt needs blue chux underpads 01/09/15   [name], MD       Physical Exam: BP 115/72 mmHg  Pulse 77  Temp(Src) 98.2 F (36.8 C) (Axillary)  Resp 11  Ht 5\' 4"  (1.626 m)  Wt 97.523 kg (215 lb)  BMI 36.89 kg/m2  SpO2 96% General appearance: Well-developed, elderly female, alert and in no acute distress.   Eyes: Conjunctiva normal, lids and lashes normal.   PERRL.  ENT: No nasal deformity, discharge.  OP moist without lesions.  Edentulous.   Lymph: No cervical or supraclavicular lymphadenopathy. Skin: Warm and dry.  No suspicious rashes or lesions. Cardiac: RRR, nl S1-S2, no murmurs appreciated.  Capillary refill is brisk.  JVP normal.  No LE edema.  Radial and DP pulses 2+ and symmetric. Respiratory: Normal respiratory rate and rhythm.  Occasional expiratory wheezes, basilar atelectatic crackles bilaterally. GI: Abdomen soft without rigidity.  No TTP. No ascites, distension, hepatosplenomegaly.   MSK: No deformities or effusions.  No clubbing/cyanosis. Neuro:  Cranial nerves intact.  Sensorium intact and responding to questions, attention normal.  Speech is fluent.  Moves all extremities equally and with normal coordination.    Psych: Affect normal, pleasant.  Judgment and insight appear normal.       Labs on Admission:  I have personally reviewed following labs and imaging studies: CBC:  Recent Labs Lab 11/29/15 2335  WBC 9.2  HGB 11.6*  HCT 36.2  MCV 93.8  PLT [id number]   Basic Metabolic Panel:  Recent Labs Lab 11/29/15 2335  NA 137  K 4.2  CL  98*  CO2 30  GLUCOSE 104*  BUN 36*  CREATININE 2.47*  CALCIUM 9.1   GFR: Estimated Creatinine Clearance: 16.8 mL/min (by C-G formula based on Cr of 2.47).  Liver Function Tests:  Recent Labs Lab 11/29/15 2335  AST 18  ALT 9*  ALKPHOS 89  BILITOT 0.3  PROT 8.0  ALBUMIN 3.6    Recent Labs Lab 11/29/15 2335  LIPASE 48         Radiological Exams on Admission: Personally reviewed: Dg Abd Acute W/chest  11/30/2015  CLINICAL DATA:  Acute onset of shortness of breath, nausea and vomiting. Initial encounter. EXAM: DG ABDOMEN ACUTE W/ 1V CHEST COMPARISON:  Chest radiograph performed 07/23/2014 FINDINGS: The lungs are well-aerated. Mild right basilar and left midlung atelectasis are noted. There is no evidence of pleural effusion or pneumothorax. The cardiomediastinal silhouette is mildly enlarged. A pacemaker is noted overlying the right chest wall, with leads ending overlying the right ventricle. The visualized bowel gas pattern is unremarkable. Scattered stool and air are seen within the colon; there is no evidence of small bowel dilatation to suggest obstruction. No free intra-abdominal air is identified on the provided upright view. No acute osseous abnormalities are seen; the sacroiliac joints are unremarkable in appearance. IMPRESSION: 1. Unremarkable bowel gas pattern; no free intra-abdominal air seen. Small to moderate amount of stool noted in the colon. 2. Mild right basilar and  left midlung atelectasis noted. 3. Mild cardiomegaly. Electronically Signed   By: Garald Balding M.D.   On: 11/30/2015 01:44    EKG: Independently reviewed. Rate 78, QTc prolonged given IVCD.  Nonspecific IVCD.  Echocardiogram 2016: EF 45-50% in Q000111Q, grade I diastolic dysfunction. Mild AR and moderate TR.       Assessment/Plan  1. COPD flare:  -Albuterol scheduled and PRN -Prednisone 40 mg for 5 days -Defer azithromycin given no new sputum or CXR opacity -COPD bundle   2. Elevated troponin:  Doubt ischemia.  Suspect poor clearance from CKD -Cycle enzymes  3. Chronic systolic CHF:  Last EF Q000111Q.  Currently euvolemic and BNP low. -Family unsure if they take digoxin, will order dig level -Continue BB, furosemide  4. CKD IV:  Stable  5. HTN:  -Continue amlodipine, carvedilol and Lasix -Continue aspirin  6. Anemia:  Normocytic, likely from renal disease.  Stable at baseline. -Continue iron  7. GERD:  -Continue PPI  8. Gout: -Continue allopurinol, decrease to 100 mg  9. Mood: -Continue citalopram        DVT prophylaxis: Heparin  Code Status: FULL  Family Communication: None present  Disposition Plan: Anticipate steroids and nebulized bronchodilators.  Discharge to home in 2-3 days. Consults called: None Admission status: INPATIENT, med surg    Medical decision making: Patient seen at 3:30 AM on 11/30/2015.  The patient was discussed with Dr. Dina Rich. What exists of the patient's chart was reviewed in depth.  Clinical condition: stable on nasal cannula at increased rate from baseline at present, breathing appears normal, hemodynanamically stable.        Edwin Dada [facility] Pager [phone]

## 2015-11-30 NOTE — Progress Notes (Signed)
Critical troponin called by lab- 0.16. Dr Tat texted. Previous trop 0.16 also

## 2015-11-30 NOTE — Progress Notes (Signed)
Nutrition Brief Note  Malnutrition Screening Tool result is inaccurate.  Weight stable according to most recent values obtained.  Patients she eats well at home- note patient lives with her granddaughter.  Patient consumed 45% of breakfast this AM.  Lunch delivered at time of RD visit. Please consult if nutrition needs are identified.  Wt Readings from Last 3 Encounters:  11/30/15 208 lb 12.8 oz (94.711 kg)  09/03/15 215 lb (97.523 kg)  03/11/15 208 lb (94.348 kg)    Weekend RD coverage:  Brynda Greathouse, MS RD LDN Weekend/After hours pager: [phone]

## 2015-12-01 ENCOUNTER — Inpatient Hospital Stay (HOSPITAL_COMMUNITY): Payer: Medicare Other

## 2015-12-01 DIAGNOSIS — N184 Chronic kidney disease, stage 4 (severe): Secondary | ICD-10-CM

## 2015-12-01 DIAGNOSIS — N179 Acute kidney failure, unspecified: Secondary | ICD-10-CM | POA: Insufficient documentation

## 2015-12-01 LAB — BASIC METABOLIC PANEL
ANION GAP: 10 (ref 5–15)
BUN: 55 mg/dL — ABNORMAL HIGH (ref 6–20)
CO2: 27 mmol/L (ref 22–32)
Calcium: 9 mg/dL (ref 8.9–10.3)
Chloride: 96 mmol/L — ABNORMAL LOW (ref 101–111)
Creatinine, Ser: 3.5 mg/dL — ABNORMAL HIGH (ref 0.44–1.00)
GFR, EST AFRICAN AMERICAN: 12 mL/min — AB (ref >60)
GFR, EST NON AFRICAN AMERICAN: 11 mL/min — AB (ref >60)
Glucose, Bld: 189 mg/dL — ABNORMAL HIGH (ref 65–99)
Potassium: 4.7 mmol/L (ref 3.5–5.1)
SODIUM: 133 mmol/L — AB (ref 135–145)

## 2015-12-01 MED ORDER — PREDNISONE 50 MG PO TABS
50.0000 mg | ORAL_TABLET | Freq: Every day | ORAL | Status: DC
  Administered 2015-12-02 – 2015-12-03 (×2): 50 mg via ORAL
  Filled 2015-12-01 (×2): qty 1

## 2015-12-01 NOTE — Progress Notes (Signed)
PROGRESS NOTE  [name] [id number] DOB: 10/15/1922 DOA: 11/29/2015 PCP: Odette Fraction, MD  Brief History:  82 year old female with a history of COPD, chronic respiratory failure on 2 L nasal cannula, chronic systolic CHF, CKD, hypertension, SSS s/p PPM presented with one-week history of progressive shortness of breath and malaise that had worsened in the 24 hours prior to admission. The patient's shortness of breath worsened to the point where she was unable to walk across a room in her house on the day of admission. The patient complained of a nonproductive cough without any fevers, chills, chest pain, nausea, vomiting, diarrhea. She states that she weighs herself regularly, and states that her weight has been stable. Chest x-ray was negative for any infiltrates or pulmonary edema. Workup in the ED suggested COPD exacerbation. The patient was started on intravenous steroids. Old records reviewed and summarized.  Assessment/Plan: Acute COPD exacerbation -Patient still has wheezing on examination -Switched to intravenous steroids-->transition to oral prednisone in am -Continue Pulmicort -Continue DuoNebs -The patient's family continues to smoke around the patient -Wean oxygen for saturation greater than 92% -personally reviewed CXR--no infiltrates, no edema/effusions  Acute on chronic--CKD stage IV -Baseline creatinine 2.1-2.4 -hold furosemide-->renal function continues to worsen -renal US -am BMP -may need renal consult if continues to worsen -give fluid challenge -D/C  lasix  Chronic systolic and diastolic CHF -Daily weights -Appears clinically euvolemic -Continue home dose furosemide -Continue carvedilol  Elevated troponin -No chest pain -Likely due to demand ischemia in the setting of CKD -Personally reviewed EKG--sinus, IVCD, TWI V3-V6  Hypertension -Continue amlodipine and carvedilol -BP stable  Gouty arthritis -Continue allopurinol -Decrease  dose to 100 mg daily  Tachybradycardia syndrome -Status post permanent pacemaker -Continue carvedilol -HR controlled  Disposition Plan: Home in 1-2 days  Family Communication: No Family at bedside--Total time spent 35 minutes. Greater than 50% spent face to face counseling and coordinating care. [id number]  Consultants: none  Code Status: FULL   DVT Prophylaxis: [place] Heparin   Procedures: As Listed in Progress Note Above  Antibiotics: None   Subjective: Patient denies fevers, chills, headache, chest pain, dyspnea, nausea, vomiting, diarrhea, abdominal pain, dysuria, hematuria.  Overall, the patient states that her breathing was better. Denies any coughing or hemoptysis.   Objective: Filed Vitals:   12/01/15 0352 12/01/15 0926 12/01/15 1227 12/01/15 1503  BP: 121/60  115/54   Pulse: 68  68   Temp: 97.7 F (36.5 C)  98.1 F (36.7 C)   TempSrc: Oral  Oral   Resp: 18  20   Height:      Weight: 95.664 kg (210 lb 14.4 oz)     SpO2: 98% 97% 96% 96%    Intake/Output Summary (Last 24 hours) at 12/01/15 1608 Last data filed at 12/01/15 1347  Gross per 24 hour  Intake    840 ml  Output    150 ml  Net    690 ml   Weight change: -1.86 kg (-4 lb 1.6 oz) Exam:   General:  Pt is alert, follows commands appropriately, not in acute distress  HEENT: No icterus, No thrush, No neck mass, [place]/AT  Cardiovascular: RRR, S1/S2, no rubs, no gallops  Respiratory: Bibasilar rales without wheezing. Good air movement.  Abdomen: Soft/+BS, non tender, non distended, no guarding  Extremities: trace LE edema, No lymphangitis, No petechiae, No rashes, no synovitis   Data Reviewed: I have personally reviewed following labs and imaging studies Basic  Metabolic Panel:  Recent Labs Lab 11/29/15 2335 11/30/15 1058 12/01/15 0934  NA 137 135 133*  K 4.2 4.4 4.7  CL 98* 99* 96*  CO2 30 28 27   GLUCOSE 104* 153* 189*  BUN 36* 42* 55*  CREATININE 2.47* 2.88* 3.50*  CALCIUM 9.1  8.9 9.0   Liver Function Tests:  Recent Labs Lab 11/29/15 2335  AST 18  ALT 9*  ALKPHOS 89  BILITOT 0.3  PROT 8.0  ALBUMIN 3.6    Recent Labs Lab 11/29/15 2335  LIPASE 48   No results for input(s): AMMONIA in the last 168 hours. Coagulation Profile: No results for input(s): INR, PROTIME in the last 168 hours. CBC:  Recent Labs Lab 11/29/15 2335 11/30/15 1058  WBC 9.2 4.6  HGB 11.6* 10.4*  HCT 36.2 32.9*  MCV 93.8 91.9  PLT 174 171   Cardiac Enzymes:  Recent Labs Lab 11/30/15 1058  TROPONINI 0.16*   BNP: Invalid input(s): POCBNP CBG: No results for input(s): GLUCAP in the last 168 hours. HbA1C: No results for input(s): HGBA1C in the last 72 hours. Urine analysis:    Component Value Date/Time   COLORURINE YELLOW 11/30/2015 0345   APPEARANCEUR CLEAR 11/30/2015 0345   LABSPEC 1.017 11/30/2015 0345   PHURINE 5.0 11/30/2015 0345   GLUCOSEU NEGATIVE 11/30/2015 0345   HGBUR NEGATIVE 11/30/2015 0345   BILIRUBINUR NEGATIVE 11/30/2015 0345   KETONESUR NEGATIVE 11/30/2015 0345   PROTEINUR NEGATIVE 11/30/2015 0345   UROBILINOGEN 0.2 11/13/2014 1239   NITRITE NEGATIVE 11/30/2015 0345   LEUKOCYTESUR NEGATIVE 11/30/2015 0345   Sepsis Labs: @LABRCNTIP (procalcitonin:4,lacticidven:4) )No results found for this or any previous visit (from the past 240 hour(s)).   Scheduled Meds: . allopurinol  100 mg Oral Daily  . amLODipine  5 mg Oral Daily  . aspirin EC  81 mg Oral Daily  . budesonide (PULMICORT) nebulizer solution  0.25 mg Nebulization BID  . carvedilol  25 mg Oral BID WC  . citalopram  10 mg Oral Daily  . docusate sodium  100 mg Oral BID  . ferrous sulfate  325 mg Oral Q breakfast  . heparin  5,000 Units Subcutaneous Q8H  . ipratropium-albuterol  3 mL Nebulization Q6H  . methylPREDNISolone (SOLU-MEDROL) injection  60 mg Intravenous Q12H  . pantoprazole  40 mg Oral Daily  . senna  2 tablet Oral Daily   Continuous Infusions:   Procedures/Studies: Dg  Abd Acute W/chest  11/30/2015  CLINICAL DATA:  Acute onset of shortness of breath, nausea and vomiting. Initial encounter. EXAM: DG ABDOMEN ACUTE W/ 1V CHEST COMPARISON:  Chest radiograph performed 07/23/2014 FINDINGS: The lungs are well-aerated. Mild right basilar and left midlung atelectasis are noted. There is no evidence of pleural effusion or pneumothorax. The cardiomediastinal silhouette is mildly enlarged. A pacemaker is noted overlying the right chest wall, with leads ending overlying the right ventricle. The visualized bowel gas pattern is unremarkable. Scattered stool and air are seen within the colon; there is no evidence of small bowel dilatation to suggest obstruction. No free intra-abdominal air is identified on the provided upright view. No acute osseous abnormalities are seen; the sacroiliac joints are unremarkable in appearance. IMPRESSION: 1. Unremarkable bowel gas pattern; no free intra-abdominal air seen. Small to moderate amount of stool noted in the colon. 2. Mild right basilar and left midlung atelectasis noted. 3. Mild cardiomegaly. Electronically Signed   By: Garald Balding M.D.   On: 11/30/2015 01:44    Kalayna Noy, DO  [facility] Pager [phone]  If 7PM-7AM, please contact night-coverage [email or web address] Password [email or web address] 12/01/2015, 4:08 PM   LOS: 1 day

## 2015-12-02 ENCOUNTER — Other Ambulatory Visit: Payer: Self-pay

## 2015-12-02 LAB — BASIC METABOLIC PANEL
ANION GAP: 8 (ref 5–15)
BUN: 62 mg/dL — AB (ref 6–20)
CO2: 29 mmol/L (ref 22–32)
Calcium: 8.8 mg/dL — ABNORMAL LOW (ref 8.9–10.3)
Chloride: 95 mmol/L — ABNORMAL LOW (ref 101–111)
Creatinine, Ser: 3.57 mg/dL — ABNORMAL HIGH (ref 0.44–1.00)
GFR calc Af Amer: 12 mL/min — ABNORMAL LOW (ref >60)
GFR, EST NON AFRICAN AMERICAN: 10 mL/min — AB (ref >60)
Glucose, Bld: 128 mg/dL — ABNORMAL HIGH (ref 65–99)
POTASSIUM: 4.6 mmol/L (ref 3.5–5.1)
SODIUM: 132 mmol/L — AB (ref 135–145)

## 2015-12-02 MED ORDER — CARVEDILOL 6.25 MG PO TABS
6.2500 mg | ORAL_TABLET | Freq: Two times a day (BID) | ORAL | Status: DC
  Administered 2015-12-02 – 2015-12-04 (×5): 6.25 mg via ORAL
  Filled 2015-12-02 (×5): qty 1

## 2015-12-02 MED ORDER — SODIUM CHLORIDE 0.9 % IV SOLN: INTRAVENOUS | Status: DC

## 2015-12-02 MED ORDER — ALLOPURINOL 100 MG PO TABS
100.0000 mg | ORAL_TABLET | Freq: Every day | ORAL | Status: DC
Start: 2015-12-02 — End: 2016-04-29

## 2015-12-02 MED ORDER — SODIUM CHLORIDE 0.9 % IV SOLN
INTRAVENOUS | Status: AC
  Administered 2015-12-02: 17:00:00 via INTRAVENOUS

## 2015-12-02 MED ORDER — IPRATROPIUM-ALBUTEROL 0.5-2.5 (3) MG/3ML IN SOLN
3.0000 mL | Freq: Four times a day (QID) | RESPIRATORY_TRACT | Status: DC
  Administered 2015-12-02 – 2015-12-03 (×5): 3 mL via RESPIRATORY_TRACT
  Filled 2015-12-02 (×5): qty 3

## 2015-12-02 NOTE — Discharge Summary (Signed)
Physician Discharge Summary  [name] [id number] DOB: June 27, 1922 DOA: 11/29/2015  PCP: Odette Fraction, MD  Admit date: 11/29/2015 Discharge date:  12/04/15  Admitted From:  HOME Disposition:  HOME  Recommendations for Outpatient Follow-up:  1. Follow up with PCP in 1-2 weeks 2. Please obtain BMP/CBC in one week   Home Health:  Equipment/Devices: 2 LNC  Discharge Condition: Stable CODE STATUS: FULL  Diet recommendation: Heart Healthy  Brief/Interim Summary: 82 year old female with a history of COPD, chronic respiratory failure on 2 L nasal cannula, chronic systolic CHF, CKD, hypertension, SSS s/p PPM presented with one-week history of progressive shortness of breath and malaise that had worsened in the 24 hours prior to admission. The patient's shortness of breath worsened to the point where she was unable to walk across a room in her house on the day of admission. The patient complained of a nonproductive cough without any fevers, chills, chest pain, nausea, vomiting, diarrhea. She states that she weighs herself regularly, and states that her weight has been stable. Chest x-ray was negative for any infiltrates or pulmonary edema. Workup in the ED suggested COPD exacerbation. The patient was started on intravenous steroids. Old records reviewed and summarized.  Discharge Diagnoses:  Acute COPD exacerbation/Chronic respiratory failure with hypoxia -Patient still has wheezing on examination -Switched to intravenous steroids-->transition to oral prednisone--home with prednisone taper -Continue Pulmicort -Continue DuoNebs -The patient's family continues to smoke around the patient -Wean oxygen for saturation greater than 92% -CXR--no infiltrates, no edema/effusions -stable on 2L which is her home oxygen level  Acute on chronic--CKD stage IV -Baseline creatinine 2.1-2.4 -Suspect due to volume depletion/hemodynamic changes as pt endorses approx 2-3 episodes of vomiting per  week at home after meals -12/01/2015 hold furosemide-->renal function worsened, then reached plateau -renal US--echogenic kidneys, no hydronephrosis -12/02/15 BMP--serum creatinine reached plateau 12/02/2015-->improving on 7/11 -may need renal consult if continues to worsen -give fluid challenge-->IVF (500cc) -D/C lasix-->resume home dose at time of discharge  Chronic systolic and diastolic CHF -Daily weights, gained 4 lbs-->resume lasix at time of d/c -Still Appears clinically euvolemic -Continue home dose furosemide at d/c-->initially held due to increasing creatinine -Continue carvedilol  Dysphagia/Vomiting -pt does not want endoscopy -no vomiting since admission  Elevated troponin -No chest pain -Likely due to demand ischemia in the setting of CKD -Personally reviewed EKG--sinus, IVCD, TWI V3-V6  Hypertension -Discontinue amlodipine due to soft blood pressure -Decreased dose carvedilol (6.25 mg bid) due to soft blood pressure  Gouty arthritis -Continue allopurinol -Decrease dose to 100 mg daily  Hyperkalemia -Kayexalate x 1 on 7/11  Tachybradycardia syndrome -Status post permanent pacemaker -Continue carvedilol -HR controlled   Discharge Instructions     Medication List    STOP taking these medications        amLODipine 5 MG tablet  Commonly known as:  NORVASC      TAKE these medications        albuterol (2.5 MG/3ML) 0.083% nebulizer solution  Commonly known as:  PROVENTIL  INHALE 1 VIAL VIA NEBULIZER EVERY 6 HOURS AS NEEDED FOR WHEEZING AND SHORTNESS OF BREATH     allopurinol 100 MG tablet  Commonly known as:  ZYLOPRIM  Take 1 tablet (100 mg total) by mouth daily.     aspirin 81 MG tablet  Take 1 tablet (81 mg total) by mouth daily.     carvedilol 6.25 MG tablet  Commonly known as:  COREG  TAKE 1 TABLET BY MOUTH TWICE DAILY WITH A MEAL  citalopram 10 MG tablet  Commonly known as:  CELEXA  TAKE 1 TABLET BY MOUTH DAILY     DIGOX 0.125 MG  tablet  Generic drug:  digoxin  TAKE ONE TABLET BY MOUTH DAILY     ferrous sulfate 325 (65 FE) MG EC tablet  Take 325 mg by mouth daily with breakfast.     furosemide 20 MG tablet  Commonly known as:  LASIX  TAKE ONE TABLET BY MOUTH TWO TIMES DAILY.     OXYGEN  Inhale 2 L into the lungs continuous. Reported on 11/29/2015     pantoprazole 40 MG tablet  Commonly known as:  PROTONIX  TAKE 1 TABLET BY MOUTH EVERY DAY     predniSONE 10 MG tablet  Commonly known as:  DELTASONE  Take 4 tablets (40 mg total) by mouth daily with breakfast. And decrease by one tablet daily  Start taking on:  12/05/2015       Follow-up Information    Follow up with [facility].   Why:  They will do your home health care at your home   Contact information:   [address] [place] [address] [phone]      No Known Allergies  Consultations:  none   Procedures/Studies: US Renal  12/01/2015  CLINICAL DATA:  82 year old female inpatient with acute renal failure superimposed on stage 4 chronic kidney disease. EXAM: RENAL / URINARY TRACT ULTRASOUND COMPLETE COMPARISON:  None. FINDINGS: Right Kidney: Length: 9.1 cm. Echogenic asymmetrically small right kidney with renal parenchymal atrophy. No right hydronephrosis. Lobulated and otherwise simple 2.4 x 2.4 x 2.2 cm renal cyst in the medial interpolar right kidney. Left Kidney: Length: 11.3 cm. Echogenic left kidney with renal parenchymal atrophy. No left hydronephrosis. Simple 3.9 x 3.4 x 3.6 cm renal cyst in the interpolar left kidney. Separate simple 2.6 x 2.3 x 2.7 cm renal cyst in the interpolar left kidney. Bladder: Collapsed and not well evaluated on this scan. IMPRESSION: 1. No hydronephrosis. 2. Echogenic mildly atrophic kidneys. 3. Benign-appearing renal cysts in both kidneys. 4. Bladder is collapsed and not well evaluated on this scan. Electronically Signed   By: Ilona Sorrel M.D.   On: 12/01/2015 16:13   Dg Abd Acute  W/chest  11/30/2015  CLINICAL DATA:  Acute onset of shortness of breath, nausea and vomiting. Initial encounter. EXAM: DG ABDOMEN ACUTE W/ 1V CHEST COMPARISON:  Chest radiograph performed 07/23/2014 FINDINGS: The lungs are well-aerated. Mild right basilar and left midlung atelectasis are noted. There is no evidence of pleural effusion or pneumothorax. The cardiomediastinal silhouette is mildly enlarged. A pacemaker is noted overlying the right chest wall, with leads ending overlying the right ventricle. The visualized bowel gas pattern is unremarkable. Scattered stool and air are seen within the colon; there is no evidence of small bowel dilatation to suggest obstruction. No free intra-abdominal air is identified on the provided upright view. No acute osseous abnormalities are seen; the sacroiliac joints are unremarkable in appearance. IMPRESSION: 1. Unremarkable bowel gas pattern; no free intra-abdominal air seen. Small to moderate amount of stool noted in the colon. 2. Mild right basilar and left midlung atelectasis noted. 3. Mild cardiomegaly. Electronically Signed   By: Garald Balding M.D.   On: 11/30/2015 01:44        Discharge Exam: Filed Vitals:   12/03/15 0916 12/03/15 1049  BP:  119/57  Pulse: 70 67  Temp:    Resp: 18    Filed Vitals:  12/02/15 2021 12/03/15 0544 12/03/15 0916 12/03/15 1049  BP: 136/53 138/56  119/57  Pulse: 69 70 70 67  Temp: 97.5 F (36.4 C) 97.8 F (36.6 C)    TempSrc: Oral Oral    Resp: 18 18 18    Height:      Weight:  97.977 kg (216 lb)    SpO2: 100% 100% 100%     General: Pt is alert, awake, not in acute distress Cardiovascular: RRR, S1/S2 +, no rubs, no gallops Respiratory: bibasilar rales, no wheeze Abdominal: Soft, NT, ND, bowel sounds + Extremities: no edema, no cyanosis   The results of significant diagnostics from this hospitalization (including imaging, microbiology, ancillary and laboratory) are listed below for reference.    Significant  Diagnostic Studies: US Renal  12/01/2015  CLINICAL DATA:  82 year old female inpatient with acute renal failure superimposed on stage 4 chronic kidney disease. EXAM: RENAL / URINARY TRACT ULTRASOUND COMPLETE COMPARISON:  None. FINDINGS: Right Kidney: Length: 9.1 cm. Echogenic asymmetrically small right kidney with renal parenchymal atrophy. No right hydronephrosis. Lobulated and otherwise simple 2.4 x 2.4 x 2.2 cm renal cyst in the medial interpolar right kidney. Left Kidney: Length: 11.3 cm. Echogenic left kidney with renal parenchymal atrophy. No left hydronephrosis. Simple 3.9 x 3.4 x 3.6 cm renal cyst in the interpolar left kidney. Separate simple 2.6 x 2.3 x 2.7 cm renal cyst in the interpolar left kidney. Bladder: Collapsed and not well evaluated on this scan. IMPRESSION: 1. No hydronephrosis. 2. Echogenic mildly atrophic kidneys. 3. Benign-appearing renal cysts in both kidneys. 4. Bladder is collapsed and not well evaluated on this scan. Electronically Signed   By: Ilona Sorrel M.D.   On: 12/01/2015 16:13   Dg Abd Acute W/chest  11/30/2015  CLINICAL DATA:  Acute onset of shortness of breath, nausea and vomiting. Initial encounter. EXAM: DG ABDOMEN ACUTE W/ 1V CHEST COMPARISON:  Chest radiograph performed 07/23/2014 FINDINGS: The lungs are well-aerated. Mild right basilar and left midlung atelectasis are noted. There is no evidence of pleural effusion or pneumothorax. The cardiomediastinal silhouette is mildly enlarged. A pacemaker is noted overlying the right chest wall, with leads ending overlying the right ventricle. The visualized bowel gas pattern is unremarkable. Scattered stool and air are seen within the colon; there is no evidence of small bowel dilatation to suggest obstruction. No free intra-abdominal air is identified on the provided upright view. No acute osseous abnormalities are seen; the sacroiliac joints are unremarkable in appearance. IMPRESSION: 1. Unremarkable bowel gas pattern; no free  intra-abdominal air seen. Small to moderate amount of stool noted in the colon. 2. Mild right basilar and left midlung atelectasis noted. 3. Mild cardiomegaly. Electronically Signed   By: Garald Balding M.D.   On: 11/30/2015 01:44     Microbiology: No results found for this or any previous visit (from the past 240 hour(s)).   Labs: Basic Metabolic Panel:  Recent Labs Lab 11/29/15 2335 11/30/15 1058 12/01/15 0934 12/02/15 0400 12/03/15 1116  NA 137 135 133* 132* 131*  K 4.2 4.4 4.7 4.6 5.2*  CL 98* 99* 96* 95* 95*  CO2 30 28 27 29 28   GLUCOSE 104* 153* 189* 128* 94  BUN 36* 42* 55* 62* 62*  CREATININE 2.47* 2.88* 3.50* 3.57* 2.89*  CALCIUM 9.1 8.9 9.0 8.8* 8.6*   Liver Function Tests:  Recent Labs Lab 11/29/15 2335  AST 18  ALT 9*  ALKPHOS 89  BILITOT 0.3  PROT 8.0  ALBUMIN 3.6    Recent  Labs Lab 11/29/15 2335  LIPASE 48   No results for input(s): AMMONIA in the last 168 hours. CBC:  Recent Labs Lab 11/29/15 2335 11/30/15 1058  WBC 9.2 4.6  HGB 11.6* 10.4*  HCT 36.2 32.9*  MCV 93.8 91.9  PLT 174 171   Cardiac Enzymes:  Recent Labs Lab 11/30/15 1058  TROPONINI 0.16*   BNP: Invalid input(s): POCBNP CBG: No results for input(s): GLUCAP in the last 168 hours.  Time coordinating discharge:  Greater than 30 minutes  Signed:  Adam Demary, DO [facility] Pager: [phone] 12/03/2015, 3:54 PM

## 2015-12-02 NOTE — Care Management Important Message (Signed)
Important Message  Patient Details  Name: [name] MRN: [id number] Date of Birth: 02-Jul-1922   Medicare Important Message Given:  Yes    Omni Dunsworth, Leroy Sea 12/02/2015, 8:22 AM

## 2015-12-02 NOTE — Progress Notes (Signed)
PROGRESS NOTE  [name] [id number] DOB: [birth year] DOA: 11/29/2015 PCP: Odette Fraction, MD  Brief History:  82 year old female with a history of COPD, chronic respiratory failure on 2 L nasal cannula, chronic systolic CHF, CKD, hypertension, SSS s/p PPM presented with one-week history of progressive shortness of breath and malaise that had worsened in the 24 hours prior to admission. The patient's shortness of breath worsened to the point where she was unable to walk across a room in her house on the day of admission. The patient complained of a nonproductive cough without any fevers, chills, chest pain, nausea, vomiting, diarrhea. She states that she weighs herself regularly, and states that her weight has been stable. Chest x-ray was negative for any infiltrates or pulmonary edema. Workup in the ED suggested COPD exacerbation. The patient was started on intravenous steroids. Old records reviewed and summarized.  Assessment/Plan: Acute COPD exacerbation -Patient still has wheezing on examination -Switched to intravenous steroids-->transition to oral prednisone in am -Continue Pulmicort -Continue DuoNebs -The patient's family continues to smoke around the patient -Wean oxygen for saturation greater than 92% -personally reviewed CXR--no infiltrates, no edema/effusions  Acute on chronic--CKD stage IV -Baseline creatinine 2.1-2.4 -Suspect due to volume depletion or hemodynamic changes -12/01/2015 hold furosemide-->renal function continues to worsen -renal US--echogenic kidneys, no hydronephrosis -am BMP--serum creatinine appears to be reaching plateau 12/02/2015 -may need renal consult if continues to worsen -give fluid challenge-->IVF -D/C lasix  Chronic systolic and diastolic CHF -Daily weights -Appears clinically euvolemic -Continue home dose furosemide initially-->held due to increasing creatinine -Continue carvedilol  Dysphagia/Vomiting -pt does not want  endoscopy -no vomiting since admit  Elevated troponin -No chest pain -Likely due to demand ischemia in the setting of CKD -Personally reviewed EKG--sinus, IVCD, TWI V3-V6  Hypertension -Discontinue amlodipine due to soft blood pressure -Decreased dose carvedilol due to soft blood pressure  Gouty arthritis -Continue allopurinol -Decrease dose to 100 mg daily  Tachybradycardia syndrome -Status post permanent pacemaker -Continue carvedilol -HR controlled  Disposition Plan: Home in 1-2 days  Family Communication: No Family at bedside--Total time spent 35 minutes. Greater than 50% spent face to face counseling and coordinating care. [id number]  Consultants: none  Code Status: FULL   DVT Prophylaxis:  Heparin    Subjective: Patient is breathing better. She denies any fevers, chills, headache, chest pain, shortness breath, nausea, vomiting, diarrhea, abdominal pain. The patient states that she has had intermittent episodes of vomiting with meals at home which has been happening for many years. She denies any odynophagia. She does not want endoscopy.  Objective: Filed Vitals:   12/02/15 0856 12/02/15 1211 12/02/15 1330 12/02/15 1541  BP:   97/47   Pulse:   69   Temp:   97.8 F (36.6 C)   TempSrc:   Oral   Resp:   18   Height:      Weight:      SpO2: 99% 98% 98% 98%    Intake/Output Summary (Last 24 hours) at 12/02/15 1655 Last data filed at 12/02/15 1330  Gross per 24 hour  Intake   1080 ml  Output    400 ml  Net    680 ml   Weight change: 0.635 kg (1 lb 6.4 oz) Exam:   General:  Pt is alert, follows commands appropriately, not in acute distress  HEENT: No icterus, No thrush, No neck mass, [place]/AT  Cardiovascular: RRR, S1/S2, no rubs, no gallops  Respiratory: Bibasilar  rales without wheezing. Good air movement.  Abdomen: Soft/+BS, non tender, non distended, no guarding  Extremities: trace LE edema, No lymphangitis, No petechiae, No rashes, no  synovitis   Data Reviewed: I have personally reviewed following labs and imaging studies Basic Metabolic Panel:  Recent Labs Lab 11/29/15 2335 11/30/15 1058 12/01/15 0934 12/02/15 0400  NA 137 135 133* 132*  K 4.2 4.4 4.7 4.6  CL 98* 99* 96* 95*  CO2 30 28 27 29   GLUCOSE 104* 153* 189* 128*  BUN 36* 42* 55* 62*  CREATININE 2.47* 2.88* 3.50* 3.57*  CALCIUM 9.1 8.9 9.0 8.8*   Liver Function Tests:  Recent Labs Lab 11/29/15 2335  AST 18  ALT 9*  ALKPHOS 89  BILITOT 0.3  PROT 8.0  ALBUMIN 3.6    Recent Labs Lab 11/29/15 2335  LIPASE 48   No results for input(s): AMMONIA in the last 168 hours. Coagulation Profile: No results for input(s): INR, PROTIME in the last 168 hours. CBC:  Recent Labs Lab 11/29/15 2335 11/30/15 1058  WBC 9.2 4.6  HGB 11.6* 10.4*  HCT 36.2 32.9*  MCV 93.8 91.9  PLT 174 171   Cardiac Enzymes:  Recent Labs Lab 11/30/15 1058  TROPONINI 0.16*   BNP: Invalid input(s): POCBNP CBG: No results for input(s): GLUCAP in the last 168 hours. HbA1C: No results for input(s): HGBA1C in the last 72 hours. Urine analysis:    Component Value Date/Time   COLORURINE YELLOW 11/30/2015 0345   APPEARANCEUR CLEAR 11/30/2015 0345   LABSPEC 1.017 11/30/2015 0345   PHURINE 5.0 11/30/2015 0345   GLUCOSEU NEGATIVE 11/30/2015 0345   HGBUR NEGATIVE 11/30/2015 0345   BILIRUBINUR NEGATIVE 11/30/2015 0345   KETONESUR NEGATIVE 11/30/2015 0345   PROTEINUR NEGATIVE 11/30/2015 0345   UROBILINOGEN 0.2 11/13/2014 1239   NITRITE NEGATIVE 11/30/2015 0345   LEUKOCYTESUR NEGATIVE 11/30/2015 0345   Sepsis Labs: @LABRCNTIP (procalcitonin:4,lacticidven:4) )No results found for this or any previous visit (from the past 240 hour(s)).   Scheduled Meds: . allopurinol  100 mg Oral Daily  . amLODipine  5 mg Oral Daily  . aspirin EC  81 mg Oral Daily  . budesonide (PULMICORT) nebulizer solution  0.25 mg Nebulization BID  . carvedilol  25 mg Oral BID WC  .  citalopram  10 mg Oral Daily  . docusate sodium  100 mg Oral BID  . ferrous sulfate  325 mg Oral Q breakfast  . heparin  5,000 Units Subcutaneous Q8H  . ipratropium-albuterol  3 mL Nebulization QID  . pantoprazole  40 mg Oral Daily  . predniSONE  50 mg Oral Q breakfast  . senna  2 tablet Oral Daily   Continuous Infusions:   Procedures/Studies: US Renal  12/01/2015  CLINICAL DATA:  82 year old female inpatient with acute renal failure superimposed on stage 4 chronic kidney disease. EXAM: RENAL / URINARY TRACT ULTRASOUND COMPLETE COMPARISON:  None. FINDINGS: Right Kidney: Length: 9.1 cm. Echogenic asymmetrically small right kidney with renal parenchymal atrophy. No right hydronephrosis. Lobulated and otherwise simple 2.4 x 2.4 x 2.2 cm renal cyst in the medial interpolar right kidney. Left Kidney: Length: 11.3 cm. Echogenic left kidney with renal parenchymal atrophy. No left hydronephrosis. Simple 3.9 x 3.4 x 3.6 cm renal cyst in the interpolar left kidney. Separate simple 2.6 x 2.3 x 2.7 cm renal cyst in the interpolar left kidney. Bladder: Collapsed and not well evaluated on this scan. IMPRESSION: 1. No hydronephrosis. 2. Echogenic mildly atrophic kidneys. 3. Benign-appearing renal cysts in both kidneys.  4. Bladder is collapsed and not well evaluated on this scan. Electronically Signed   By: Ilona Sorrel M.D.   On: 12/01/2015 16:13   Dg Abd Acute W/chest  11/30/2015  CLINICAL DATA:  Acute onset of shortness of breath, nausea and vomiting. Initial encounter. EXAM: DG ABDOMEN ACUTE W/ 1V CHEST COMPARISON:  Chest radiograph performed 07/23/2014 FINDINGS: The lungs are well-aerated. Mild right basilar and left midlung atelectasis are noted. There is no evidence of pleural effusion or pneumothorax. The cardiomediastinal silhouette is mildly enlarged. A pacemaker is noted overlying the right chest wall, with leads ending overlying the right ventricle. The visualized bowel gas pattern is unremarkable.  Scattered stool and air are seen within the colon; there is no evidence of small bowel dilatation to suggest obstruction. No free intra-abdominal air is identified on the provided upright view. No acute osseous abnormalities are seen; the sacroiliac joints are unremarkable in appearance. IMPRESSION: 1. Unremarkable bowel gas pattern; no free intra-abdominal air seen. Small to moderate amount of stool noted in the colon. 2. Mild right basilar and left midlung atelectasis noted. 3. Mild cardiomegaly. Electronically Signed   By: Garald Balding M.D.   On: 11/30/2015 01:44    Debbe Crumble, DO  [facility] Pager [phone]  If 7PM-7AM, please contact night-coverage [email or web address] Password [email or web address] 12/02/2015, 4:55 PM   LOS: 2 days

## 2015-12-02 NOTE — Care Management Note (Signed)
Case Management Note  Patient Details  Name: [name] MRN: [id number] Date of Birth: Mar 28, 1923  Subjective/Objective:         Admitted with COPD           Action/Plan: Patient lives at home with her grand daughter. PCP is PICKARD,WARREN TOM, MD; She has private insurance with [facility] with prescription drug coverage; patient cannot remember the name of her pharmacy but stated that it is delivered to her home. The patient and her daughter does not cook and she eats out a lot. Patient stated " I eat at every restaurant." She has home oxygen, nebulizer machine at home and is requesting a walker with wheels at discharge ( CM will order). CM asked if her family is smoking in her presence with oxygen; patient stated "my grand daughter (61 yr old) smokes in my home and I told her not to. CM to talk to family when they visit about smoking in the use of oxygen. Patient could benefit from a Disease Management Program for COPD at discharge.   Expected Discharge Date:     Possibly in 1-2 days             Expected Discharge Plan:  [place]  In-House Referral:   [facility] COPD  Discharge planning Services  CM Consult  Post Acute Care Choice:    Choice offered to:  Patient  DME Arranged:    Rolling walker DME Agency:   [facility]  HH Arranged:   TBD HH Agency:   TBD  Status of Service:  In process, will continue to follow   Sherrilyn Rist [id number] 12/02/2015, 10:07 AM

## 2015-12-02 NOTE — Consult Note (Signed)
   [facility] CM Inpatient Consult   12/02/2015  [name] 23-Apr-1923 [id number]  Patient screened for potential [facility] Management services. Patient is eligible for [facility] Care Management services under patient's [place] Medicare  plan.  Patient is a 82 year old female with a history of COPD, chronic respiratory failure on 2 L nasal cannula, chronic systolic HF, CKD, hypertension, SSS. Patient admitted with one-week history of progressive shortness of breath and malaise that had worsened in the 24 hours prior to admission.  Met with patient and daughter [name] at the bedside.  Daughter states that the patient's granddaughter and great grandson lives with the patient but everyone works.  She states that the patient often calls EMS because of shortness of breath and the need for frequent re-assurance.  Chart review reveals that the inpatient RNCM has assigned the patient to [facility] EMMI calls for follow up.  Daughter states she would like the home follow up as well after reviewing the brochure.  Daughter states, "I don't know how to check her blood pressure or know how her breathing really is when she gets anxious.  I would like some follow up when she gets back home."  Will follow up on progression.  Daughter states that the patient may need a higher level of care but is not sure of disposition at this time. Consent form signed and [facility] folder given.   Patient states, "I feel so much better than I did when I came in here."  Please place a [facility] Care Management consult or for questions contact:   Natividad Brood, RN BSN [facility] Liaison  [phone] business mobile phone Toll free office [phone]

## 2015-12-03 LAB — BASIC METABOLIC PANEL WITH GFR
Anion gap: 8 (ref 5–15)
BUN: 62 mg/dL — ABNORMAL HIGH (ref 6–20)
CO2: 28 mmol/L (ref 22–32)
Calcium: 8.6 mg/dL — ABNORMAL LOW (ref 8.9–10.3)
Chloride: 95 mmol/L — ABNORMAL LOW (ref 101–111)
Creatinine, Ser: 2.89 mg/dL — ABNORMAL HIGH (ref 0.44–1.00)
GFR calc Af Amer: 15 mL/min — ABNORMAL LOW
GFR calc non Af Amer: 13 mL/min — ABNORMAL LOW
Glucose, Bld: 94 mg/dL (ref 65–99)
Potassium: 5.2 mmol/L — ABNORMAL HIGH (ref 3.5–5.1)
Sodium: 131 mmol/L — ABNORMAL LOW (ref 135–145)

## 2015-12-03 MED ORDER — FERROUS SULFATE 325 (65 FE) MG PO TABS
325.0000 mg | ORAL_TABLET | Freq: Every day | ORAL | Status: DC
  Administered 2015-12-03 – 2015-12-04 (×2): 325 mg via ORAL
  Filled 2015-12-03 (×2): qty 1

## 2015-12-03 MED ORDER — IPRATROPIUM-ALBUTEROL 0.5-2.5 (3) MG/3ML IN SOLN
3.0000 mL | Freq: Two times a day (BID) | RESPIRATORY_TRACT | Status: DC
  Administered 2015-12-03 – 2015-12-04 (×2): 3 mL via RESPIRATORY_TRACT
  Filled 2015-12-03 (×2): qty 3

## 2015-12-03 MED ORDER — SODIUM POLYSTYRENE SULFONATE 15 GM/60ML PO SUSP
30.0000 g | Freq: Once | ORAL | Status: AC
  Administered 2015-12-03: 30 g via ORAL
  Filled 2015-12-03: qty 120

## 2015-12-03 MED ORDER — CARVEDILOL 6.25 MG PO TABS: ORAL_TABLET | ORAL | Status: DC

## 2015-12-03 MED ORDER — PREDNISONE 20 MG PO TABS
40.0000 mg | ORAL_TABLET | Freq: Every day | ORAL | Status: DC
  Administered 2015-12-04: 40 mg via ORAL
  Filled 2015-12-03: qty 2

## 2015-12-03 MED ORDER — PREDNISONE 10 MG PO TABS: 40.0000 mg | ORAL_TABLET | Freq: Every day | ORAL | Status: DC

## 2015-12-03 NOTE — Progress Notes (Signed)
Talked to patient with granddaughter present about [place] choices, granddaughter requested [place]; Butch Penny with [facility] called for arrangements;  CM talked to grand daughter about smoking around the patient while she is using home oxygen, she stated that she was not smoking around her and she is trying to quit smoking. Mindi Slicker [facility] [phone]

## 2015-12-03 NOTE — Evaluation (Signed)
Physical Therapy Evaluation Patient Details Name: ROLANDE SJOSTROM MRN: [id number] DOB: [birth year] Today's Date: 12/03/2015   History of Present Illness  82 year old female with a history of COPD, chronic respiratory failure on 2 L nasal cannula, chronic systolic CHF, CKD, hypertension, SSS s/p PPM presented with one-week history of progressive shortness of breath   Clinical Impression  Patient presents with decreased independence with mobility due to weakness and issues noted in PT problem list.  She will benefit from skilled PT in the acute setting to allow return home with family support and follow up HHPT.      Follow Up Recommendations Home health PT;Supervision/Assistance - 24 hour    Equipment Recommendations  Rolling walker with 5" wheels (fell with rollator and now using Std walker, but difficult)    Recommendations for Other Services       Precautions / Restrictions Precautions Precautions: Fall Precaution Comments: O2 dependent      Mobility  Bed Mobility Overal bed mobility: Needs Assistance Bed Mobility: Supine to Sit;Sit to Supine     Supine to sit: Supervision Sit to supine: Supervision   General bed mobility comments: cues for positioning  Transfers Overall transfer level: Needs assistance Equipment used: Rolling walker (2 wheeled) Transfers: Sit to/from [facility] Sit to Stand: Min guard         General transfer comment: for balance to stand, for safety with set up with pivot to [facility] with RW  Ambulation/Gait Ambulation/Gait assistance: Min guard Ambulation Distance (Feet): 140 Feet Assistive device: Rolling walker (2 wheeled) Gait Pattern/deviations: Step-through pattern;Trunk flexed     General Gait Details: assist with walker on turns  Stairs            Wheelchair Mobility    Modified Rankin (Stroke Patients Only)       Balance Overall balance assessment: Needs assistance   Sitting balance-Leahy Scale: Good     Standing balance support: No upper extremity supported;During functional activity Standing balance-Leahy Scale: Poor Standing balance comment: assist for balance in standing during hygiene after toileting                             Pertinent Vitals/Pain Pain Assessment: No/denies pain    Home Living Family/patient expects to be discharged to:: Private residence Living Arrangements: Children Available Help at Discharge: Family;Available 24 hours/day Type of Home: Apartment Home Access: Level entry     Home Layout: One level Home Equipment: Bedside commode;Walker - 4 wheels;Walker - standard;Shower seat Additional Comments: has PCS aide that comes daily 2 hours in morning to help with breakfast meal, light housework, nebulizer and occasionally bathing, grandaughter works, 80 y/o grandson there during the day    Prior Function Level of Independence: Needs assistance   Gait / Transfers Assistance Needed: independent with walker for ambulation; almost fell with rollator so now using standard walker  ADL's / Homemaking Assistance Needed: assist for homemaking and some ADL's        Hand Dominance   Dominant Hand: Right    Extremity/Trunk Assessment               Lower Extremity Assessment: Generalized weakness         Communication   Communication: No difficulties  Cognition Arousal/Alertness: Awake/alert Behavior During Therapy: WFL for tasks assessed/performed Overall Cognitive Status: Within Functional Limits for tasks assessed  General Comments General comments (skin integrity, edema, etc.): HR 71, SpO2 95% on 2L after ambulation    Exercises        Assessment/Plan    PT Assessment Patient needs continued PT services  PT Diagnosis Generalized weakness;Abnormality of gait   PT Problem List Decreased mobility;Decreased balance;Decreased strength;Decreased knowledge of use of DME  PT Treatment Interventions DME  instruction;Gait training;Functional mobility training;Therapeutic exercise;Therapeutic activities;Balance training;Patient/family education   PT Goals (Current goals can be found in the Care Plan section) Acute Rehab PT Goals Patient Stated Goal: To return home PT Goal Formulation: With patient Time For Goal Achievement: 12/10/15 Potential to Achieve Goals: Good    Frequency Min 3X/week   Barriers to discharge        Co-evaluation               End of Session Equipment Utilized During Treatment: Gait belt;Oxygen Activity Tolerance: Patient tolerated treatment well Patient left: in bed;with call bell/phone within reach           Time: 1003-1038 PT Time Calculation (min) (ACUTE ONLY): 35 min   Charges:   PT Evaluation $PT Eval Moderate Complexity: 1 Procedure PT Treatments $Gait Training: 8-22 mins   PT G CodesReginia Naas 2015/12/31, 10:52 AM  Magda Kiel, [place] 2015-12-31

## 2015-12-04 DIAGNOSIS — N184 Chronic kidney disease, stage 4 (severe): Secondary | ICD-10-CM

## 2015-12-04 DIAGNOSIS — N179 Acute kidney failure, unspecified: Secondary | ICD-10-CM

## 2015-12-04 DIAGNOSIS — J441 Chronic obstructive pulmonary disease with (acute) exacerbation: Principal | ICD-10-CM

## 2015-12-04 DIAGNOSIS — I5042 Chronic combined systolic (congestive) and diastolic (congestive) heart failure: Secondary | ICD-10-CM

## 2015-12-04 LAB — BASIC METABOLIC PANEL
Anion gap: 8 (ref 5–15)
BUN: 56 mg/dL — AB (ref 6–20)
CHLORIDE: 99 mmol/L — AB (ref 101–111)
CO2: 28 mmol/L (ref 22–32)
Calcium: 8.6 mg/dL — ABNORMAL LOW (ref 8.9–10.3)
Creatinine, Ser: 2.37 mg/dL — ABNORMAL HIGH (ref 0.44–1.00)
GFR calc non Af Amer: 17 mL/min — ABNORMAL LOW (ref >60)
GFR, EST AFRICAN AMERICAN: 19 mL/min — AB (ref >60)
Glucose, Bld: 81 mg/dL (ref 65–99)
POTASSIUM: 4.4 mmol/L (ref 3.5–5.1)
SODIUM: 135 mmol/L (ref 135–145)

## 2015-12-04 MED ORDER — PREDNISONE 10 MG PO TABS: 40.0000 mg | ORAL_TABLET | Freq: Every day | ORAL | Status: DC

## 2015-12-04 MED ORDER — BUDESONIDE-FORMOTEROL FUMARATE 160-4.5 MCG/ACT IN AERO: 2.0000 | INHALATION_SPRAY | Freq: Two times a day (BID) | RESPIRATORY_TRACT | Status: DC

## 2015-12-04 MED ORDER — TIOTROPIUM BROMIDE MONOHYDRATE 18 MCG IN CAPS: 18.0000 ug | ORAL_CAPSULE | Freq: Every day | RESPIRATORY_TRACT | Status: DC

## 2015-12-04 NOTE — Progress Notes (Signed)
All d/c instructions explained and given to pt and her daughter.  Verbalized understanding.  Karie Kirks, Therapist, sports.

## 2015-12-04 NOTE — Progress Notes (Signed)
RT came to do neb txs- pt unavailable at this time. Pt states she wants to finish eating

## 2015-12-04 NOTE — Discharge Summary (Signed)
Physician Discharge Summary  [name] [id number] DOB: [birth year] DOA: 11/29/2015  PCP: Odette Fraction, MD  Admit date: 11/29/2015 Discharge date: 12/04/2015  Time spent: Greater than 30 minutes  Recommendations for Outpatient Follow-up:  1. Follow up with PCP within one week of discharge.   Discharge Diagnoses:  Principal Problem:   COPD exacerbation ([place]) Active Problems:   Essential hypertension   Tachy-brady syndrome (HCC)   Chronic systolic CHF (congestive heart failure) (HCC)   CKD (chronic kidney disease), stage IV (HCC)   Normocytic anemia   Elevated troponin   Chronic combined systolic and diastolic CHF (congestive heart failure) (HCC)   Acute renal failure superimposed on stage 4 chronic kidney disease ([place])   Discharge Condition: Stable  Diet recommendation: Cardiac diet  Filed Weights   12/02/15 0608 12/03/15 0544 12/04/15 0530  Weight: 96.299 kg (212 lb 4.8 oz) 97.977 kg (216 lb) 97.387 kg (214 lb 11.2 oz)    History of present illness: 82 year old female with past medical history significant for COPD on home O2 2L, CHF EF 45% who presents with dyspnea for one day. Patient described that she was able to walk around her house without shortness of breath prior to onset of symptoms. Despite receiving breathing treatment at home, the patient's symptoms persisted.   Hospital Course: Patient was admitted for further assessment and management of COPD with exacerbation. Patient was managed with steroids and neb treatment. With above treatment, the patient's symptoms resolved, and patient is back to her usual self. Patient is eager to be discharged back home today.  Procedures:  None  Consultations:  None  Discharge Exam: Filed Vitals:   12/04/15 0530 12/04/15 1157  BP: 135/60 128/68  Pulse: 89 73  Temp: 98.1 F (36.7 C) 97.7 F (36.5 C)  Resp: 18 20    General: AAO X 3. Not in distress Cardiovascular: S1S2 Respiratory: Mildly decreased air entry  globally.  Discharge Instructions   Discharge Instructions    Diet - low sodium heart healthy    Complete by:  As directed      Discharge instructions    Complete by:  As directed   Follow up with PCP within one week of discharge     Increase activity slowly    Complete by:  As directed           Current Discharge Medication List    START taking these medications   Details  budesonide-formoterol (SYMBICORT) 160-4.5 MCG/ACT inhaler Inhale 2 puffs into the lungs 2 (two) times daily. Qty: 1 Inhaler, Refills: 12    predniSONE (DELTASONE) 10 MG tablet Take 4 tablets (40 mg total) by mouth daily with breakfast. Prednisone 40mg  po daily for 3 days, then 30mg  po daily for 3 days, then 20mg  po daily for 3 days and then 10mg  po daily for 3 days and stop Qty: 6 tablet, Refills: 0    tiotropium (SPIRIVA HANDIHALER) 18 MCG inhalation capsule Place 1 capsule (18 mcg total) into inhaler and inhale daily. Qty: 30 capsule, Refills: 2      CONTINUE these medications which have CHANGED   Details  allopurinol (ZYLOPRIM) 100 MG tablet Take 1 tablet (100 mg total) by mouth daily. Qty: 30 tablet, Refills: 1    carvedilol (COREG) 6.25 MG tablet TAKE 1 TABLET BY MOUTH TWICE DAILY WITH A MEAL Qty: 60 tablet, Refills: 0      CONTINUE these medications which have NOT CHANGED   Details  albuterol (PROVENTIL) (2.5 MG/3ML) 0.083% nebulizer  solution INHALE 1 VIAL VIA NEBULIZER EVERY 6 HOURS AS NEEDED FOR WHEEZING AND SHORTNESS OF BREATH Qty: 150 mL, Refills: 4    aspirin 81 MG tablet Take 1 tablet (81 mg total) by mouth daily. Qty: 30 tablet, Refills: 11    citalopram (CELEXA) 10 MG tablet TAKE 1 TABLET BY MOUTH DAILY Qty: 30 tablet, Refills: 11    ferrous sulfate 325 (65 FE) MG EC tablet Take 325 mg by mouth daily with breakfast.    furosemide (LASIX) 20 MG tablet TAKE ONE TABLET BY MOUTH TWO TIMES DAILY. Qty: 60 tablet, Refills: 4    pantoprazole (PROTONIX) 40 MG tablet TAKE 1 TABLET BY  MOUTH EVERY DAY Qty: 30 tablet, Refills: 11    DIGOX 125 MCG tablet TAKE ONE TABLET BY MOUTH DAILY Qty: 30 tablet, Refills: 3    OXYGEN Inhale 2 L into the lungs continuous. Reported on 11/29/2015      STOP taking these medications     amLODipine (NORVASC) 5 MG tablet        No Known Allergies Follow-up Information    Follow up with [facility].   Why:  They will do your home health care at your home   Contact information:   [address] [place] [address] [phone]       Follow up with [facility] TOM, MD In 1 week.   Specialty:  Family Medicine   Why:  Post hospitalization   Contact information:   [id number] [place] [address] [phone]        The results of significant diagnostics from this hospitalization (including imaging, microbiology, ancillary and laboratory) are listed below for reference.    Significant Diagnostic Studies: US Renal  12/01/2015  CLINICAL DATA:  82 year old female inpatient with acute renal failure superimposed on stage 4 chronic kidney disease. EXAM: RENAL / URINARY TRACT ULTRASOUND COMPLETE COMPARISON:  None. FINDINGS: Right Kidney: Length: 9.1 cm. Echogenic asymmetrically small right kidney with renal parenchymal atrophy. No right hydronephrosis. Lobulated and otherwise simple 2.4 x 2.4 x 2.2 cm renal cyst in the medial interpolar right kidney. Left Kidney: Length: 11.3 cm. Echogenic left kidney with renal parenchymal atrophy. No left hydronephrosis. Simple 3.9 x 3.4 x 3.6 cm renal cyst in the interpolar left kidney. Separate simple 2.6 x 2.3 x 2.7 cm renal cyst in the interpolar left kidney. Bladder: Collapsed and not well evaluated on this scan. IMPRESSION: 1. No hydronephrosis. 2. Echogenic mildly atrophic kidneys. 3. Benign-appearing renal cysts in both kidneys. 4. Bladder is collapsed and not well evaluated on this scan. Electronically Signed   By: Ilona Sorrel M.D.   On: 12/01/2015 16:13    Dg Abd Acute W/chest  11/30/2015  CLINICAL DATA:  Acute onset of shortness of breath, nausea and vomiting. Initial encounter. EXAM: DG ABDOMEN ACUTE W/ 1V CHEST COMPARISON:  Chest radiograph performed 07/23/2014 FINDINGS: The lungs are well-aerated. Mild right basilar and left midlung atelectasis are noted. There is no evidence of pleural effusion or pneumothorax. The cardiomediastinal silhouette is mildly enlarged. A pacemaker is noted overlying the right chest wall, with leads ending overlying the right ventricle. The visualized bowel gas pattern is unremarkable. Scattered stool and air are seen within the colon; there is no evidence of small bowel dilatation to suggest obstruction. No free intra-abdominal air is identified on the provided upright view. No acute osseous abnormalities are seen; the sacroiliac joints are unremarkable in appearance. IMPRESSION: 1. Unremarkable bowel gas pattern; no  free intra-abdominal air seen. Small to moderate amount of stool noted in the colon. 2. Mild right basilar and left midlung atelectasis noted. 3. Mild cardiomegaly. Electronically Signed   By: Garald Balding M.D.   On: 11/30/2015 01:44    Microbiology: No results found for this or any previous visit (from the past 240 hour(s)).   Labs: Basic Metabolic Panel:  Recent Labs Lab 11/30/15 1058 12/01/15 0934 12/02/15 0400 12/03/15 1116 12/04/15 0309  NA 135 133* 132* 131* 135  K 4.4 4.7 4.6 5.2* 4.4  CL 99* 96* 95* 95* 99*  CO2 28 27 29 28 28   GLUCOSE 153* 189* 128* 94 81  BUN 42* 55* 62* 62* 56*  CREATININE 2.88* 3.50* 3.57* 2.89* 2.37*  CALCIUM 8.9 9.0 8.8* 8.6* 8.6*   Liver Function Tests:  Recent Labs Lab 11/29/15 2335  AST 18  ALT 9*  ALKPHOS 89  BILITOT 0.3  PROT 8.0  ALBUMIN 3.6    Recent Labs Lab 11/29/15 2335  LIPASE 48   No results for input(s): AMMONIA in the last 168 hours. CBC:  Recent Labs Lab 11/29/15 2335 11/30/15 1058  WBC 9.2 4.6  HGB 11.6* 10.4*  HCT 36.2  32.9*  MCV 93.8 91.9  PLT 174 171   Cardiac Enzymes:  Recent Labs Lab 11/30/15 1058  TROPONINI 0.16*   BNP: BNP (last 3 results)  Recent Labs  11/29/15 2335  BNP 184.9*    ProBNP (last 3 results) No results for input(s): PROBNP in the last 8760 hours.  CBG: No results for input(s): GLUCAP in the last 168 hours.     Signed:  Dana Allan, MD  [facility] Pager #: [phone] 7PM-7AM contact night coverage as above

## 2015-12-04 NOTE — Progress Notes (Signed)
PT Cancellation Note  Patient Details Name: [name] MRN: [id number] DOB: Jan 19, 1923   Cancelled Treatment:    Reason Eval/Treat Not Completed: Patient declined, no reason specified (Asked PT to come back later ).  Will try later as time allows.   Ramond Dial 12/04/2015, 12:25 PM    Mee Hives, PT MS Acute Rehab Dept. Number: [place]

## 2015-12-05 ENCOUNTER — Other Ambulatory Visit: Payer: Self-pay

## 2015-12-05 DIAGNOSIS — Z9981 Dependence on supplemental oxygen: Secondary | ICD-10-CM | POA: Diagnosis not present

## 2015-12-05 DIAGNOSIS — Z7952 Long term (current) use of systemic steroids: Secondary | ICD-10-CM | POA: Diagnosis not present

## 2015-12-05 DIAGNOSIS — H409 Unspecified glaucoma: Secondary | ICD-10-CM | POA: Diagnosis not present

## 2015-12-05 DIAGNOSIS — J9611 Chronic respiratory failure with hypoxia: Secondary | ICD-10-CM | POA: Diagnosis not present

## 2015-12-05 DIAGNOSIS — K219 Gastro-esophageal reflux disease without esophagitis: Secondary | ICD-10-CM | POA: Diagnosis not present

## 2015-12-05 DIAGNOSIS — I130 Hypertensive heart and chronic kidney disease with heart failure and stage 1 through stage 4 chronic kidney disease, or unspecified chronic kidney disease: Secondary | ICD-10-CM | POA: Diagnosis not present

## 2015-12-05 DIAGNOSIS — Z7982 Long term (current) use of aspirin: Secondary | ICD-10-CM | POA: Diagnosis not present

## 2015-12-05 DIAGNOSIS — R1310 Dysphagia, unspecified: Secondary | ICD-10-CM | POA: Diagnosis not present

## 2015-12-05 DIAGNOSIS — R0602 Shortness of breath: Secondary | ICD-10-CM

## 2015-12-05 DIAGNOSIS — J441 Chronic obstructive pulmonary disease with (acute) exacerbation: Secondary | ICD-10-CM | POA: Diagnosis not present

## 2015-12-05 DIAGNOSIS — N184 Chronic kidney disease, stage 4 (severe): Secondary | ICD-10-CM | POA: Diagnosis not present

## 2015-12-05 DIAGNOSIS — I5042 Chronic combined systolic (congestive) and diastolic (congestive) heart failure: Secondary | ICD-10-CM | POA: Diagnosis not present

## 2015-12-06 ENCOUNTER — Encounter: Payer: Self-pay | Admitting: *Deleted

## 2015-12-06 ENCOUNTER — Inpatient Hospital Stay: Payer: Medicare Other | Admitting: Family Medicine

## 2015-12-06 ENCOUNTER — Other Ambulatory Visit: Payer: Self-pay | Admitting: *Deleted

## 2015-12-06 NOTE — Patient Outreach (Signed)
[facility]) Care Management [place] Telephone Outreach, Transition of Care day 1 12/06/2015  ARIZA GRIEVE 04/08/1923 [id number]  Successful telephone outreach to [name], 82 y/o female referred to [facility] after recent IP hospitalization July 7-12, 2017, for dyspnea and COPD exacerbation.  During hospitalization, patient was treated with steroids and O2/ nebulizer therapy.  Patient also noted to have chronic disease states of CKD, CHF, HTN, and tachy-brady syndrome.  Ms. Lehrer uses O2 at home at 2 L/min via [place].  Today, patient reports that she is "doing well and having no problems."  Ms. Dutt reports that she lives with her granddaughter and that her granddaughter and daughter [name] provide care and assistance to her, including her transportation to and from provider appointments.  Ms. Chasin reports that she has a follow up PCP appointment "at the end of the month," and also reports that she has home health [facility]) coming to see her, stating that "the nurse came yesterday."  Ms. Hemmingsen also reports that she has all of her medications and takes them all as prescribed, stating that her family prepares her medications for her.  Discussed [facility] CM services with patient, and patient was agreeable to continued [facility] follow up; I explained the difference between [place] and home health services.  I provided patient the phone numbers for the [facility] 24-hour nurse line, as well as the main number to [facility] CM, (and let her know that another [facility] RN CM would be contacting her next week for continued transition of care follow up).  Patient denies further questions, concerns, needs, or problems at this time.   Plan:  Ms. Engstrand will continue to take her medications as they are prescribed and will attend all scheduled provider appointments.  Ms. Shipman will continue to participate in [facility] services as they ordered post-hospital discharge.  Ms. Demetrius will  contact her providers for questions, concerns, issues, or problems that arise.  [place] telephone outreach for ongoing transition of care next week; [place] CM in-home visit to be scheduled later this month when I can speak with patient's family members.   Oneta Rack, RN, BSN, [employer] [facility] Care Management  [phone]

## 2015-12-10 DIAGNOSIS — J441 Chronic obstructive pulmonary disease with (acute) exacerbation: Secondary | ICD-10-CM | POA: Diagnosis not present

## 2015-12-10 DIAGNOSIS — N184 Chronic kidney disease, stage 4 (severe): Secondary | ICD-10-CM | POA: Diagnosis not present

## 2015-12-10 DIAGNOSIS — I5042 Chronic combined systolic (congestive) and diastolic (congestive) heart failure: Secondary | ICD-10-CM | POA: Diagnosis not present

## 2015-12-10 DIAGNOSIS — Z9981 Dependence on supplemental oxygen: Secondary | ICD-10-CM | POA: Diagnosis not present

## 2015-12-10 DIAGNOSIS — K219 Gastro-esophageal reflux disease without esophagitis: Secondary | ICD-10-CM | POA: Diagnosis not present

## 2015-12-10 DIAGNOSIS — H409 Unspecified glaucoma: Secondary | ICD-10-CM | POA: Diagnosis not present

## 2015-12-10 DIAGNOSIS — R1310 Dysphagia, unspecified: Secondary | ICD-10-CM | POA: Diagnosis not present

## 2015-12-10 DIAGNOSIS — I130 Hypertensive heart and chronic kidney disease with heart failure and stage 1 through stage 4 chronic kidney disease, or unspecified chronic kidney disease: Secondary | ICD-10-CM | POA: Diagnosis not present

## 2015-12-10 DIAGNOSIS — J9611 Chronic respiratory failure with hypoxia: Secondary | ICD-10-CM | POA: Diagnosis not present

## 2015-12-10 DIAGNOSIS — Z7982 Long term (current) use of aspirin: Secondary | ICD-10-CM | POA: Diagnosis not present

## 2015-12-10 DIAGNOSIS — Z7952 Long term (current) use of systemic steroids: Secondary | ICD-10-CM | POA: Diagnosis not present

## 2015-12-13 ENCOUNTER — Encounter: Payer: Self-pay | Admitting: *Deleted

## 2015-12-13 ENCOUNTER — Other Ambulatory Visit: Payer: Self-pay | Admitting: *Deleted

## 2015-12-13 DIAGNOSIS — I5022 Chronic systolic (congestive) heart failure: Secondary | ICD-10-CM | POA: Diagnosis not present

## 2015-12-13 DIAGNOSIS — N184 Chronic kidney disease, stage 4 (severe): Secondary | ICD-10-CM | POA: Diagnosis not present

## 2015-12-13 DIAGNOSIS — R32 Unspecified urinary incontinence: Secondary | ICD-10-CM | POA: Diagnosis not present

## 2015-12-13 DIAGNOSIS — Z9981 Dependence on supplemental oxygen: Secondary | ICD-10-CM | POA: Diagnosis not present

## 2015-12-13 DIAGNOSIS — Z7982 Long term (current) use of aspirin: Secondary | ICD-10-CM | POA: Diagnosis not present

## 2015-12-13 DIAGNOSIS — J9611 Chronic respiratory failure with hypoxia: Secondary | ICD-10-CM | POA: Diagnosis not present

## 2015-12-13 DIAGNOSIS — M6281 Muscle weakness (generalized): Secondary | ICD-10-CM | POA: Diagnosis not present

## 2015-12-13 DIAGNOSIS — I5042 Chronic combined systolic (congestive) and diastolic (congestive) heart failure: Secondary | ICD-10-CM | POA: Diagnosis not present

## 2015-12-13 DIAGNOSIS — K219 Gastro-esophageal reflux disease without esophagitis: Secondary | ICD-10-CM | POA: Diagnosis not present

## 2015-12-13 DIAGNOSIS — R1310 Dysphagia, unspecified: Secondary | ICD-10-CM | POA: Diagnosis not present

## 2015-12-13 DIAGNOSIS — J441 Chronic obstructive pulmonary disease with (acute) exacerbation: Secondary | ICD-10-CM | POA: Diagnosis not present

## 2015-12-13 DIAGNOSIS — I130 Hypertensive heart and chronic kidney disease with heart failure and stage 1 through stage 4 chronic kidney disease, or unspecified chronic kidney disease: Secondary | ICD-10-CM | POA: Diagnosis not present

## 2015-12-13 DIAGNOSIS — H409 Unspecified glaucoma: Secondary | ICD-10-CM | POA: Diagnosis not present

## 2015-12-13 DIAGNOSIS — J449 Chronic obstructive pulmonary disease, unspecified: Secondary | ICD-10-CM | POA: Diagnosis not present

## 2015-12-13 DIAGNOSIS — Z7952 Long term (current) use of systemic steroids: Secondary | ICD-10-CM | POA: Diagnosis not present

## 2015-12-13 DIAGNOSIS — M818 Other osteoporosis without current pathological fracture: Secondary | ICD-10-CM | POA: Diagnosis not present

## 2015-12-13 NOTE — Patient Outreach (Signed)
Weekly transition of care call placed to patient as this care manager is covering for assigned care manager, L. Tousey.  Member verifies identity, states she is doing "pretty good."  She denies any shortness of breath or trouble breathing.  She reports that she has continued to take all medications as prescribed and is still having visits from home health.  She also confirms that she has a personal care aide that is with her 2 hrs/day.  She states that she has a follow up appointment with her PCP on 7/31.  She denies any concerns at this time.  Will provide update to assigned case manager to provide follow up next week.  Valente David, [place], MSN [place] [phone]

## 2015-12-13 NOTE — Progress Notes (Signed)
This encounter was created in error - please disregard.

## 2015-12-17 DIAGNOSIS — R1310 Dysphagia, unspecified: Secondary | ICD-10-CM | POA: Diagnosis not present

## 2015-12-17 DIAGNOSIS — J9611 Chronic respiratory failure with hypoxia: Secondary | ICD-10-CM | POA: Diagnosis not present

## 2015-12-17 DIAGNOSIS — H409 Unspecified glaucoma: Secondary | ICD-10-CM | POA: Diagnosis not present

## 2015-12-17 DIAGNOSIS — J441 Chronic obstructive pulmonary disease with (acute) exacerbation: Secondary | ICD-10-CM | POA: Diagnosis not present

## 2015-12-17 DIAGNOSIS — Z7952 Long term (current) use of systemic steroids: Secondary | ICD-10-CM | POA: Diagnosis not present

## 2015-12-17 DIAGNOSIS — K219 Gastro-esophageal reflux disease without esophagitis: Secondary | ICD-10-CM | POA: Diagnosis not present

## 2015-12-17 DIAGNOSIS — I5042 Chronic combined systolic (congestive) and diastolic (congestive) heart failure: Secondary | ICD-10-CM | POA: Diagnosis not present

## 2015-12-17 DIAGNOSIS — Z7982 Long term (current) use of aspirin: Secondary | ICD-10-CM | POA: Diagnosis not present

## 2015-12-17 DIAGNOSIS — Z9981 Dependence on supplemental oxygen: Secondary | ICD-10-CM | POA: Diagnosis not present

## 2015-12-17 DIAGNOSIS — I130 Hypertensive heart and chronic kidney disease with heart failure and stage 1 through stage 4 chronic kidney disease, or unspecified chronic kidney disease: Secondary | ICD-10-CM | POA: Diagnosis not present

## 2015-12-17 DIAGNOSIS — N184 Chronic kidney disease, stage 4 (severe): Secondary | ICD-10-CM | POA: Diagnosis not present

## 2015-12-18 ENCOUNTER — Other Ambulatory Visit: Payer: Self-pay | Admitting: *Deleted

## 2015-12-18 NOTE — Patient Outreach (Signed)
[facility]) Care Management [facility] Community CM Telephone Outreach, Transition of Care Day 13 12/18/2015  MELANNIE WEED 12-Nov-1922 [id number]   Successful telephone outreach to Thalia Party, 82 y/o female referred to [facility] after recent IP hospitalization July 7-12, 2017, for dyspnea and COPD exacerbation.  During hospitalization, patient was treated with steroids and O2/ nebulizer therapy.  Patient also noted to have chronic disease states of CKD, CHF, HTN, and tachy-brady syndrome.  Ms. Nagasawa uses O2 at home at 2 L/min via [place].  Today, patient reports that she continues to be "doing well and having no problems."   Ms. Burk reports that she has home health [facility]) coming to visit her, and states that the visits "are going great."  Ms. Lehto again reports that she has all of her medications and takes them all as prescribed, stating that her family prepares her medications for her.  Patient was reluctant today to schedule a [place] home visit, stating that her daughter arranges all of her appointments for her.  I was able to speak to patient's daughter [name] today, and we discussed [place] services; daughter was agreeable to continued [facility] follow up; I explained the difference between [place] and home health services, and we were able to schedule a [place] in-home visit for next week.    Daughter/ caregiver [name] also confirmed that Ms. Andree is doing well and actively participating with [facility] services as they are ordered.  [name] confirmed that Ms. Hackl has a follow up visit with her PCP on Monday, December 23, 2015, and states that Ms. Caso's family will provide transportation to appointment.  [name] reported that family/ patient have been checking her BP every day prior to giving her BP medications, and states, "that has really helped her BP not bottom out."  [name] reports that Ms. Lewkowicz does not weigh herself everyday, but does so "several times a  week."  [name] was familiar with weight gain guidelines/ plan of action for weight gain, to contact MD if patient experiences weight gain > 3 lbs overnight or >5 lbs in one week. During our conversation, Ms. Langlois reported to [place] that her weight yesterday morning was 208 lbs, "right where she stays." I encouraged [name] to monitor and record patient weights daily, and she said they would try to do.  I also provided [name] the phone numbers for the [facility] 24-hour nurse line, my direct phone number, as well as the main number to [facility] CM.  Patient and her daughter [name] deny further questions, concerns, needs, or problems at this time.   Plan:  Ms. Vanblaricum will continue to take her medications as they are prescribed and will attend all scheduled provider appointments.  Ms. Mapa will continue to participate in [facility] services as they ordered post-hospital discharge.  Ms. Koffler will begin monitoring and recording her daily weights and continue to monitor and record her daily BP readings.  Ms. Grotte will contact her providers for questions, concerns, issues, or problems that arise.  [place] in-home visit for ongoing transition of care next week.  Oneta Rack, RN, BSN, [employer] [facility] Care Management  [phone]

## 2015-12-19 DIAGNOSIS — I130 Hypertensive heart and chronic kidney disease with heart failure and stage 1 through stage 4 chronic kidney disease, or unspecified chronic kidney disease: Secondary | ICD-10-CM | POA: Diagnosis not present

## 2015-12-19 DIAGNOSIS — J9611 Chronic respiratory failure with hypoxia: Secondary | ICD-10-CM | POA: Diagnosis not present

## 2015-12-19 DIAGNOSIS — K219 Gastro-esophageal reflux disease without esophagitis: Secondary | ICD-10-CM | POA: Diagnosis not present

## 2015-12-19 DIAGNOSIS — Z7952 Long term (current) use of systemic steroids: Secondary | ICD-10-CM | POA: Diagnosis not present

## 2015-12-19 DIAGNOSIS — J441 Chronic obstructive pulmonary disease with (acute) exacerbation: Secondary | ICD-10-CM | POA: Diagnosis not present

## 2015-12-19 DIAGNOSIS — H409 Unspecified glaucoma: Secondary | ICD-10-CM | POA: Diagnosis not present

## 2015-12-19 DIAGNOSIS — Z9981 Dependence on supplemental oxygen: Secondary | ICD-10-CM | POA: Diagnosis not present

## 2015-12-19 DIAGNOSIS — I5042 Chronic combined systolic (congestive) and diastolic (congestive) heart failure: Secondary | ICD-10-CM | POA: Diagnosis not present

## 2015-12-19 DIAGNOSIS — Z7982 Long term (current) use of aspirin: Secondary | ICD-10-CM | POA: Diagnosis not present

## 2015-12-19 DIAGNOSIS — N184 Chronic kidney disease, stage 4 (severe): Secondary | ICD-10-CM | POA: Diagnosis not present

## 2015-12-19 DIAGNOSIS — R1310 Dysphagia, unspecified: Secondary | ICD-10-CM | POA: Diagnosis not present

## 2015-12-23 ENCOUNTER — Ambulatory Visit (INDEPENDENT_AMBULATORY_CARE_PROVIDER_SITE_OTHER): Payer: Medicare Other | Admitting: Family Medicine

## 2015-12-23 ENCOUNTER — Encounter: Payer: Self-pay | Admitting: Family Medicine

## 2015-12-23 VITALS — BP 124/72 | HR 80 | Temp 97.6°F | Resp 20 | Ht 61.5 in | Wt 208.0 lb

## 2015-12-23 DIAGNOSIS — I5020 Unspecified systolic (congestive) heart failure: Secondary | ICD-10-CM | POA: Diagnosis not present

## 2015-12-23 DIAGNOSIS — IMO0001 Reserved for inherently not codable concepts without codable children: Secondary | ICD-10-CM

## 2015-12-23 DIAGNOSIS — J449 Chronic obstructive pulmonary disease, unspecified: Secondary | ICD-10-CM | POA: Diagnosis not present

## 2015-12-23 DIAGNOSIS — I10 Essential (primary) hypertension: Secondary | ICD-10-CM | POA: Diagnosis not present

## 2015-12-23 DIAGNOSIS — Z09 Encounter for follow-up examination after completed treatment for conditions other than malignant neoplasm: Secondary | ICD-10-CM

## 2015-12-23 LAB — CBC WITH DIFFERENTIAL/PLATELET
BASOS ABS: 0 {cells}/uL (ref 0–200)
Basophils Relative: 0 %
EOS ABS: 612 {cells}/uL — AB (ref 15–500)
Eosinophils Relative: 9 %
HCT: 33.6 % — ABNORMAL LOW (ref 35.0–45.0)
Hemoglobin: 10.7 g/dL — ABNORMAL LOW (ref 12.0–15.0)
LYMPHS PCT: 32 %
Lymphs Abs: 2176 cells/uL (ref 850–3900)
MCH: 29.7 pg (ref 27.0–33.0)
MCHC: 31.8 g/dL — AB (ref 32.0–36.0)
MCV: 93.3 fL (ref 80.0–100.0)
MONOS PCT: 10 %
MPV: 11.5 fL (ref 7.5–12.5)
Monocytes Absolute: 680 cells/uL (ref 200–950)
NEUTROS PCT: 49 %
Neutro Abs: 3332 cells/uL (ref 1500–7800)
PLATELETS: 170 10*3/uL (ref 140–400)
RBC: 3.6 MIL/uL — ABNORMAL LOW (ref 3.80–5.10)
RDW: 15 % (ref 11.0–15.0)
WBC: 6.8 10*3/uL (ref 3.8–10.8)

## 2015-12-23 LAB — BASIC METABOLIC PANEL WITH GFR
BUN: 51 mg/dL — AB (ref 7–25)
CHLORIDE: 100 mmol/L (ref 98–110)
CO2: 32 mmol/L — ABNORMAL HIGH (ref 20–31)
CREATININE: 2.52 mg/dL — AB (ref 0.60–0.88)
Calcium: 9.1 mg/dL (ref 8.6–10.4)
GFR, Est African American: 19 mL/min — ABNORMAL LOW (ref >=60)
GFR, Est Non African American: 16 mL/min — ABNORMAL LOW (ref >=60)
Glucose, Bld: 80 mg/dL (ref 70–99)
Potassium: 5.2 mmol/L (ref 3.5–5.3)
Sodium: 141 mmol/L (ref 135–146)

## 2015-12-23 NOTE — Progress Notes (Signed)
Subjective:    Patient ID: [name], female    DOB: December 24, 1922, 82 y.o.   MRN: [id number]  HPI Patient was admitted July 7 through July 12 with a COPD exacerbation. She was treated with oxygen, steroids, and nebulizer treatments. She is here today for follow-up.  She has completed her steroid treatment. She states that her breathing is back to normal. However she is still wheezing today on examination with expiratory wheezes in all 4 lung fields. There is no increased work of breathing. I do not appreciate any crackles Rales. There is no evidence of JVD. There is no pitting edema in her legs. There is no sign of fluid overload. However since her discharge from the hospital, her daughter has been giving her 25 mg twice daily of carvedilol instead of 6.125 mg at the hospital recommended. The patient has also only been using her Symbicort once a day. She is compliant using her spiriva daily. Past Medical History:  Diagnosis Date  . Arthritis   . Cardiomyopathy ([place])    a. ? ischemic vs non-ischemic;  b. 09/2013 Echo: EF 25-30%.  . Chronic systolic CHF (congestive heart failure) ([place])    a. 10/2013 Echo: EF 25-30%, diff HK, Gr 1 DD, triv AI, mild to mod MR, dev dil LA, mild RV dysfxn, mildly dil RA, mod TR.  . CKD (chronic kidney disease), stage III   . COPD (chronic obstructive pulmonary disease) ([place])   . Depression   . GERD (gastroesophageal reflux disease)   . Glaucoma of both eyes   . Gout   . Hypertension   . Kidney stones   . Obesity   . Tachy-brady syndrome ([place])    a. 03/2006 s/p SJM 5356 [place] ADx XLDR DC PPM (ser#: [id number]).   Past Surgical History:  Procedure Laterality Date  . CARDIAC CATHETERIZATION  10/2000   Archie Endo 10/10/2010  . CHOLECYSTECTOMY OPEN    . INSERT / REPLACE / REMOVE PACEMAKER  08/2000; 03/2006   Archie Endo 10/07/2010  . KIDNEY STONE SURGERY     "hospitalized for 19 days"  . TUBAL LIGATION     Current Outpatient Prescriptions on File Prior to Visit    Medication Sig Dispense Refill  . albuterol (PROVENTIL) (2.5 MG/3ML) 0.083% nebulizer solution INHALE 1 VIAL VIA NEBULIZER EVERY 6 HOURS AS NEEDED FOR WHEEZING AND SHORTNESS OF BREATH 150 mL 4  . allopurinol (ZYLOPRIM) 100 MG tablet Take 1 tablet (100 mg total) by mouth daily. 30 tablet 1  . aspirin 81 MG tablet Take 1 tablet (81 mg total) by mouth daily. 30 tablet 11  . budesonide-formoterol (SYMBICORT) 160-4.5 MCG/ACT inhaler Inhale 2 puffs into the lungs 2 (two) times daily. 1 Inhaler 12  . citalopram (CELEXA) 10 MG tablet TAKE 1 TABLET BY MOUTH DAILY 30 tablet 11  . DIGOX 125 MCG tablet TAKE ONE TABLET BY MOUTH DAILY 30 tablet 3  . ferrous sulfate 325 (65 FE) MG EC tablet Take 325 mg by mouth daily with breakfast.    . furosemide (LASIX) 20 MG tablet TAKE ONE TABLET BY MOUTH TWO TIMES DAILY. 60 tablet 4  . OXYGEN Inhale 2 L into the lungs continuous. Reported on 11/29/2015    . pantoprazole (PROTONIX) 40 MG tablet TAKE 1 TABLET BY MOUTH EVERY DAY 30 tablet 11  . predniSONE (DELTASONE) 10 MG tablet Take 4 tablets (40 mg total) by mouth daily with breakfast. Prednisone 40mg  po daily for 3 days, then 30mg  po daily for 3 days, then 20mg   po daily for 3 days and then 10mg  po daily for 3 days and stop 6 tablet 0  . tiotropium (SPIRIVA HANDIHALER) 18 MCG inhalation capsule Place 1 capsule (18 mcg total) into inhaler and inhale daily. 30 capsule 2  . carvedilol (COREG) 6.25 MG tablet TAKE 1 TABLET BY MOUTH TWICE DAILY WITH A MEAL (Patient not taking: Reported on 12/23/2015) 60 tablet 0   No current facility-administered medications on file prior to visit.    No Known Allergies Social History   Social History  . Marital status: Widowed    Spouse name: N/A  . Number of children: N/A  . Years of education: N/A   Occupational History  . Not on file.   Social History Main Topics  . Smoking status: Former Smoker    Types: Cigarettes  . Smokeless tobacco: Never Used     Comment: "never inhaled  cigarettes; just lit them"  . Alcohol use Yes     Comment: 07/23/2014 "I used to drink beer; nothing in 3-4 years"  . Drug use: No  . Sexual activity: No   Other Topics Concern  . Not on file   Social History Narrative  . No narrative on file      Review of Systems  All other systems reviewed and are negative.      Objective:   Physical Exam  Constitutional: She appears well-developed and well-nourished. No distress.  Neck: Neck supple. No JVD present.  Cardiovascular: Normal rate, regular rhythm, normal heart sounds and intact distal pulses.   No murmur heard. Pulmonary/Chest: Effort normal. No respiratory distress. She has wheezes. She has no rales. She exhibits no tenderness.  Abdominal: Soft. Bowel sounds are normal.  Musculoskeletal: She exhibits no edema.  Skin: She is not diaphoretic.  Vitals reviewed.         Assessment & Plan:  Hospital discharge follow-up - Plan: CBC with Differential/Platelet, BASIC METABOLIC PANEL WITH GFR  COPD bronchitis  Systolic congestive heart failure, unspecified congestive heart failure chronicity ([place]) - Plan: CBC with Differential/Platelet, BASIC METABOLIC PANEL WITH GFR  Essential hypertension  Patient's blood pressure today is excellent. There is no evidence of fluid overload or signs of congestive heart failure. Therefore I recommended that she decrease the carvedilol back to the recommended dose given to her in the hospital which is 6.125 mg twice daily. Her exam today is more significant for her wheezing. Hopefully decreasing the dose the carvedilol may help. Also want her doing the Symbicort twice daily. I explained to the patient that the medicine is only effective for 12 hours and that she needs to use it twice a day to receive its full benefit. Continue Spiriva. She can use albuterol 2 puffs inhaled every 6 hours as needed for wheezing and cough. At present she states that she is only using it once a day. When I ask her how  she decides when to use it, the patient states that she just does. I recommended that whenever she starts wheezing or coughing or feeling short of breath that she give herself 2 puffs of that medication.

## 2015-12-27 ENCOUNTER — Other Ambulatory Visit: Payer: Self-pay | Admitting: Family Medicine

## 2015-12-27 ENCOUNTER — Encounter: Payer: Self-pay | Admitting: *Deleted

## 2015-12-27 ENCOUNTER — Other Ambulatory Visit: Payer: Self-pay | Admitting: *Deleted

## 2015-12-27 DIAGNOSIS — M2550 Pain in unspecified joint: Secondary | ICD-10-CM | POA: Diagnosis not present

## 2015-12-27 DIAGNOSIS — I5022 Chronic systolic (congestive) heart failure: Secondary | ICD-10-CM | POA: Diagnosis not present

## 2015-12-27 MED ORDER — CARVEDILOL 6.25 MG PO TABS: ORAL_TABLET | ORAL | 0 refills | Status: DC

## 2015-12-27 MED ORDER — BUDESONIDE-FORMOTEROL FUMARATE 160-4.5 MCG/ACT IN AERO: 2.0000 | INHALATION_SPRAY | Freq: Two times a day (BID) | RESPIRATORY_TRACT | 3 refills | Status: DC

## 2015-12-27 MED ORDER — TIOTROPIUM BROMIDE MONOHYDRATE 18 MCG IN CAPS: 18.0000 ug | ORAL_CAPSULE | Freq: Every day | RESPIRATORY_TRACT | 2 refills | Status: DC

## 2015-12-27 NOTE — Patient Outreach (Signed)
[facility]) Care Management  [place] Initial Home Visit, Transition of Care day 22 12/27/2015  [name] 31-Aug-1922 [id number]  [name] is an 81 y.o. female referred to [place] for transition of care after recent IP hospitalization July 7-12, 2017, for dyspnea and COPD exacerbation.  During hospitalization, patient was treated with steroids and O2/ nebulizer therapy.  Patient also noted to have chronic disease states of CKD, CHF, HTN, and tachy-brady syndrome.  [name] uses O2 at home at 2 L/min via [place].  Today, [name] states that she is "doing pretty good."  Patient's daughter, [name] (on [facility] written consent) is present during today's initial home visit.  Home Health services are currently active through [facility].  Medications:  Patient's daughter [name] prepares [name]'s medications for her and fills a weekly pill box.  Patient and her daughter reports that patient has all of her medications and takes as prescribed.  Patient was recently discharged from hospital and all medications were thoroughly reviewed today with patient's caregiver [name].  General Safety:  Patient reports > 2 falls in the last year, which she reports she did not have injury with previous falls.  Patient/ daughter [name] deny any new falls since patient was discharged home from the hospital.  Patient has a walker that she uses "about 75% of the time."  [name] is able to ambulate in her bedroom, where she stays for most of the day without apparent difficulty.  Fall prevention education provided/ discussed with patient and her caregiver/ daughter, [name].  Advanced Directives:  Noted from review of EMR that patient does not have active Advanced Directives.  Discussed with/ provided education and Advanced Directive information packet to patient and her daughter.  [name] was very clear that she was not interested in considering Advanced Directives,  stating, "I really just don't want to think about that."   Support/ resource needs:  [name] and her daughter [name] deny need for additional community resources, stating that patient "has everything" she currently needs.  Patient has a Optician, dispensing who visits with her daily for several hours to fix her breakfast and lunch and perform assistance with ADL's.  Both patient and her daughter report strong family support, stating that pt.'s daughter is "in and out several times every week," and that patient's granddaughter "lives with her."  Patient's family provide meal preparation and transport patient to provider appointments.  Self Management of chronic disease state of COPD:  [name] wears O2 at 2-3 L/[place] "pretty much all the time."  Patient and her daughter were somewhat familiar with COPD zones, and this was reviewed and reinforced with them today.  Patient currently monitors and records her BP's daily, but does not monitor or record her weights regularly.  We discussed advantages and value of knowing patient's daily weights.  Patient and her daughter were provided with COPD zones magnet, as well as Scientist, clinical (histocompatibility and immunogenetics) for COPD self- management.  [name] weight today was 210 lbs. [name] and her daughter both reports that they would like additional information on self-management of COPD, and agreed to begin monitoring and recording patient's daily weights, as well as to review COPD zone information provided to them today.  Patient and her daughter report that patient visited with her PCP on Monday December 23, 2015.  [name] reports that Dr. Dennard Schaumann changed the dosing on some of her medications, and she provided an accurate report  of these changes upon review of PCP visit notes.  [name] also reports and accurate report of upcoming provider appointments for patient, and voices plans to make sure patient attends these upcoming appointments.   Subjective: "I think I am doing good for  someone in their 90's!"  Objective:  BP 112/62   Pulse 78   Resp 18   Wt 210 lb (95.3 kg)   SpO2 98%   BMI 39.04 kg/m      Review of Systems  Constitutional: Negative.  Negative for fever, malaise/fatigue and weight loss.  Respiratory: Positive for shortness of breath. Negative for cough and wheezing.   Cardiovascular: Negative.  Negative for chest pain and leg swelling.  Gastrointestinal: Negative for abdominal pain and nausea.  Genitourinary: Negative.   Musculoskeletal: Positive for falls.       History of falls reported by patient and daughter; no recent falls  Neurological: Negative.  Negative for weakness.  Psychiatric/Behavioral: Negative for depression. The patient is not nervous/anxious.     Physical Exam  Constitutional: She is oriented to person, place, and time. She appears well-developed and well-nourished.  Cardiovascular: Normal rate, regular rhythm, normal heart sounds and intact distal pulses.   Respiratory: Effort normal and breath sounds normal. No respiratory distress. She has no wheezes.  Patient on home O2 at 2-3 L/min  GI: Soft. Bowel sounds are normal.  Musculoskeletal: She exhibits no edema.  Neurological: She is alert and oriented to person, place, and time.  Skin: Skin is warm and dry. No erythema.  Psychiatric: She has a normal mood and affect. Her behavior is normal. Judgment and thought content normal.    Encounter Medications:   Outpatient Encounter Prescriptions as of 12/27/2015  Medication Sig Note  . albuterol (PROVENTIL) (2.5 MG/3ML) 0.083% nebulizer solution INHALE 1 VIAL VIA NEBULIZER EVERY 6 HOURS AS NEEDED FOR WHEEZING AND SHORTNESS OF BREATH   . allopurinol (ZYLOPRIM) 100 MG tablet Take 1 tablet (100 mg total) by mouth daily.   Marland Kitchen amLODipine (NORVASC) 5 MG tablet Take 5 mg by mouth daily.  12/23/2015: Received from: External Pharmacy  . aspirin 81 MG tablet Take 1 tablet (81 mg total) by mouth daily.   . budesonide-formoterol (SYMBICORT)  160-4.5 MCG/ACT inhaler Inhale 2 puffs into the lungs 2 (two) times daily.   . budesonide-formoterol (SYMBICORT) 160-4.5 MCG/ACT inhaler Inhale 2 puffs into the lungs 2 (two) times daily.   . carvedilol (COREG) 6.25 MG tablet TAKE 1 TABLET BY MOUTH TWICE DAILY WITH A MEAL   . citalopram (CELEXA) 10 MG tablet TAKE 1 TABLET BY MOUTH DAILY   . ferrous sulfate 325 (65 FE) MG EC tablet Take 325 mg by mouth daily with breakfast.   . furosemide (LASIX) 20 MG tablet TAKE ONE TABLET BY MOUTH TWO TIMES DAILY.   Marland Kitchen OXYGEN Inhale 2 L into the lungs continuous. Reported on 11/29/2015   . pantoprazole (PROTONIX) 40 MG tablet TAKE 1 TABLET BY MOUTH EVERY DAY   . tiotropium (SPIRIVA HANDIHALER) 18 MCG inhalation capsule Place 1 capsule (18 mcg total) into inhaler and inhale daily.   Marland Kitchen Bath 125 MCG tablet TAKE ONE TABLET BY MOUTH DAILY (Patient not taking: Reported on 12/27/2015) 03/11/2015: Pt and grand daughter unsure if she takes this medication   No facility-administered encounter medications on file as of 12/27/2015.     Functional Status:   In your present state of health, do you have any difficulty performing the following activities: 12/01/2015 11/30/2015  Hearing? - N  Vision? - N  Difficulty concentrating or making decisions? - N  Walking or climbing stairs? - Y  Dressing or bathing? - Y  Doing errands, shopping? [place] Don[place]  Some recent data might be hidden    Fall/Depression Screening:    PHQ 2/9 Scores 12/27/2015 07/26/2013  PHQ - 2 Score 0 0    Assessment:  Ms. Arms appears to be feeling better after her recent hospitalization, and denies community resource needs today.  Ms. Collins [place] has a very supportive family that actively participates in her care on a daily basis.  Ms. Burdick and her daughter [name] would like additional information on self-health management of chronic disease state of COPD.  Plan:   Ms. Nagai will continue to take her medications as they are prescribed and will attend all scheduled  provider appointments.   Ms. Adams will continue to participate in [facility] services as they ordered post-hospital discharge.   Ms. Lawrenson will begin monitoring and recording her daily weights and continue to monitor and record her daily BP readings.   Ms. Kolakowski will contact her providers for questions, concerns, issues, or problems that arise.   [place] in-home visit for ongoing transition of care next week.  I appreciate the opportunity to participate in Ms. Mota's care,  Oneta Rack, RN, BSN, [place] Coordinator [facility] Care Management  [phone]

## 2015-12-31 ENCOUNTER — Other Ambulatory Visit: Payer: Self-pay | Admitting: *Deleted

## 2015-12-31 NOTE — Patient Outreach (Signed)
[facility]) Care Management [place] Telephone Outreach, Transition of Care day 26 12/31/2015  [name] 1922/12/02 [id number]  Successful telephone outreach to [name], 82 y.o. female referred to [place] for transition of care after recent IP hospitalization July 7-12, 2017, for dyspnea and COPD exacerbation. During hospitalization, patient was treated with steroids and O2/ nebulizer therapy. Patient also noted to have chronic disease states of CKD, CHF, HTN, and tachy-brady syndrome. [name] uses O2 at home at 2 L/min via [place].   Today, patient states that she "isn't feeling well," stating that she feels "cold" and "just not like myself."  Patient was unable to apply additional definitive symptoms to how she was feeling.  Patient stated that she "thinks she is okay," but "is just having a bad day."  Because patient was unable to provide more information during our call today, I called her daughter, [name] (patient's daughter/caregiver, on [facility] consent form) to report my conversation with the patient and obtain more information if possible.  [name] reported that she was on her way to check on patient, and stated that as far as she knew "everything was okay" with the patient.  [name] reported that patient's personal care assistant was still involved in patients care on a regular basis.  I asked [name] to call me back if she had any concerns or questions about the patient's current state after she arrived at [name]'s residence, and she agreed to do so.  I also confirmed next [place] in-home visit with [name] during our telephone call today.  Plan:  [name] continue to take her medications as they are prescribed and will attend all scheduled provider appointments.  [name] will continue to participate in [facility] services as they ordered post-hospital discharge.  [name] will begin monitoring and recording her daily weights and  continue to monitor and record her daily BP readings.  [name] contact her providers for questions, concerns, issues, or problems that arise.  [place] in-home visit scheduled for later this month.   Cassandra Rack, RN, BSN, [employer] [facility] Care Management  [phone]

## 2016-01-01 ENCOUNTER — Encounter: Payer: Self-pay | Admitting: *Deleted

## 2016-01-01 ENCOUNTER — Other Ambulatory Visit: Payer: Self-pay | Admitting: *Deleted

## 2016-01-01 NOTE — Patient Outreach (Signed)
[facility]) Care Management [place] Telephone Outreach, Transition of Care, day 27 01/01/2016  [name] Oct 10, 1922 [id number]  Successful telephone outreach to [name], 82 y.o.femalereferred to [place] for transition of care after recent IP hospitalization July 7-12, 2017, for dyspnea and COPD exacerbation. During hospitalization, patient was treated with steroids and O2/ nebulizer therapy. Patient also noted to have chronic disease states of CKD, CHF, HTN, and tachy-brady syndrome. [name] uses O2 at home at 2 L/min via [place].   I received a voice mail message from patient's caregiver/ daughter [name] today at 3:00 pm, asking that I call patient to check on her.  I called [name] at 4:00 pm, HIPAA verified, and she again reported that she "just isn't feeling right."  Patient was able today to tell me that she felt "weak and sick on my stomach."  Patient reports that she is able to eat and drink, but stated several times that she "feels like her food is going to come back up."  Patient reports no difficulty breathing today and denies pain, and does not sound as if she is in any distress.  I encouraged patient to make her granddaughter (whom she lives with, but is currently not at home for me to speak with) aware of her symptoms, and let the patient know that I would call [name] back to report her symptoms.  I also encouraged the patient to consider calling her PCP for an urgent office visit if she continued to feel bad.  Patient reported today that her personal care assistant had visited her this morning, but patient stated that she could not remember what her BP was today when it was taken by her personal care assistant this morning.  I then placed a call to patient's caregiver/ daughter [name] and successfully connected with her at 4: 20 pm (after 2 unsuccessful attempts) to report my conversation with the patient today.  [name] reported  that she was on her way to check on patient now, and stated that from phone conversations she has had with patient, she believes patient is "just overly anxious."  I encouraged [name] to call me back if she had any questions, and also encouraged her to place a call to patient's PCP for an appointment if patient's symptoms did not improve.  We also discussed reasons that [name] would contact EMS or seek urgent/ emergent care, and [name] was very familiar with reasons to do so, and agreed to do so if necessary.  I also again confirmed next [place] in-home visit with [name] during our telephone call today.  Plan:  [name] continue to take her medications as they are prescribed and will attend all scheduled provider appointments.  [name] will continue to participate in [facility] services as they ordered post-Barrett discharge.  [name] will begin monitoring and recording her daily weights and continue to monitor and record her daily BP readings.  [name] contact her providers for questions, concerns, issues, or problems that arise.  [place] in-home visit scheduled for later this month.  Cassandra Rack, RN, BSN, [employer] [facility] Care Management  [phone]

## 2016-01-13 ENCOUNTER — Ambulatory Visit: Payer: Medicare Other | Admitting: *Deleted

## 2016-01-14 ENCOUNTER — Ambulatory Visit: Payer: Medicare Other | Admitting: *Deleted

## 2016-01-14 ENCOUNTER — Encounter: Payer: Self-pay | Admitting: *Deleted

## 2016-01-14 ENCOUNTER — Other Ambulatory Visit: Payer: Self-pay | Admitting: *Deleted

## 2016-01-14 NOTE — Patient Outreach (Signed)
[facility]) Care Management  [place] visit for [place] discharge 01/14/2016  YUKIKO MINNICH 1922-12-13 [id number]   KATRIONA SCHMIERER is an 82 y.o. female  referred to [facility] for transition of care after recent IP hospitalization July 7-12, 2017, for dyspnea and COPD exacerbation.  During hospitalization, patient was treated with steroids and O2/ nebulizer therapy.  Patient also noted to have chronic disease states of CKD, CHF, HTN, and tachy-brady syndrome.  Ms. Redondo uses O2 at home at 2 L/min via [place].  Today, Ms. Schleifer continues to report that she is "doing pretty good."  Patient's daughter, [name] (on [facility] written consent) is present during today's initial home visit.  [name] confirmed that [place] services through [facility] have ended, but that patient continues to have a personal care assistant who comes in several times a week to check on Ms. Toney Rakes.  [name] reports today that patient has all of her medications and is taking them as they are prescribed; [name] continues to prepare and manage Ms. Blauvelt's medications.    Self Management of chronic disease state of COPD:  During last [facility] CM in-home visit, COPD zones and corresponding action planning was reviewed with [name], and today, she was able to verbalize knowledge of COPD zones/ action plan accurately.  Additional EMMI educational materials were provided and reviewed thoroughly with [name] today.  [name] reports that patient's personal care assistant continues to monitor and record Ms. Stotler's weights and BP's.    Ms. Febo and [name] both denied further questions, needs, concerns, problems, or issues today, and we discussed that patient has successfully met her previously established [place] CM goals.  [name] agrees that patient is ready for discharge from [place] program, and I confirmed that she had all the [facility] CM phone numbers, including my direct phone  number, should they wish to contact [facility] CM in the future.  Subjective: "Nothing has changed and I'm still doing pretty good for a 82 year old."  Objective:  BP 118/68   Pulse 78   Resp 16   Wt 205 lb (93 kg)   SpO2 92%   BMI 38.11 kg/m     Review of Systems  Constitutional: Negative.  Negative for fever and malaise/fatigue.  Respiratory: Negative.  Negative for cough, shortness of breath and wheezing.   Cardiovascular: Negative.  Negative for chest pain and leg swelling.  Gastrointestinal: Negative.  Negative for abdominal pain, nausea and vomiting.  Genitourinary: Negative.   Musculoskeletal: Negative.  Negative for falls.  Neurological: Negative.  Negative for weakness.  Psychiatric/Behavioral: Negative.  Negative for depression. The patient is not nervous/anxious.     Physical Exam  Constitutional: She is oriented to person, place, and time. She appears well-developed and well-nourished. No distress.  Cardiovascular: Normal rate, regular rhythm, normal heart sounds and intact distal pulses.   Pulses:      Radial pulses are 1+ on the right side, and 2+ on the left side.  Respiratory: Effort normal and breath sounds normal. No respiratory distress. She has no wheezes. She has no rales.  Bilateral breath sounds diminished throughout A/L upper and lower lung fields; patient is on home O2 at 2 L/min via [place]  GI: Soft. Bowel sounds are normal.  Musculoskeletal: She exhibits no edema.  Neurological: She is alert and oriented to person, place, and time.  Skin: Skin is warm and dry.  Psychiatric: She has a normal mood  and affect. Her behavior is normal.    Encounter Medications:   Outpatient Encounter Prescriptions as of 01/14/2016  Medication Sig Note  . albuterol (PROVENTIL) (2.5 MG/3ML) 0.083% nebulizer solution INHALE 1 VIAL VIA NEBULIZER EVERY 6 HOURS AS NEEDED FOR WHEEZING AND SHORTNESS OF BREATH   . allopurinol (ZYLOPRIM) 100 MG tablet Take 1 tablet (100 mg total) by mouth  daily.   Marland Kitchen amLODipine (NORVASC) 5 MG tablet Take 5 mg by mouth daily.  12/23/2015: Received from: External Pharmacy  . aspirin 81 MG tablet Take 1 tablet (81 mg total) by mouth daily.   . budesonide-formoterol (SYMBICORT) 160-4.5 MCG/ACT inhaler Inhale 2 puffs into the lungs 2 (two) times daily.   . budesonide-formoterol (SYMBICORT) 160-4.5 MCG/ACT inhaler Inhale 2 puffs into the lungs 2 (two) times daily.   . carvedilol (COREG) 6.25 MG tablet TAKE 1 TABLET BY MOUTH TWICE DAILY WITH A MEAL   . citalopram (CELEXA) 10 MG tablet TAKE 1 TABLET BY MOUTH DAILY   . [place] 125 MCG tablet TAKE ONE TABLET BY MOUTH DAILY (Patient not taking: Reported on 12/27/2015) 03/11/2015: Pt and grand daughter unsure if she takes this medication  . ferrous sulfate 325 (65 FE) MG EC tablet Take 325 mg by mouth daily with breakfast.   . furosemide (LASIX) 20 MG tablet TAKE ONE TABLET BY MOUTH TWO TIMES DAILY.   Marland Kitchen OXYGEN Inhale 2 L into the lungs continuous. Reported on 11/29/2015   . pantoprazole (PROTONIX) 40 MG tablet TAKE 1 TABLET BY MOUTH EVERY DAY   . tiotropium (SPIRIVA HANDIHALER) 18 MCG inhalation capsule Place 1 capsule (18 mcg total) into inhaler and inhale daily.    No facility-administered encounter medications on file as of 01/14/2016.     Functional Status:   In your present state of health, do you have any difficulty performing the following activities: 12/27/2015 12/01/2015  Hearing? N -  Vision? N -  Difficulty concentrating or making decisions? Y -  Walking or climbing stairs? Y -  Dressing or bathing? N -  Doing errands, shopping? Tempie Donning  Preparing Food and eating ? Y -  Using the Toilet? N -  In the past six months, have you accidently leaked urine? N -  Do you have problems with loss of bowel control? N -  Managing your Medications? Y -  Managing your Finances? Y -  Housekeeping or managing your Housekeeping? Y -  Some recent data might be hidden    Fall/Depression Screening:    PHQ 2/9 Scores  12/27/2015 07/26/2013  PHQ - 2 Score 0 0    Assessment:  Ms. Rockhold continues to be doing well after her recent hospitalization, and she and her daughter/ caregiver, [name] deny further [facility] CM care coordination needs.  Ms. [place] daughter [name] is able to verbalize COPD zones and corresponding action planning around zones.  Ms. Shimp has successfully met her previously established [facility] CM goals and is ready for discharge from [place] program.  Plan:   Will discharge Ms. Natividad from [place], as she has successfully met her established goals, and will notify patient's PCP of same.  It has been a pleasure caring for Ms. Eliberto Ivory Jamse Arn, RN, BSN, [employer] [facility] Care Management  [phone]

## 2016-01-15 DIAGNOSIS — M6281 Muscle weakness (generalized): Secondary | ICD-10-CM | POA: Diagnosis not present

## 2016-01-15 DIAGNOSIS — I5022 Chronic systolic (congestive) heart failure: Secondary | ICD-10-CM | POA: Diagnosis not present

## 2016-01-15 DIAGNOSIS — J449 Chronic obstructive pulmonary disease, unspecified: Secondary | ICD-10-CM | POA: Diagnosis not present

## 2016-01-15 DIAGNOSIS — M818 Other osteoporosis without current pathological fracture: Secondary | ICD-10-CM | POA: Diagnosis not present

## 2016-01-23 ENCOUNTER — Telehealth: Payer: Self-pay | Admitting: Family Medicine

## 2016-01-23 MED ORDER — CARVEDILOL 6.25 MG PO TABS: ORAL_TABLET | ORAL | 3 refills | Status: DC

## 2016-01-23 NOTE — Telephone Encounter (Signed)
Prescription sent to pharmacy.

## 2016-01-23 NOTE — Telephone Encounter (Signed)
[name] from [facility] called saying [name] is completely out of Carvedilol and they need a refill sent to their office. Please give [name] a call regarding this if needed.  [name]'s contact number: [phone] Thank you.

## 2016-01-27 DIAGNOSIS — M2550 Pain in unspecified joint: Secondary | ICD-10-CM | POA: Diagnosis not present

## 2016-01-27 DIAGNOSIS — I5022 Chronic systolic (congestive) heart failure: Secondary | ICD-10-CM | POA: Diagnosis not present

## 2016-02-20 NOTE — Progress Notes (Signed)
This encounter was created in error - please disregard.

## 2016-02-24 ENCOUNTER — Other Ambulatory Visit: Payer: Self-pay | Admitting: Family Medicine

## 2016-02-25 DIAGNOSIS — M6281 Muscle weakness (generalized): Secondary | ICD-10-CM | POA: Diagnosis not present

## 2016-02-25 DIAGNOSIS — I5022 Chronic systolic (congestive) heart failure: Secondary | ICD-10-CM | POA: Diagnosis not present

## 2016-02-25 DIAGNOSIS — J449 Chronic obstructive pulmonary disease, unspecified: Secondary | ICD-10-CM | POA: Diagnosis not present

## 2016-02-25 DIAGNOSIS — M818 Other osteoporosis without current pathological fracture: Secondary | ICD-10-CM | POA: Diagnosis not present

## 2016-02-26 DIAGNOSIS — M2550 Pain in unspecified joint: Secondary | ICD-10-CM | POA: Diagnosis not present

## 2016-02-26 DIAGNOSIS — I5022 Chronic systolic (congestive) heart failure: Secondary | ICD-10-CM | POA: Diagnosis not present

## 2016-03-10 ENCOUNTER — Ambulatory Visit (INDEPENDENT_AMBULATORY_CARE_PROVIDER_SITE_OTHER): Payer: Medicare Other | Admitting: Internal Medicine

## 2016-03-10 ENCOUNTER — Encounter: Payer: Medicare Other | Admitting: Internal Medicine

## 2016-03-10 ENCOUNTER — Encounter: Payer: Self-pay | Admitting: Internal Medicine

## 2016-03-10 VITALS — HR 82 | Ht 61.5 in | Wt 200.0 lb

## 2016-03-10 DIAGNOSIS — I5022 Chronic systolic (congestive) heart failure: Secondary | ICD-10-CM

## 2016-03-10 DIAGNOSIS — Z950 Presence of cardiac pacemaker: Secondary | ICD-10-CM

## 2016-03-10 DIAGNOSIS — I495 Sick sinus syndrome: Secondary | ICD-10-CM | POA: Diagnosis not present

## 2016-03-10 DIAGNOSIS — I429 Cardiomyopathy, unspecified: Secondary | ICD-10-CM

## 2016-03-10 LAB — BASIC METABOLIC PANEL
BUN: 55 mg/dL — ABNORMAL HIGH (ref 7–25)
CALCIUM: 8.8 mg/dL (ref 8.6–10.4)
CO2: 30 mmol/L (ref 20–31)
Chloride: 97 mmol/L — ABNORMAL LOW (ref 98–110)
Creat: 2.1 mg/dL — ABNORMAL HIGH (ref 0.60–0.88)
GLUCOSE: 98 mg/dL (ref 65–99)
Potassium: 4.1 mmol/L (ref 3.5–5.3)
SODIUM: 139 mmol/L (ref 135–146)

## 2016-03-10 NOTE — Patient Instructions (Signed)
Medication Instructions: - Your physician recommends that you continue on your current medications as directed. Please refer to the Current Medication list given to you today.  Labwork: - Your physician recommends that you have lab work today: BMP/ digoxin  Procedures/Testing: - none ordered  Follow-Up: - Your physician wants you to follow-up in: 1 year with Dr. Caryl Comes. You will receive a reminder letter in the mail two months in advance. If you don't receive a letter, please call our office to schedule the follow-up appointment.  Any Additional Special Instructions Will Be Listed Below (If Applicable).     If you need a refill on your cardiac medications before your next appointment, please call your pharmacy.

## 2016-03-10 NOTE — Progress Notes (Signed)
Patient Care Team: [name], MD as PCP - General (Family Medicine)   HPI  [name] is a 82 y.o. female Seen in follow-up for pacemaker implanted 2002 for tachybradycardia syndrome. She underwent generator change 2007 with an RA lead revision.  She is oxygen-dependent COPD the setting of obesity hypoventilation  Recently hospitalized with COPD and CHF  She was advised to follow a 2 L fluid restriction and a low salt diet. This creats conflict in the house because she likes to drink Sprite  No edema or SOB   Ambulates with a walker   Echo EF 3/16 45-50%  Records and Results Reviewed   Past Medical History:  Diagnosis Date  . Arthritis   . Cardiomyopathy ([place])    a. ? ischemic vs non-ischemic;  b. 09/2013 Echo: EF 25-30%.  . Chronic systolic CHF (congestive heart failure) ([place])    a. 10/2013 Echo: EF 25-30%, diff HK, Gr 1 DD, triv AI, mild to mod MR, dev dil LA, mild RV dysfxn, mildly dil RA, mod TR.  . CKD (chronic kidney disease), stage III   . COPD (chronic obstructive pulmonary disease) ([place])   . Depression   . GERD (gastroesophageal reflux disease)   . Glaucoma of both eyes   . Gout   . Hypertension   . Kidney stones   . Obesity   . Tachy-brady syndrome ([place])    a. 03/2006 s/p SJM 5356 Verity ADx XLDR DC PPM (ser#: [id number]).    Past Surgical History:  Procedure Laterality Date  . CARDIAC CATHETERIZATION  10/2000   Archie Endo 10/10/2010  . CHOLECYSTECTOMY OPEN    . INSERT / REPLACE / REMOVE PACEMAKER  08/2000; 03/2006   Archie Endo 10/07/2010  . KIDNEY STONE SURGERY     "hospitalized for 19 days"  . TUBAL LIGATION      Current Outpatient Prescriptions  Medication Sig Dispense Refill  . albuterol (PROVENTIL) (2.5 MG/3ML) 0.083% nebulizer solution INHALE 1 VIAL VIA NEBULIZER EVERY 6 HOURS AS NEEDED FOR WHEEZING AND SHORTNESS OF BREATH 150 mL 4  . allopurinol (ZYLOPRIM) 100 MG tablet Take 1 tablet (100 mg total) by mouth daily. 30 tablet 1  . amLODipine  (NORVASC) 5 MG tablet Take 5 mg by mouth daily.     Marland Kitchen aspirin 81 MG tablet Take 1 tablet (81 mg total) by mouth daily. 30 tablet 11  . budesonide-formoterol (SYMBICORT) 160-4.5 MCG/ACT inhaler Inhale 2 puffs into the lungs 2 (two) times daily. 1 Inhaler 3  . carvedilol (COREG) 6.25 MG tablet TAKE 1 TABLET BY MOUTH TWICE DAILY WITH A MEAL 60 tablet 3  . citalopram (CELEXA) 10 MG tablet TAKE 1 TABLET BY MOUTH DAILY 30 tablet 11  . DIGOX 125 MCG tablet TAKE ONE TABLET BY MOUTH DAILY 30 tablet 3  . ferrous sulfate 325 (65 FE) MG EC tablet Take 325 mg by mouth daily with breakfast.    . furosemide (LASIX) 20 MG tablet TAKE ONE TABLET BY MOUTH TWO TIMES DAILY. 60 tablet 3  . OXYGEN Inhale 2 L into the lungs continuous. Reported on 11/29/2015    . pantoprazole (PROTONIX) 40 MG tablet TAKE 1 TABLET BY MOUTH EVERY DAY 30 tablet 11  . SPIRIVA HANDIHALER 18 MCG inhalation capsule INHALE THE CONTENTS OF 1 CAPSULE VIA HANDIHALER BY MOUTH EVERY DAY 30 capsule 5   No current facility-administered medications for this visit.     No Known Allergies    Review of Systems negative except from  HPI and PMH  Physical Exam Pulse 82   Ht 5' 1.5" (1.562 m)   Wt 200 lb (90.7 kg)   SpO2 93%   BMI 37.18 kg/m  Well developed and well nourished in no acute distress HENT normal E scleral and icterus clear Neck Supple JVP flat; carotids brisk and full Clear to ausculation  *Regular rate and rhythm, 2/6 M Soft with active bowel sounds No clubbing cyanosis  Edema Alert and oriented, grossly normal motor and sensory function sitting w Skin Warm and Dry  ECG sinus 82  22/12/44 LAD Anterior MI IMI  Assessment and  Plan  Pacemaker-[employer] The patient's device was interrogated.  The information was reviewed. No changes were made in the programming.    HFrEF/HFpEF  Cardiomyopathy  Hypertension  BP reasonalby controlled  Euvolemic continue current meds  Suggested that the family compromise with  ms Toney Rakes re sprite and foods    Current medicines are reviewed at length with the patient today .  The patient does not have concerns regarding medicines.

## 2016-03-11 LAB — DIGOXIN LEVEL

## 2016-03-12 LAB — CUP PACEART INCLINIC DEVICE CHECK
Date Time Interrogation Session: 20171017190809
Implantable Lead Location: 753859
Lead Channel Setting Pacing Amplitude: 2 V
Lead Channel Setting Pacing Pulse Width: 0.4 ms
Lead Channel Setting Sensing Sensitivity: 1.5 mV
MDC IDC LEAD IMPLANT DT: 19931022
MDC IDC LEAD IMPLANT DT: 20071115
MDC IDC LEAD LOCATION: 753860
MDC IDC MSMT BATTERY IMPEDANCE: 2900 Ohm
MDC IDC MSMT BATTERY VOLTAGE: 2.74 V
MDC IDC MSMT LEADCHNL RA IMPEDANCE VALUE: 235 Ohm
MDC IDC MSMT LEADCHNL RV IMPEDANCE VALUE: 374 Ohm
Pulse Gen Model: 5356
Pulse Gen Serial Number: 1124188

## 2016-03-24 ENCOUNTER — Telehealth: Payer: Self-pay

## 2016-03-24 NOTE — Telephone Encounter (Signed)
Pt is aware and agreeable with results.

## 2016-03-27 ENCOUNTER — Other Ambulatory Visit: Payer: Self-pay | Admitting: Family Medicine

## 2016-03-28 DIAGNOSIS — I5022 Chronic systolic (congestive) heart failure: Secondary | ICD-10-CM | POA: Diagnosis not present

## 2016-03-28 DIAGNOSIS — M2550 Pain in unspecified joint: Secondary | ICD-10-CM | POA: Diagnosis not present

## 2016-04-07 DIAGNOSIS — I5022 Chronic systolic (congestive) heart failure: Secondary | ICD-10-CM | POA: Diagnosis not present

## 2016-04-07 DIAGNOSIS — J449 Chronic obstructive pulmonary disease, unspecified: Secondary | ICD-10-CM | POA: Diagnosis not present

## 2016-04-07 DIAGNOSIS — M818 Other osteoporosis without current pathological fracture: Secondary | ICD-10-CM | POA: Diagnosis not present

## 2016-04-07 DIAGNOSIS — M6281 Muscle weakness (generalized): Secondary | ICD-10-CM | POA: Diagnosis not present

## 2016-04-27 DIAGNOSIS — M2550 Pain in unspecified joint: Secondary | ICD-10-CM | POA: Diagnosis not present

## 2016-04-27 DIAGNOSIS — I5022 Chronic systolic (congestive) heart failure: Secondary | ICD-10-CM | POA: Diagnosis not present

## 2016-04-29 ENCOUNTER — Other Ambulatory Visit: Payer: Self-pay | Admitting: Family Medicine

## 2016-05-22 DIAGNOSIS — M6281 Muscle weakness (generalized): Secondary | ICD-10-CM | POA: Diagnosis not present

## 2016-05-22 DIAGNOSIS — M818 Other osteoporosis without current pathological fracture: Secondary | ICD-10-CM | POA: Diagnosis not present

## 2016-05-22 DIAGNOSIS — J449 Chronic obstructive pulmonary disease, unspecified: Secondary | ICD-10-CM | POA: Diagnosis not present

## 2016-05-22 DIAGNOSIS — I5022 Chronic systolic (congestive) heart failure: Secondary | ICD-10-CM | POA: Diagnosis not present

## 2016-05-28 ENCOUNTER — Other Ambulatory Visit: Payer: Self-pay | Admitting: Family Medicine

## 2016-05-28 ENCOUNTER — Telehealth: Payer: Self-pay | Admitting: *Deleted

## 2016-05-28 DIAGNOSIS — M818 Other osteoporosis without current pathological fracture: Secondary | ICD-10-CM | POA: Diagnosis not present

## 2016-05-28 DIAGNOSIS — M6281 Muscle weakness (generalized): Secondary | ICD-10-CM | POA: Diagnosis not present

## 2016-05-28 DIAGNOSIS — M2550 Pain in unspecified joint: Secondary | ICD-10-CM | POA: Diagnosis not present

## 2016-05-28 DIAGNOSIS — J449 Chronic obstructive pulmonary disease, unspecified: Secondary | ICD-10-CM | POA: Diagnosis not present

## 2016-05-28 DIAGNOSIS — I5022 Chronic systolic (congestive) heart failure: Secondary | ICD-10-CM | POA: Diagnosis not present

## 2016-05-28 NOTE — Telephone Encounter (Signed)
Prescription faxed to [facility].   Call placed to patient and patient made aware.

## 2016-05-28 NOTE — Telephone Encounter (Signed)
Received call from patient.   Reports that she has misplaced nebulizer machine in process of moving.   Requested order for new machine.   Ok to order?

## 2016-05-28 NOTE — Telephone Encounter (Signed)
ok 

## 2016-06-22 ENCOUNTER — Other Ambulatory Visit: Payer: Self-pay | Admitting: Family Medicine

## 2016-06-23 DIAGNOSIS — M818 Other osteoporosis without current pathological fracture: Secondary | ICD-10-CM | POA: Diagnosis not present

## 2016-06-23 DIAGNOSIS — J449 Chronic obstructive pulmonary disease, unspecified: Secondary | ICD-10-CM | POA: Diagnosis not present

## 2016-06-23 DIAGNOSIS — I5022 Chronic systolic (congestive) heart failure: Secondary | ICD-10-CM | POA: Diagnosis not present

## 2016-06-23 DIAGNOSIS — M6281 Muscle weakness (generalized): Secondary | ICD-10-CM | POA: Diagnosis not present

## 2016-06-28 DIAGNOSIS — M6281 Muscle weakness (generalized): Secondary | ICD-10-CM | POA: Diagnosis not present

## 2016-06-28 DIAGNOSIS — I5022 Chronic systolic (congestive) heart failure: Secondary | ICD-10-CM | POA: Diagnosis not present

## 2016-06-28 DIAGNOSIS — M2550 Pain in unspecified joint: Secondary | ICD-10-CM | POA: Diagnosis not present

## 2016-06-28 DIAGNOSIS — J449 Chronic obstructive pulmonary disease, unspecified: Secondary | ICD-10-CM | POA: Diagnosis not present

## 2016-07-21 ENCOUNTER — Encounter: Payer: Self-pay | Admitting: Family Medicine

## 2016-07-21 ENCOUNTER — Other Ambulatory Visit: Payer: Self-pay | Admitting: Family Medicine

## 2016-07-21 ENCOUNTER — Ambulatory Visit (INDEPENDENT_AMBULATORY_CARE_PROVIDER_SITE_OTHER): Payer: Medicare Other | Admitting: Family Medicine

## 2016-07-21 VITALS — BP 136/62 | HR 78 | Temp 97.9°F | Resp 20 | Ht 61.5 in | Wt 192.0 lb

## 2016-07-21 DIAGNOSIS — R634 Abnormal weight loss: Secondary | ICD-10-CM

## 2016-07-21 DIAGNOSIS — I5020 Unspecified systolic (congestive) heart failure: Secondary | ICD-10-CM | POA: Diagnosis not present

## 2016-07-21 DIAGNOSIS — Z23 Encounter for immunization: Secondary | ICD-10-CM | POA: Diagnosis not present

## 2016-07-21 DIAGNOSIS — I495 Sick sinus syndrome: Secondary | ICD-10-CM

## 2016-07-21 DIAGNOSIS — I10 Essential (primary) hypertension: Secondary | ICD-10-CM | POA: Diagnosis not present

## 2016-07-21 DIAGNOSIS — N184 Chronic kidney disease, stage 4 (severe): Secondary | ICD-10-CM

## 2016-07-21 DIAGNOSIS — D631 Anemia in chronic kidney disease: Secondary | ICD-10-CM

## 2016-07-21 LAB — CBC WITH DIFFERENTIAL/PLATELET
BASOS ABS: 0 {cells}/uL (ref 0–200)
Basophils Relative: 0 %
EOS ABS: 320 {cells}/uL (ref 15–500)
EOS PCT: 4 %
HCT: 31.6 % — ABNORMAL LOW (ref 35.0–45.0)
HEMOGLOBIN: 10 g/dL — AB (ref 12.0–15.0)
LYMPHS ABS: 2320 {cells}/uL (ref 850–3900)
Lymphocytes Relative: 29 %
MCH: 29.4 pg (ref 27.0–33.0)
MCHC: 31.6 g/dL — AB (ref 32.0–36.0)
MCV: 92.9 fL (ref 80.0–100.0)
MPV: 10.4 fL (ref 7.5–12.5)
Monocytes Absolute: 560 cells/uL (ref 200–950)
Monocytes Relative: 7 %
NEUTROS PCT: 60 %
Neutro Abs: 4800 cells/uL (ref 1500–7800)
Platelets: 214 10*3/uL (ref 140–400)
RBC: 3.4 MIL/uL — ABNORMAL LOW (ref 3.80–5.10)
RDW: 15.2 % — ABNORMAL HIGH (ref 11.0–15.0)
WBC: 8 10*3/uL (ref 3.8–10.8)

## 2016-07-21 LAB — COMPLETE METABOLIC PANEL WITH GFR
ALBUMIN: 3.3 g/dL — AB (ref 3.6–5.1)
ALK PHOS: 83 U/L (ref 33–130)
ALT: 5 U/L — ABNORMAL LOW (ref 6–29)
AST: 11 U/L (ref 10–35)
BILIRUBIN TOTAL: 0.4 mg/dL (ref 0.2–1.2)
BUN: 51 mg/dL — ABNORMAL HIGH (ref 7–25)
CO2: 28 mmol/L (ref 20–31)
Calcium: 8.8 mg/dL (ref 8.6–10.4)
Chloride: 102 mmol/L (ref 98–110)
Creat: 2.61 mg/dL — ABNORMAL HIGH (ref 0.60–0.88)
GFR, EST AFRICAN AMERICAN: 18 mL/min — AB (ref >=60)
GFR, EST NON AFRICAN AMERICAN: 15 mL/min — AB (ref >=60)
GLUCOSE: 49 mg/dL — AB (ref 70–99)
POTASSIUM: 4.5 mmol/L (ref 3.5–5.3)
SODIUM: 139 mmol/L (ref 135–146)
TOTAL PROTEIN: 7.2 g/dL (ref 6.1–8.1)

## 2016-07-21 NOTE — Addendum Note (Signed)
Addended by: Shary Decamp B on: 07/21/2016 12:44 PM   Modules accepted: Orders

## 2016-07-21 NOTE — Progress Notes (Signed)
Subjective:    Patient ID: [name], female    DOB: [birth year], 83 y.o.   MRN: [id number]  HPI Patient is a very pleasant 83 year old African-American female here today for a checkup. Past medical history significant for tachybradycardia syndrome status post pacemaker implant.  She also has a history of combined systolic and diastolic congestive heart failure. Her last echocardiogram was performed in 2016 revealed an ejection fraction of 45-50%.  She denies any shortness of breath. She denies any dyspnea on exertion. She denies any orthopnea. She denies any palpitations or chest pain. She denies any syncope or near syncope. Past medical history is also significant for chronic kidney disease with secondary anemia. She denies any melena or hematochezia. I reviewed her medications with the patient and she does not believe that she has been taking the digoxin for many many months. Her heart rate today is well controlled Past Medical History:  Diagnosis Date  . Arthritis   . Cardiomyopathy ([place])    a. ? ischemic vs non-ischemic;  b. 09/2013 Echo: EF 25-30%.  . Chronic systolic CHF (congestive heart failure) ([place])    a. 10/2013 Echo: EF 25-30%, diff HK, Gr 1 DD, triv AI, mild to mod MR, dev dil LA, mild RV dysfxn, mildly dil RA, mod TR.  . CKD (chronic kidney disease), stage III   . COPD (chronic obstructive pulmonary disease) ([place])   . Depression   . GERD (gastroesophageal reflux disease)   . Glaucoma of both eyes   . Gout   . Hypertension   . Kidney stones   . Obesity   . Tachy-brady syndrome ([place])    a. 03/2006 s/p SJM 5356 Verity ADx XLDR DC PPM (ser#: [id number]).   Past Surgical History:  Procedure Laterality Date  . CARDIAC CATHETERIZATION  10/2000   Archie Endo 10/10/2010  . CHOLECYSTECTOMY OPEN    . INSERT / REPLACE / REMOVE PACEMAKER  08/2000; 03/2006   Archie Endo 10/07/2010  . KIDNEY STONE SURGERY     "hospitalized for 19 days"  . TUBAL LIGATION     Current Outpatient Prescriptions on  File Prior to Visit  Medication Sig Dispense Refill  . albuterol (PROVENTIL) (2.5 MG/3ML) 0.083% nebulizer solution INHALE 1 VIAL VIA NEBULIZER EVERY 6 HOURS AS NEEDED FOR WHEEZING AND SHORTNESS OF BREATH 150 mL 4  . allopurinol (ZYLOPRIM) 100 MG tablet TAKE 1 TABLET BY MOUTH ONCE DAILY 30 tablet 5  . amLODipine (NORVASC) 5 MG tablet Take 5 mg by mouth daily.     Marland Kitchen aspirin 81 MG tablet Take 1 tablet (81 mg total) by mouth daily. 30 tablet 11  . carvedilol (COREG) 6.25 MG tablet TAKE 1 TABLET BY MOUTH TWICE DAILY WITH A MEAL 60 tablet 2  . citalopram (CELEXA) 10 MG tablet TAKE 1 TABLET BY MOUTH DAILY 30 tablet 11  . DIGOX 125 MCG tablet TAKE ONE TABLET BY MOUTH DAILY 30 tablet 3  . ferrous sulfate 325 (65 FE) MG EC tablet Take 325 mg by mouth daily with breakfast.    . furosemide (LASIX) 20 MG tablet TAKE ONE TABLET BY MOUTH TWO TIMES DAILY. 60 tablet 3  . OXYGEN Inhale 2 L into the lungs continuous. Reported on 11/29/2015    . pantoprazole (PROTONIX) 40 MG tablet TAKE 1 TABLET BY MOUTH EVERY DAY 30 tablet 11  . SPIRIVA HANDIHALER 18 MCG inhalation capsule INHALE THE CONTENTS OF 1 CAPSULE VIA HANDIHALER BY MOUTH EVERY DAY 30 capsule 5  . SYMBICORT 160-4.5 MCG/ACT inhaler INHALE  2 PUFFS BY MOUTH TWICE DAILY 10.2 g 5   No current facility-administered medications on file prior to visit.    No Known Allergies Social History   Social History  . Marital status: Widowed    Spouse name: N/A  . Number of children: N/A  . Years of education: N/A   Occupational History  . Not on file.   Social History Main Topics  . Smoking status: Former Smoker    Types: Cigarettes  . Smokeless tobacco: Never Used     Comment: "never inhaled cigarettes; just lit them"  . Alcohol use Yes     Comment: 07/23/2014 "I used to drink beer; nothing in 3-4 years"  . Drug use: No  . Sexual activity: No   Other Topics Concern  . Not on file   Social History Narrative  . No narrative on file   Family History    Problem Relation Age of Onset  . Hypertension Mother   . Heart attack Neg Hx   . Stroke Neg Hx       Review of Systems  All other systems reviewed and are negative.      Objective:   Physical Exam  Constitutional: She appears well-developed and well-nourished.  HENT:  Right Ear: External ear normal.  Left Ear: External ear normal.  Nose: Nose normal.  Mouth/Throat: Oropharynx is clear and moist.  Eyes: Conjunctivae are normal.  Neck: Neck supple. No JVD present.  Cardiovascular: Normal rate, regular rhythm and normal heart sounds.   Pulmonary/Chest: Effort normal and breath sounds normal. No respiratory distress. She has no wheezes. She has no rales.  Abdominal: Soft. Bowel sounds are normal. She exhibits no distension. There is no tenderness. There is no rebound and no guarding.  Musculoskeletal: She exhibits no edema.  Lymphadenopathy:    She has no cervical adenopathy.  Vitals reviewed.         Assessment & Plan:  Weight loss - Plan: CBC with Differential/Platelet, COMPLETE METABOLIC PANEL WITH GFR  Essential hypertension  Anemia in stage 4 chronic kidney disease (HCC)  Tachy-brady syndrome (HCC)  Systolic congestive heart failure, NYHA class 3, unspecified congestive heart failure chronicity ([place])  Blood pressure today is well controlled at 136/62. She has no symptoms of uncontrolled decompensated heart failure. Her heart rate is well controlled. She denies any syncope or near-syncope. I will monitor her anemia with a CBC. I will check her renal function with a CMP. She received her flu shot today albeit late. I hesitate to tell the patient to resume digitoxin given her advanced age and her chronic kidney disease particular given the fact her heart rate is well controlled on carvedilol alone. Therefore I will make no changes in her medications at this time

## 2016-07-26 DIAGNOSIS — M2550 Pain in unspecified joint: Secondary | ICD-10-CM | POA: Diagnosis not present

## 2016-07-26 DIAGNOSIS — I5022 Chronic systolic (congestive) heart failure: Secondary | ICD-10-CM | POA: Diagnosis not present

## 2016-07-26 DIAGNOSIS — M6281 Muscle weakness (generalized): Secondary | ICD-10-CM | POA: Diagnosis not present

## 2016-07-26 DIAGNOSIS — J449 Chronic obstructive pulmonary disease, unspecified: Secondary | ICD-10-CM | POA: Diagnosis not present

## 2016-08-07 IMAGING — CR DG ABDOMEN ACUTE W/ 1V CHEST
4 series · 4 of 4 positions shown · non-contrast
Comparison: Chest radiograph performed 07/23/2014

CLINICAL DATA: Acute onset of shortness of breath, nausea and
vomiting. Initial encounter.

EXAM:
DG ABDOMEN ACUTE W/ 1V CHEST

[chest pa]
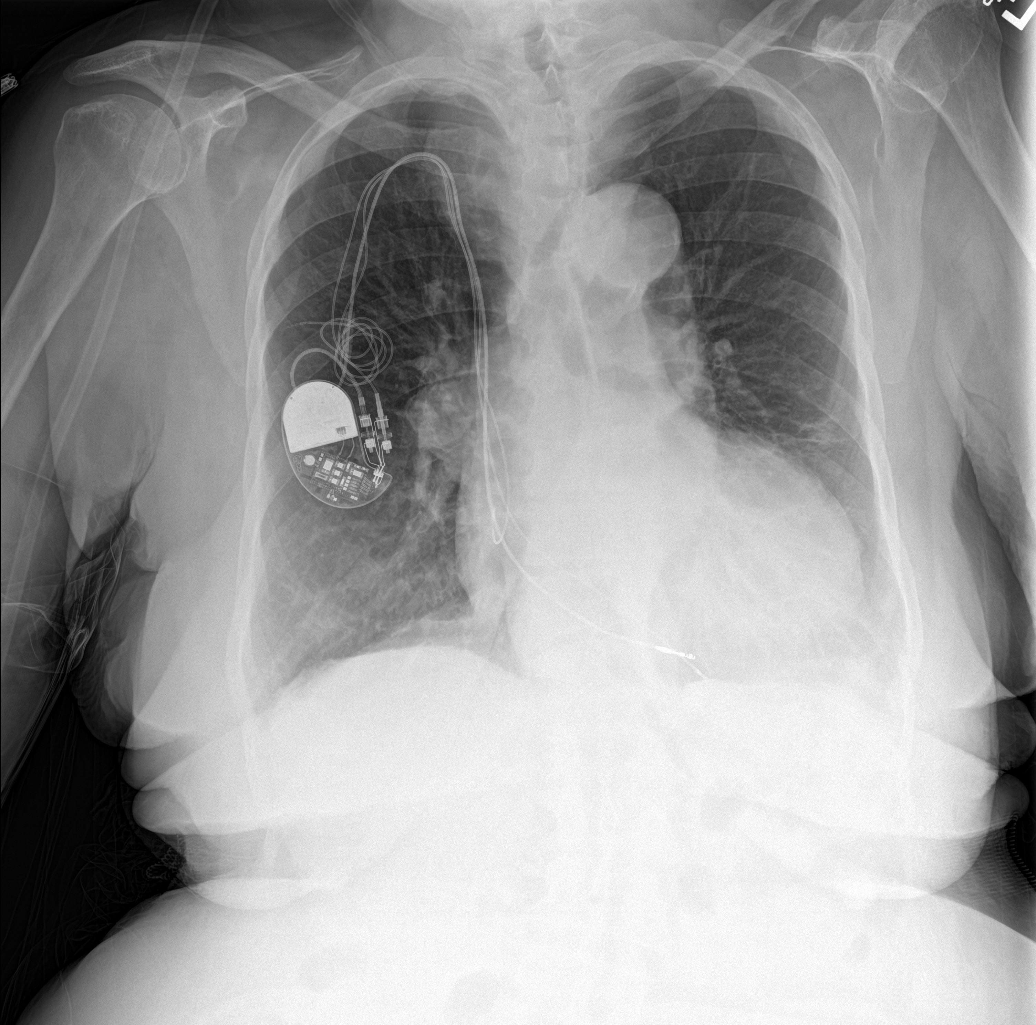

[abdomen erect]
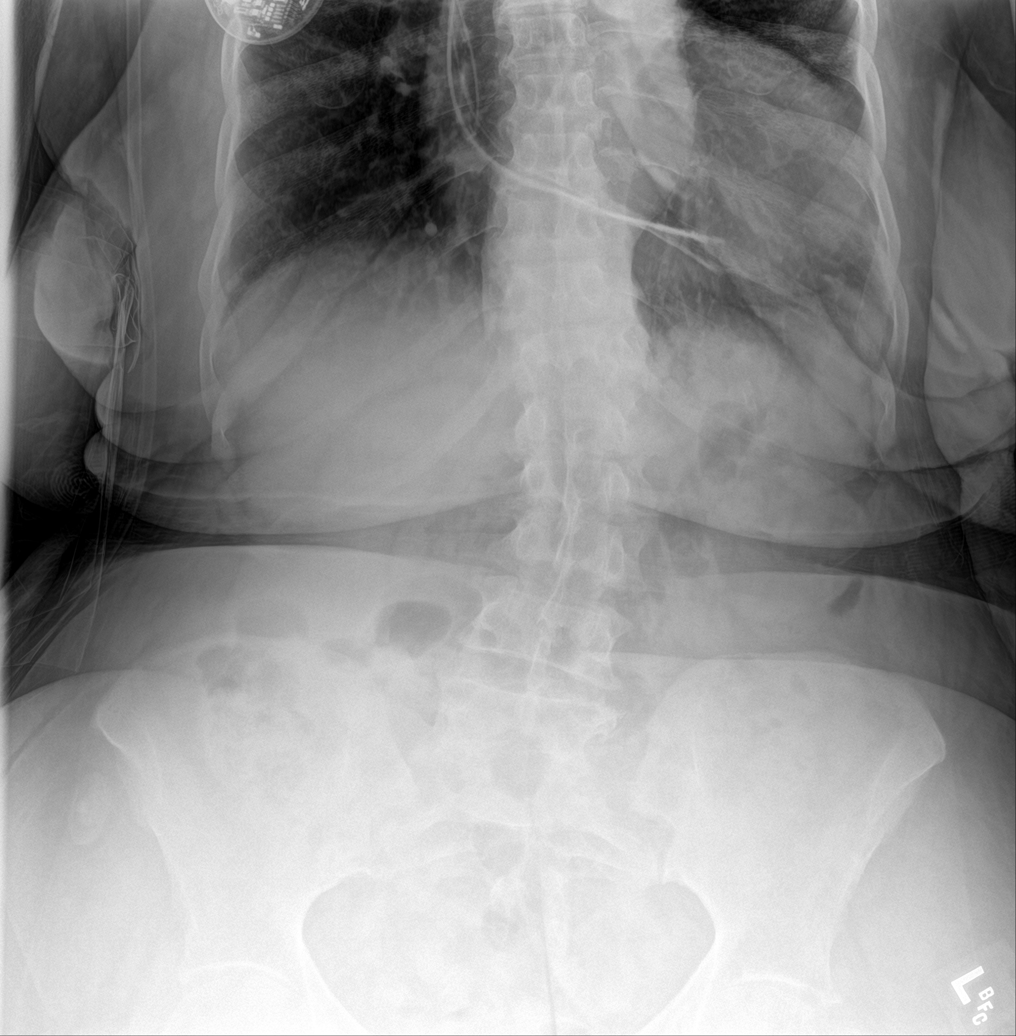

[abdomen supine (1 of 2)]
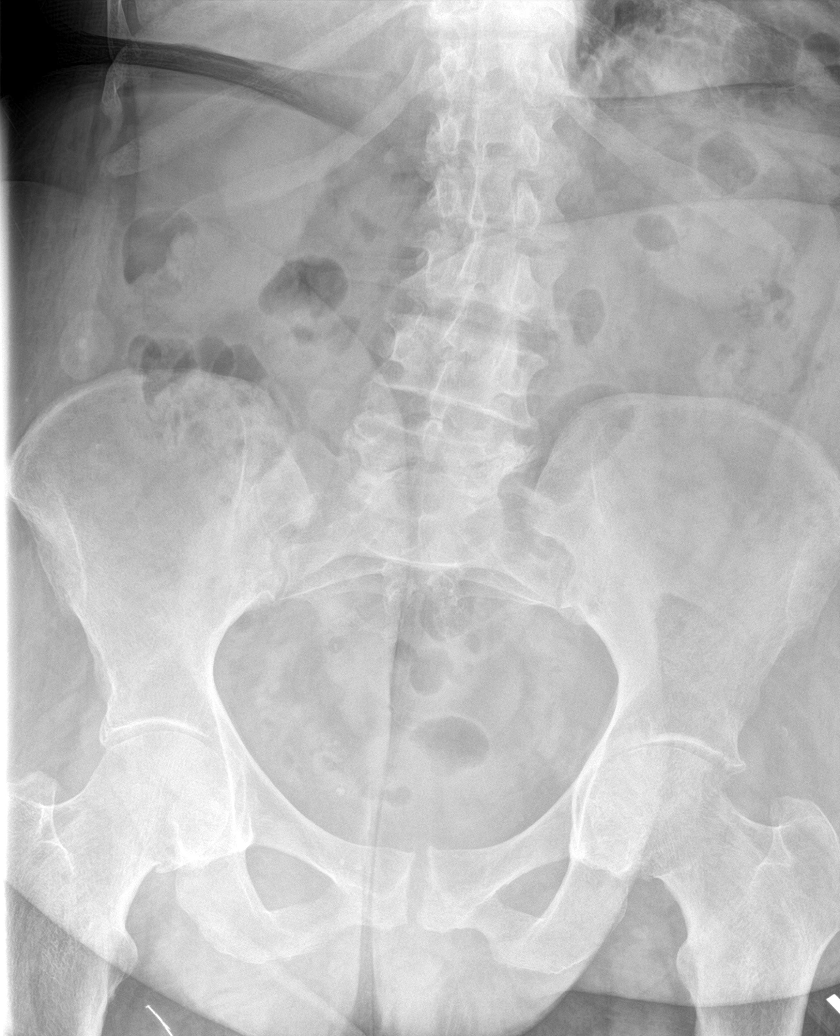

[abdomen supine (2 of 2)]
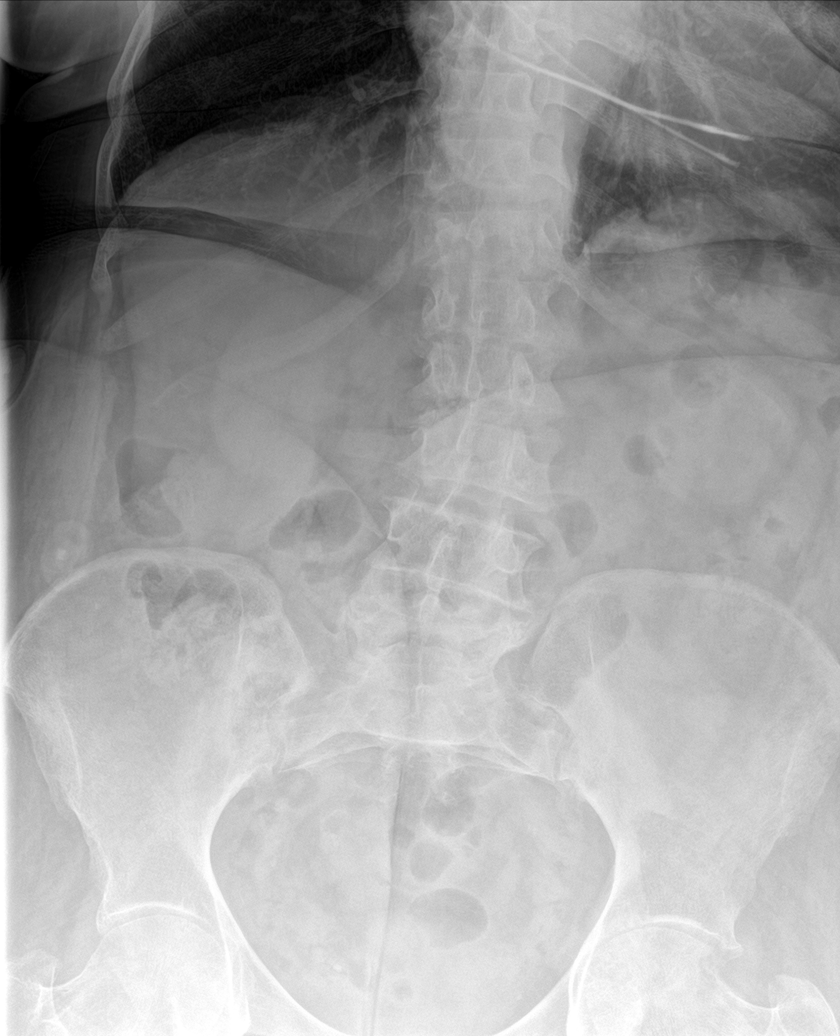

[4 of 4 positions shown; findings below may reference images not displayed]

FINDINGS: The lungs are well-aerated. Mild right basilar and left midlung
atelectasis are noted. There is no evidence of pleural effusion or
pneumothorax. The cardiomediastinal silhouette is mildly enlarged. A
pacemaker is noted overlying the right chest wall, with leads ending
overlying the right ventricle.

The visualized bowel gas pattern is unremarkable. Scattered stool
and air are seen within the colon; there is no evidence of small
bowel dilatation to suggest obstruction. No free intra-abdominal air
is identified on the provided upright view.

No acute osseous abnormalities are seen; the sacroiliac joints are
unremarkable in appearance.
IMPRESSION: 1. Unremarkable bowel gas pattern; no free intra-abdominal air seen.
Small to moderate amount of stool noted in the colon.
2. Mild right basilar and left midlung atelectasis noted.
3. Mild cardiomegaly.

## 2016-08-10 DIAGNOSIS — M6281 Muscle weakness (generalized): Secondary | ICD-10-CM | POA: Diagnosis not present

## 2016-08-10 DIAGNOSIS — I5022 Chronic systolic (congestive) heart failure: Secondary | ICD-10-CM | POA: Diagnosis not present

## 2016-08-10 DIAGNOSIS — M818 Other osteoporosis without current pathological fracture: Secondary | ICD-10-CM | POA: Diagnosis not present

## 2016-08-10 DIAGNOSIS — J449 Chronic obstructive pulmonary disease, unspecified: Secondary | ICD-10-CM | POA: Diagnosis not present

## 2016-08-20 ENCOUNTER — Other Ambulatory Visit: Payer: Self-pay | Admitting: Family Medicine

## 2016-08-26 DIAGNOSIS — M2550 Pain in unspecified joint: Secondary | ICD-10-CM | POA: Diagnosis not present

## 2016-08-26 DIAGNOSIS — M6281 Muscle weakness (generalized): Secondary | ICD-10-CM | POA: Diagnosis not present

## 2016-08-26 DIAGNOSIS — J449 Chronic obstructive pulmonary disease, unspecified: Secondary | ICD-10-CM | POA: Diagnosis not present

## 2016-08-26 DIAGNOSIS — I5022 Chronic systolic (congestive) heart failure: Secondary | ICD-10-CM | POA: Diagnosis not present

## 2016-09-25 DIAGNOSIS — J449 Chronic obstructive pulmonary disease, unspecified: Secondary | ICD-10-CM | POA: Diagnosis not present

## 2016-09-25 DIAGNOSIS — M2550 Pain in unspecified joint: Secondary | ICD-10-CM | POA: Diagnosis not present

## 2016-09-25 DIAGNOSIS — I5022 Chronic systolic (congestive) heart failure: Secondary | ICD-10-CM | POA: Diagnosis not present

## 2016-09-25 DIAGNOSIS — M6281 Muscle weakness (generalized): Secondary | ICD-10-CM | POA: Diagnosis not present

## 2016-09-28 ENCOUNTER — Telehealth: Payer: Self-pay | Admitting: Family Medicine

## 2016-09-28 MED ORDER — FUROSEMIDE 20 MG PO TABS: 20.0000 mg | ORAL_TABLET | Freq: Two times a day (BID) | ORAL | 1 refills | Status: DC

## 2016-09-28 MED ORDER — TIOTROPIUM BROMIDE MONOHYDRATE 18 MCG IN CAPS: ORAL_CAPSULE | RESPIRATORY_TRACT | 1 refills | Status: AC

## 2016-09-28 MED ORDER — ALLOPURINOL 100 MG PO TABS: 100.0000 mg | ORAL_TABLET | Freq: Every day | ORAL | 1 refills | Status: DC

## 2016-09-28 MED ORDER — ASPIRIN 81 MG PO TABS: 81.0000 mg | ORAL_TABLET | Freq: Every day | ORAL | 11 refills | Status: AC

## 2016-09-28 MED ORDER — BUDESONIDE-FORMOTEROL FUMARATE 160-4.5 MCG/ACT IN AERO: 2.0000 | INHALATION_SPRAY | Freq: Two times a day (BID) | RESPIRATORY_TRACT | 1 refills | Status: AC

## 2016-09-28 MED ORDER — PANTOPRAZOLE SODIUM 40 MG PO TBEC: 40.0000 mg | DELAYED_RELEASE_TABLET | Freq: Every day | ORAL | 1 refills | Status: DC

## 2016-09-28 MED ORDER — CARVEDILOL 6.25 MG PO TABS: ORAL_TABLET | ORAL | 1 refills | Status: DC

## 2016-09-28 MED ORDER — CITALOPRAM HYDROBROMIDE 10 MG PO TABS: 10.0000 mg | ORAL_TABLET | Freq: Every day | ORAL | 1 refills | Status: DC

## 2016-09-28 MED ORDER — AMLODIPINE BESYLATE 5 MG PO TABS: 5.0000 mg | ORAL_TABLET | Freq: Every day | ORAL | 1 refills | Status: DC
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
## 2016-09-29 ENCOUNTER — Encounter: Payer: Self-pay | Admitting: Family Medicine

## 2016-09-29 NOTE — Telephone Encounter (Signed)
This encounter was created in error - please disregard.

## 2016-10-26 DIAGNOSIS — M6281 Muscle weakness (generalized): Secondary | ICD-10-CM | POA: Diagnosis not present

## 2016-10-26 DIAGNOSIS — M2550 Pain in unspecified joint: Secondary | ICD-10-CM | POA: Diagnosis not present

## 2016-10-26 DIAGNOSIS — I5022 Chronic systolic (congestive) heart failure: Secondary | ICD-10-CM | POA: Diagnosis not present

## 2016-10-26 DIAGNOSIS — J449 Chronic obstructive pulmonary disease, unspecified: Secondary | ICD-10-CM | POA: Diagnosis not present

## 2016-10-29 ENCOUNTER — Other Ambulatory Visit: Payer: Self-pay | Admitting: Family Medicine

## 2016-11-05 DIAGNOSIS — M818 Other osteoporosis without current pathological fracture: Secondary | ICD-10-CM | POA: Diagnosis not present

## 2016-11-05 DIAGNOSIS — M6281 Muscle weakness (generalized): Secondary | ICD-10-CM | POA: Diagnosis not present

## 2016-11-05 DIAGNOSIS — I5022 Chronic systolic (congestive) heart failure: Secondary | ICD-10-CM | POA: Diagnosis not present

## 2016-11-05 DIAGNOSIS — J449 Chronic obstructive pulmonary disease, unspecified: Secondary | ICD-10-CM | POA: Diagnosis not present

## 2016-11-10 ENCOUNTER — Ambulatory Visit (INDEPENDENT_AMBULATORY_CARE_PROVIDER_SITE_OTHER): Payer: Medicare Other | Admitting: Family Medicine

## 2016-11-10 ENCOUNTER — Ambulatory Visit
Admission: RE | Admit: 2016-11-10 | Discharge: 2016-11-10 | Disposition: A | Discharge status: Home / Self Care | Payer: Medicare Other | Source: Ambulatory Visit | Attending: Family Medicine | Admitting: Family Medicine

## 2016-11-10 ENCOUNTER — Encounter: Payer: Self-pay | Admitting: Family Medicine

## 2016-11-10 VITALS — BP 130/72 | HR 88 | Temp 97.7°F | Resp 22 | Ht 61.5 in | Wt 185.0 lb

## 2016-11-10 DIAGNOSIS — R5383 Other fatigue: Secondary | ICD-10-CM

## 2016-11-10 DIAGNOSIS — R0602 Shortness of breath: Secondary | ICD-10-CM | POA: Diagnosis not present

## 2016-11-10 NOTE — Progress Notes (Addendum)
Subjective:    Patient ID: [name], female    DOB: Jul 09, 1922, 83 y.o.   MRN: [id number]  HPI This is a very difficult history. Patient presents today stating that she feels tired. She feels weak. However she is unable to explain further. Therefore I had to do a detailed review of systems to try to narrow down on the potential causes. From a cardiovascular standpoint, she denies any chest pain shortness of breath or dyspnea on exertion. She denies any syncope. She denies any palpitations. From a respiratory standpoint, the patient denies any shortness of breath, cough, or pleurisy. From a gastrointestinal standpoint, the patient denies any abdominal pain, nausea, vomiting, or diarrhea. She denies any constipation. From a urinary standpoint, the patient denies any dysuria, frequency, urgency. From a neurologic standpoint, the patient denies any neurologic deficit, headache, numbness, tingling, seizure like activity, or altered mental status. From musculoskeletal standpoint, the patient denies any joint pains, rashes, or swelling. From a constitutional standpoint, the patient denies any weight loss. She denies any change in her appetite. She does report malaise and fatigue and hypersomnolence Wt Readings from Last 3 Encounters:  11/10/16 185 lb (83.9 kg)  07/21/16 192 lb (87.1 kg)  03/10/16 200 lb (90.7 kg)   However the patient has lost 15 pounds since October of last year. Past Medical History:  Diagnosis Date  . Arthritis   . Cardiomyopathy ([place])    a. ? ischemic vs non-ischemic;  b. 09/2013 Echo: EF 25-30%.  . Chronic systolic CHF (congestive heart failure) ([place])    a. 10/2013 Echo: EF 25-30%, diff HK, Gr 1 DD, triv AI, mild to mod MR, dev dil LA, mild RV dysfxn, mildly dil RA, mod TR.  . CKD (chronic kidney disease), stage III   . COPD (chronic obstructive pulmonary disease) ([place])   . Depression   . GERD (gastroesophageal reflux disease)   . Glaucoma of both eyes   . Gout   .  Hypertension   . Kidney stones   . Obesity   . Tachy-brady syndrome ([place])    a. 03/2006 s/p SJM 5356 Verity ADx XLDR DC PPM (ser#: [id number]).   Past Surgical History:  Procedure Laterality Date  . CARDIAC CATHETERIZATION  10/2000   Archie Endo 10/10/2010  . CHOLECYSTECTOMY OPEN    . INSERT / REPLACE / REMOVE PACEMAKER  08/2000; 03/2006   Archie Endo 10/07/2010  . KIDNEY STONE SURGERY     "hospitalized for 19 days"  . TUBAL LIGATION     Current Outpatient Prescriptions on File Prior to Visit  Medication Sig Dispense Refill  . albuterol (PROVENTIL) (2.5 MG/3ML) 0.083% nebulizer solution INHALE 1 VIAL VIA NEBULIZER EVERY 6 HOURS AS NEEDED FOR WHEEZING AND SHORTNESS OF BREATH 150 mL 4  . allopurinol (ZYLOPRIM) 100 MG tablet Take 1 tablet (100 mg total) by mouth daily. 30 tablet 1  . amLODipine (NORVASC) 5 MG tablet Take 1 tablet (5 mg total) by mouth daily. 30 tablet 1  . amLODipine (NORVASC) 5 MG tablet TAKE ONE TABLET BY MOUTH ONCE DAILY. 90 tablet 3  . aspirin 81 MG tablet Take 1 tablet (81 mg total) by mouth daily. 30 tablet 11  . budesonide-formoterol (SYMBICORT) 160-4.5 MCG/ACT inhaler Inhale 2 puffs into the lungs 2 (two) times daily. 10.2 g 1  . carvedilol (COREG) 6.25 MG tablet TAKE 1 TABLET BY MOUTH TWICE DAILY WITH A MEAL 60 tablet 1  . citalopram (CELEXA) 10 MG tablet Take 1 tablet (10 mg total) by mouth  daily. 30 tablet 1  . DIGOX 125 MCG tablet TAKE ONE TABLET BY MOUTH DAILY 30 tablet 3  . ferrous sulfate 325 (65 FE) MG EC tablet Take 325 mg by mouth daily with breakfast.    . furosemide (LASIX) 20 MG tablet Take 1 tablet (20 mg total) by mouth 2 (two) times daily. 60 tablet 1  . OXYGEN Inhale 2 L into the lungs continuous. Reported on 11/29/2015    . pantoprazole (PROTONIX) 40 MG tablet Take 1 tablet (40 mg total) by mouth daily. 30 tablet 1  . tiotropium (SPIRIVA HANDIHALER) 18 MCG inhalation capsule INHALE THE CONTENTS OF 1 CAPSULE VIA HANDIHALER BY MOUTH EVERY DAY 30 capsule 1   No  current facility-administered medications on file prior to visit.    No Known Allergies Social History   Social History  . Marital status: Widowed    Spouse name: N/A  . Number of children: N/A  . Years of education: N/A   Occupational History  . Not on file.   Social History Main Topics  . Smoking status: Former Smoker    Types: Cigarettes  . Smokeless tobacco: Never Used     Comment: "never inhaled cigarettes; just lit them"  . Alcohol use Yes     Comment: 07/23/2014 "I used to drink beer; nothing in 3-4 years"  . Drug use: No  . Sexual activity: No   Other Topics Concern  . Not on file   Social History Narrative  . No narrative on file      Review of Systems  All other systems reviewed and are negative.      Objective:   Physical Exam  Constitutional: She appears lethargic.  HENT:  Right Ear: Tympanic membrane and ear canal normal.  Left Ear: Tympanic membrane and ear canal normal.  Nose: Nose normal. No mucosal edema or rhinorrhea.  Mouth/Throat: Oropharynx is clear and moist and mucous membranes are normal.  Cardiovascular: Normal rate, regular rhythm and normal heart sounds.   Pulmonary/Chest: Effort normal. No respiratory distress. She has no wheezes. She has no rhonchi. She has no rales.  Abdominal: Soft. Bowel sounds are normal. She exhibits no distension. There is no tenderness. There is no rebound and no guarding.  Musculoskeletal: She exhibits no edema.  Neurological: She has normal strength. She appears lethargic. She displays no tremor. No cranial nerve deficit or sensory deficit. She exhibits normal muscle tone.  Vitals reviewed.         Assessment & Plan:  Fatigue, unspecified type - Plan: CBC with Differential/Platelet, COMPLETE METABOLIC PANEL WITH GFR, Urinalysis, Routine w reflex microscopic, DG Chest 2 View, Ammonia Differential diagnosis is large. Patient is unable to provide any background to help narrow the differential diagnosis.  Symptoms could be attributable to end-of-life changes secondary to her advanced age. However we will look for reversible causes. I will check an ammonia level to evaluate for any signs of liver failure, I will check a CMP to evaluate for any signs of renal insufficiency or acute renal failure. I will check a CBC to evaluate for any leukocytosis, anemia, or bone marrow abnormalities. I'll check a urinalysis to look for urinary tract infection. I'll check a chest x-ray to evaluate for an occult pneumonia. If the above workup is negative, I will attribute the patient's symptoms to her advanced age.  She may be approaching the end of her life and the hypersomnolence may be a sign of this.  Daughter understands but would liek to look  for reversible/treatable causes.    Addendum Urinalysis demonstrates urinary tract infection. Due to her chronic kidney disease, patient will be treated with Cipro 250 mg by mouth daily for 7 days. Patient is requesting a manual wheelchair. Due to her advanced age and degenerative joint disease, the patient has a mobility limitation is significantly appears her ability to perform ADLs such as going to the bathroom, dressing, and bathing. She is unable to safely ambulate using a cane or walker for long distances. She also has weakness in her upper extremities that would make it unsafe for her to simply use a cane. She is safely able to transfer from her wheelchair. They're sufficient room in her home for a wheelchair. Therefore I will give the patient an order for a wheelchair to significantly improve her quality of life and her ability to perform ADLs

## 2016-11-11 ENCOUNTER — Other Ambulatory Visit: Payer: Self-pay | Admitting: Family Medicine

## 2016-11-11 LAB — CBC WITH DIFFERENTIAL/PLATELET
BASOS PCT: 0 %
Basophils Absolute: 0 cells/uL (ref 0–200)
EOS ABS: 460 {cells}/uL (ref 15–500)
Eosinophils Relative: 5 %
HEMATOCRIT: 34 % — AB (ref 35.0–45.0)
HEMOGLOBIN: 10.6 g/dL — AB (ref 12.0–15.0)
LYMPHS ABS: 3220 {cells}/uL (ref 850–3900)
Lymphocytes Relative: 35 %
MCH: 29.5 pg (ref 27.0–33.0)
MCHC: 31.2 g/dL — ABNORMAL LOW (ref 32.0–36.0)
MCV: 94.7 fL (ref 80.0–100.0)
MONO ABS: 736 {cells}/uL (ref 200–950)
MONOS PCT: 8 %
MPV: 11.1 fL (ref 7.5–12.5)
NEUTROS ABS: 4784 {cells}/uL (ref 1500–7800)
Neutrophils Relative %: 52 %
PLATELETS: 261 10*3/uL (ref 140–400)
RBC: 3.59 MIL/uL — ABNORMAL LOW (ref 3.80–5.10)
RDW: 14.9 % (ref 11.0–15.0)
WBC: 9.2 10*3/uL (ref 3.8–10.8)

## 2016-11-11 LAB — COMPLETE METABOLIC PANEL WITH GFR
ALBUMIN: 3.5 g/dL — AB (ref 3.6–5.1)
ALT: 5 U/L — AB (ref 6–29)
AST: 10 U/L (ref 10–35)
Alkaline Phosphatase: 98 U/L (ref 33–130)
BILIRUBIN TOTAL: 0.4 mg/dL (ref 0.2–1.2)
BUN: 42 mg/dL — ABNORMAL HIGH (ref 7–25)
CO2: 27 mmol/L (ref 20–31)
CREATININE: 2.19 mg/dL — AB (ref 0.60–0.88)
Calcium: 8.6 mg/dL (ref 8.6–10.4)
Chloride: 103 mmol/L (ref 98–110)
GFR, Est African American: 22 mL/min — ABNORMAL LOW (ref >=60)
GFR, Est Non African American: 19 mL/min — ABNORMAL LOW (ref >=60)
Glucose, Bld: 68 mg/dL — ABNORMAL LOW (ref 70–99)
Potassium: 4.1 mmol/L (ref 3.5–5.3)
Sodium: 142 mmol/L (ref 135–146)
TOTAL PROTEIN: 7.7 g/dL (ref 6.1–8.1)

## 2016-11-11 LAB — URINALYSIS, ROUTINE W REFLEX MICROSCOPIC
Bilirubin Urine: NEGATIVE
GLUCOSE, UA: NEGATIVE
HGB URINE DIPSTICK: NEGATIVE
KETONES UR: NEGATIVE
NITRITE: NEGATIVE
PH: 5 (ref 5.0–8.0)
Protein, ur: NEGATIVE
Specific Gravity, Urine: 1.014 (ref 1.001–1.035)

## 2016-11-11 LAB — URINALYSIS, MICROSCOPIC ONLY
CASTS: NONE SEEN [LPF]
Crystals: NONE SEEN [HPF]
YEAST: NONE SEEN [HPF]

## 2016-11-11 LAB — AMMONIA: Ammonia: 50 umol/L — ABNORMAL HIGH (ref <=47)

## 2016-11-11 MED ORDER — CIPROFLOXACIN HCL 250 MG PO TABS: 250.0000 mg | ORAL_TABLET | Freq: Every day | ORAL | 0 refills | Status: DC

## 2016-11-12 ENCOUNTER — Ambulatory Visit: Payer: Medicare Other | Admitting: Family Medicine

## 2016-11-25 DIAGNOSIS — J449 Chronic obstructive pulmonary disease, unspecified: Secondary | ICD-10-CM | POA: Diagnosis not present

## 2016-11-25 DIAGNOSIS — M6281 Muscle weakness (generalized): Secondary | ICD-10-CM | POA: Diagnosis not present

## 2016-11-25 DIAGNOSIS — M2550 Pain in unspecified joint: Secondary | ICD-10-CM | POA: Diagnosis not present

## 2016-11-25 DIAGNOSIS — I5022 Chronic systolic (congestive) heart failure: Secondary | ICD-10-CM | POA: Diagnosis not present

## 2016-12-07 ENCOUNTER — Ambulatory Visit: Payer: Medicare Other | Admitting: Podiatry

## 2016-12-09 ENCOUNTER — Encounter: Payer: Self-pay | Admitting: Podiatry

## 2016-12-09 ENCOUNTER — Ambulatory Visit (INDEPENDENT_AMBULATORY_CARE_PROVIDER_SITE_OTHER): Payer: Medicare Other | Admitting: Podiatry

## 2016-12-09 DIAGNOSIS — B351 Tinea unguium: Secondary | ICD-10-CM

## 2016-12-09 DIAGNOSIS — M79671 Pain in right foot: Secondary | ICD-10-CM | POA: Diagnosis not present

## 2016-12-09 DIAGNOSIS — L602 Onychogryphosis: Secondary | ICD-10-CM

## 2016-12-09 DIAGNOSIS — M79672 Pain in left foot: Secondary | ICD-10-CM | POA: Diagnosis not present

## 2016-12-09 NOTE — Progress Notes (Signed)
Subjective: 83 y.o. year old female patient presents complaining of painful nails.  Her last visit to this office was in 04/14/13.  Review of Systems - General ROS: negative for - chills, fatigue, fever, night sweats, sleep disturbance, weight gain or weight loss Ophthalmic ROS: negative ENT ROS: negative Allergy and Immunology ROS: negative Breast ROS: negative for breast lumps Respiratory ROS: no cough, shortness of breath, or wheezing Cardiovascular ROS: Wears pace maker. Gastrointestinal ROS: Take Pepsid for acid. Musculoskeletal ROS: positive for - gait disturbance, joint stiffness and pain in right knee. Neurological ROS: no TIA or stroke symptoms Dermatological ROS: negative  Objective: Dermatologic:  Neglected thick dark deformed nails x 10 with foul odor. All nails are yellow turning and 2" long. Plantar calluses under the first MPJ bilateral. Vascular: Pedal pulses are all palpable with mild forefoot edema. Orthopedic: Severe hallux valgus with bunion, contracted lesser digits bilateral. Neurologic: All epicritic and tactile sensations grossly intact.  Assessment: Neglected dystrophic mycotic nails x 10. Plantar callus under the first MPJ bilateral.  Treatment: All mycotic nails and calluses debrided.  Return in 3 months or as needed.

## 2016-12-09 NOTE — Patient Instructions (Signed)
Seen for hypertrophic nails. All nails debrided. Return in 3 months or as needed.  

## 2016-12-10 ENCOUNTER — Other Ambulatory Visit: Payer: Self-pay | Admitting: Family Medicine

## 2016-12-15 DIAGNOSIS — I5022 Chronic systolic (congestive) heart failure: Secondary | ICD-10-CM | POA: Diagnosis not present

## 2016-12-15 DIAGNOSIS — M818 Other osteoporosis without current pathological fracture: Secondary | ICD-10-CM | POA: Diagnosis not present

## 2016-12-15 DIAGNOSIS — J449 Chronic obstructive pulmonary disease, unspecified: Secondary | ICD-10-CM | POA: Diagnosis not present

## 2016-12-15 DIAGNOSIS — M6281 Muscle weakness (generalized): Secondary | ICD-10-CM | POA: Diagnosis not present

## 2016-12-26 DIAGNOSIS — J449 Chronic obstructive pulmonary disease, unspecified: Secondary | ICD-10-CM | POA: Diagnosis not present

## 2016-12-26 DIAGNOSIS — I5022 Chronic systolic (congestive) heart failure: Secondary | ICD-10-CM | POA: Diagnosis not present

## 2016-12-26 DIAGNOSIS — M6281 Muscle weakness (generalized): Secondary | ICD-10-CM | POA: Diagnosis not present

## 2016-12-26 DIAGNOSIS — M2550 Pain in unspecified joint: Secondary | ICD-10-CM | POA: Diagnosis not present

## 2017-01-04 DIAGNOSIS — R269 Unspecified abnormalities of gait and mobility: Secondary | ICD-10-CM | POA: Diagnosis not present

## 2017-01-04 DIAGNOSIS — J449 Chronic obstructive pulmonary disease, unspecified: Secondary | ICD-10-CM | POA: Diagnosis not present

## 2017-01-04 DIAGNOSIS — R2689 Other abnormalities of gait and mobility: Secondary | ICD-10-CM | POA: Diagnosis not present

## 2017-01-04 DIAGNOSIS — R260 Ataxic gait: Secondary | ICD-10-CM | POA: Diagnosis not present

## 2017-01-07 ENCOUNTER — Telehealth: Payer: Self-pay | Admitting: Family Medicine

## 2017-01-07 NOTE — Telephone Encounter (Signed)
New Message  Pts daughter voiced needing referral to Gastroenologist due to pt bleeding from rectum.  Please f/u

## 2017-01-08 NOTE — Telephone Encounter (Signed)
Spoke to [place] and pt is having some trouble keeping food down and some blood with BM's. She wanted a referral to [facility]. I informed her we needed to see her first and appointment made for Monday but informed it symptoms get worse to go to ER.

## 2017-01-11 ENCOUNTER — Ambulatory Visit: Payer: Medicare Other | Admitting: Family Medicine

## 2017-01-18 ENCOUNTER — Encounter: Payer: Self-pay | Admitting: Family Medicine

## 2017-01-18 DIAGNOSIS — M818 Other osteoporosis without current pathological fracture: Secondary | ICD-10-CM | POA: Diagnosis not present

## 2017-01-18 DIAGNOSIS — M6281 Muscle weakness (generalized): Secondary | ICD-10-CM | POA: Diagnosis not present

## 2017-01-18 DIAGNOSIS — I5022 Chronic systolic (congestive) heart failure: Secondary | ICD-10-CM | POA: Diagnosis not present

## 2017-01-18 DIAGNOSIS — J449 Chronic obstructive pulmonary disease, unspecified: Secondary | ICD-10-CM | POA: Diagnosis not present

## 2017-01-26 DIAGNOSIS — I5022 Chronic systolic (congestive) heart failure: Secondary | ICD-10-CM | POA: Diagnosis not present

## 2017-01-26 DIAGNOSIS — M2550 Pain in unspecified joint: Secondary | ICD-10-CM | POA: Diagnosis not present

## 2017-02-02 ENCOUNTER — Other Ambulatory Visit: Payer: Self-pay | Admitting: Family Medicine

## 2017-02-25 DIAGNOSIS — I5022 Chronic systolic (congestive) heart failure: Secondary | ICD-10-CM | POA: Diagnosis not present

## 2017-02-25 DIAGNOSIS — M2550 Pain in unspecified joint: Secondary | ICD-10-CM | POA: Diagnosis not present

## 2017-02-26 DIAGNOSIS — J449 Chronic obstructive pulmonary disease, unspecified: Secondary | ICD-10-CM | POA: Diagnosis not present

## 2017-02-26 DIAGNOSIS — M818 Other osteoporosis without current pathological fracture: Secondary | ICD-10-CM | POA: Diagnosis not present

## 2017-02-26 DIAGNOSIS — I5022 Chronic systolic (congestive) heart failure: Secondary | ICD-10-CM | POA: Diagnosis not present

## 2017-02-26 DIAGNOSIS — M6281 Muscle weakness (generalized): Secondary | ICD-10-CM | POA: Diagnosis not present

## 2017-03-28 DIAGNOSIS — M2550 Pain in unspecified joint: Secondary | ICD-10-CM | POA: Diagnosis not present

## 2017-03-28 DIAGNOSIS — I5022 Chronic systolic (congestive) heart failure: Secondary | ICD-10-CM | POA: Diagnosis not present

## 2017-04-14 DIAGNOSIS — J449 Chronic obstructive pulmonary disease, unspecified: Secondary | ICD-10-CM | POA: Diagnosis not present

## 2017-04-14 DIAGNOSIS — I5022 Chronic systolic (congestive) heart failure: Secondary | ICD-10-CM | POA: Diagnosis not present

## 2017-04-14 DIAGNOSIS — M818 Other osteoporosis without current pathological fracture: Secondary | ICD-10-CM | POA: Diagnosis not present

## 2017-04-14 DIAGNOSIS — M6281 Muscle weakness (generalized): Secondary | ICD-10-CM | POA: Diagnosis not present

## 2017-04-27 DIAGNOSIS — M2550 Pain in unspecified joint: Secondary | ICD-10-CM | POA: Diagnosis not present

## 2017-04-27 DIAGNOSIS — I5022 Chronic systolic (congestive) heart failure: Secondary | ICD-10-CM | POA: Diagnosis not present

## 2017-04-28 ENCOUNTER — Other Ambulatory Visit: Payer: Self-pay | Admitting: Family Medicine

## 2017-05-24 ENCOUNTER — Other Ambulatory Visit: Payer: Self-pay | Admitting: Family Medicine

## 2017-05-28 DIAGNOSIS — M2550 Pain in unspecified joint: Secondary | ICD-10-CM | POA: Diagnosis not present

## 2017-05-28 DIAGNOSIS — I5022 Chronic systolic (congestive) heart failure: Secondary | ICD-10-CM | POA: Diagnosis not present

## 2017-06-15 ENCOUNTER — Ambulatory Visit: Payer: Medicare Other | Admitting: Family Medicine

## 2017-06-21 ENCOUNTER — Ambulatory Visit (INDEPENDENT_AMBULATORY_CARE_PROVIDER_SITE_OTHER): Payer: Medicare Other | Admitting: Family Medicine

## 2017-06-21 ENCOUNTER — Encounter: Payer: Self-pay | Admitting: Family Medicine

## 2017-06-21 VITALS — BP 136/76 | HR 78 | Temp 98.1°F | Resp 20 | Ht 61.5 in | Wt 182.0 lb

## 2017-06-21 DIAGNOSIS — I10 Essential (primary) hypertension: Secondary | ICD-10-CM

## 2017-06-21 DIAGNOSIS — I5020 Unspecified systolic (congestive) heart failure: Secondary | ICD-10-CM

## 2017-06-21 DIAGNOSIS — D631 Anemia in chronic kidney disease: Secondary | ICD-10-CM

## 2017-06-21 DIAGNOSIS — Z23 Encounter for immunization: Secondary | ICD-10-CM | POA: Diagnosis not present

## 2017-06-21 DIAGNOSIS — N184 Chronic kidney disease, stage 4 (severe): Secondary | ICD-10-CM | POA: Diagnosis not present

## 2017-06-21 LAB — COMPLETE METABOLIC PANEL WITH GFR
AG Ratio: 0.9 (calc) — ABNORMAL LOW (ref 1.0–2.5)
ALBUMIN MSPROF: 3.5 g/dL — AB (ref 3.6–5.1)
ALKALINE PHOSPHATASE (APISO): 97 U/L (ref 33–130)
ALT: 4 U/L — ABNORMAL LOW (ref 6–29)
AST: 9 U/L — AB (ref 10–35)
BUN / CREAT RATIO: 22 (calc) (ref 6–22)
BUN: 49 mg/dL — ABNORMAL HIGH (ref 7–25)
CO2: 30 mmol/L (ref 20–32)
Calcium: 8.3 mg/dL — ABNORMAL LOW (ref 8.6–10.4)
Chloride: 104 mmol/L (ref 98–110)
Creat: 2.23 mg/dL — ABNORMAL HIGH (ref 0.60–0.88)
GFR, Est African American: 21 mL/min/{1.73_m2} — ABNORMAL LOW (ref >=60)
GFR, Est Non African American: 19 mL/min/{1.73_m2} — ABNORMAL LOW (ref >=60)
GLOBULIN: 3.9 g/dL — AB (ref 1.9–3.7)
Glucose, Bld: 56 mg/dL — ABNORMAL LOW (ref 65–99)
POTASSIUM: 4.6 mmol/L (ref 3.5–5.3)
SODIUM: 140 mmol/L (ref 135–146)
Total Bilirubin: 0.4 mg/dL (ref 0.2–1.2)
Total Protein: 7.4 g/dL (ref 6.1–8.1)

## 2017-06-21 LAB — CBC WITH DIFFERENTIAL/PLATELET
BASOS ABS: 38 {cells}/uL (ref 0–200)
Basophils Relative: 0.5 %
Eosinophils Absolute: 464 cells/uL (ref 15–500)
Eosinophils Relative: 6.1 %
HEMATOCRIT: 32.2 % — AB (ref 35.0–45.0)
HEMOGLOBIN: 10.3 g/dL — AB (ref 11.7–15.5)
LYMPHS ABS: 2326 {cells}/uL (ref 850–3900)
MCH: 29.6 pg (ref 27.0–33.0)
MCHC: 32 g/dL (ref 32.0–36.0)
MCV: 92.5 fL (ref 80.0–100.0)
MPV: 10.9 fL (ref 7.5–12.5)
Monocytes Relative: 10.8 %
NEUTROS PCT: 52 %
Neutro Abs: 3952 cells/uL (ref 1500–7800)
Platelets: 195 10*3/uL (ref 140–400)
RBC: 3.48 10*6/uL — ABNORMAL LOW (ref 3.80–5.10)
RDW: 13.1 % (ref 11.0–15.0)
Total Lymphocyte: 30.6 %
WBC: 7.6 10*3/uL (ref 3.8–10.8)
WBCMIX: 821 {cells}/uL (ref 200–950)

## 2017-06-21 NOTE — Progress Notes (Signed)
Subjective:    Patient ID: [name], female    DOB: 1922/09/27, 84 y.o.   MRN: [id number]  HPI  10/2016 This is a very difficult history. Patient presents today stating that she feels tired. She feels weak. However she is unable to explain further. Therefore I had to do a detailed review of systems to try to narrow down on the potential causes. From a cardiovascular standpoint, she denies any chest pain shortness of breath or dyspnea on exertion. She denies any syncope. She denies any palpitations. From a respiratory standpoint, the patient denies any shortness of breath, cough, or pleurisy. From a gastrointestinal standpoint, the patient denies any abdominal pain, nausea, vomiting, or diarrhea. She denies any constipation. From a urinary standpoint, the patient denies any dysuria, frequency, urgency. From a neurologic standpoint, the patient denies any neurologic deficit, headache, numbness, tingling, seizure like activity, or altered mental status. From musculoskeletal standpoint, the patient denies any joint pains, rashes, or swelling. From a constitutional standpoint, the patient denies any weight loss. She denies any change in her appetite. She does report malaise and fatigue and hypersomnolence Wt Readings from Last 3 Encounters:  06/21/17 182 lb (82.6 kg)  11/10/16 185 lb (83.9 kg)  07/21/16 192 lb (87.1 kg)   However the patient has lost 15 pounds since October of last year.  At that time, my plan was: Differential diagnosis is large. Patient is unable to provide any background to help narrow the differential diagnosis. Symptoms could be attributable to end-of-life changes secondary to her advanced age. However we will look for reversible causes. I will check an ammonia level to evaluate for any signs of liver failure, I will check a CMP to evaluate for any signs of renal insufficiency or acute renal failure. I will check a CBC to evaluate for any leukocytosis, anemia, or bone marrow  abnormalities. I'll check a urinalysis to look for urinary tract infection. I'll check a chest x-ray to evaluate for an occult pneumonia. If the above workup is negative, I will attribute the patient's symptoms to her advanced age.  She may be approaching the end of her life and the hypersomnolence may be a sign of this.  Daughter understands but would liek to look for reversible/treatable causes.    Addendum Urinalysis demonstrates urinary tract infection. Due to her chronic kidney disease, patient will be treated with Cipro 250 mg by mouth daily for 7 days. Patient is requesting a manual wheelchair. Due to her advanced age and degenerative joint disease, the patient has a mobility limitation is significantly appears her ability to perform ADLs such as going to the bathroom, dressing, and bathing. She is unable to safely ambulate using a cane or walker for long distances. She also has weakness in her upper extremities that would make it unsafe for her to simply use a cane. She is safely able to transfer from her wheelchair. They're sufficient room in her home for a wheelchair. Therefore I will give the patient an order for a wheelchair to significantly improve her quality of life and her ability to perform ADLs  06/21/17 The patient's weight has remained relatively stable. She is only lost 3 pounds since her last visit. Clinically she appears stable. She is pleasantly confused today. She is answering all the questions I ask. She is cutting up with me in the exam room and appears to have good energy and good spirits. Daughter is concerned because she is seeing black tarry stools and the patient experiences  fecal incontinence. Patient is on iron for anemia of chronic disease which may contribute to the black melena. I will see the concern would be an occult GI bleed. Patient denies any abdominal pain. She does report occasional epigastric nausea and discomfort but this is relatively infrequent and occurs every  other day. She has sporadic vomiting. She denies any indigestion. She is on an aspirin for cardiovascular protection and stroke protection given her history of an irregular heartbeat/tachybradycardia syndrome.   Past Medical History:  Diagnosis Date  . Arthritis   . Cardiomyopathy ([place])    a. ? ischemic vs non-ischemic;  b. 09/2013 Echo: EF 25-30%.  . Chronic systolic CHF (congestive heart failure) ([place])    a. 10/2013 Echo: EF 25-30%, diff HK, Gr 1 DD, triv AI, mild to mod MR, dev dil LA, mild RV dysfxn, mildly dil RA, mod TR.  . CKD (chronic kidney disease), stage III ([place])   . COPD (chronic obstructive pulmonary disease) ([place])   . Depression   . GERD (gastroesophageal reflux disease)   . Glaucoma of both eyes   . Gout   . Hypertension   . Kidney stones   . Obesity   . Tachy-brady syndrome ([place])    a. 03/2006 s/p SJM 5356 Verity ADx XLDR DC PPM (ser#: [id number]).   Past Surgical History:  Procedure Laterality Date  . CARDIAC CATHETERIZATION  10/2000   Archie Endo 10/10/2010  . CHOLECYSTECTOMY OPEN    . INSERT / REPLACE / REMOVE PACEMAKER  08/2000; 03/2006   Archie Endo 10/07/2010  . KIDNEY STONE SURGERY     "hospitalized for 19 days"  . TUBAL LIGATION     Current Outpatient Medications on File Prior to Visit  Medication Sig Dispense Refill  . albuterol (PROVENTIL) (2.5 MG/3ML) 0.083% nebulizer solution INHALE 1 VIAL VIA NEBULIZER EVERY 6 HOURS AS NEEDED FOR WHEEZING AND SHORTNESS OF BREATH 150 mL 4  . allopurinol (ZYLOPRIM) 100 MG tablet TAKE 1 TABLET BY MOUTH EVERY DAY 30 tablet 11  . amLODipine (NORVASC) 5 MG tablet TAKE ONE TABLET BY MOUTH ONCE DAILY. 90 tablet 3  . aspirin 81 MG tablet Take 1 tablet (81 mg total) by mouth daily. 30 tablet 11  . budesonide-formoterol (SYMBICORT) 160-4.5 MCG/ACT inhaler Inhale 2 puffs into the lungs 2 (two) times daily. 10.2 g 1  . carvedilol (COREG) 6.25 MG tablet TAKE 1 TABLET BY MOUTH TWICE DAILY WITH A MEAL 60 tablet 5  . citalopram (CELEXA) 10 MG  tablet TAKE 1 TABLET BY MOUTH DAILY 30 tablet 11  . DIGOX 125 MCG tablet TAKE ONE TABLET BY MOUTH DAILY 30 tablet 3  . ferrous sulfate 325 (65 FE) MG EC tablet Take 325 mg by mouth daily with breakfast.    . furosemide (LASIX) 20 MG tablet TAKE ONE TABLET BY MOUTH TWO TIMES DAILY. 60 tablet 3  . OXYGEN Inhale 2 L into the lungs continuous. Reported on 11/29/2015    . pantoprazole (PROTONIX) 40 MG tablet TAKE 1 TABLET BY MOUTH ONCE DAILY 30 tablet 11  . SYMBICORT 160-4.5 MCG/ACT inhaler INHALE 2 PUFFS BY MOUTH TWICE DAILY 10.2 g 11  . tiotropium (SPIRIVA HANDIHALER) 18 MCG inhalation capsule INHALE THE CONTENTS OF 1 CAPSULE VIA HANDIHALER BY MOUTH EVERY DAY 30 capsule 1   No current facility-administered medications on file prior to visit.    No Known Allergies Social History   Socioeconomic History  . Marital status: Widowed    Spouse name: Not on file  . Number of children:  Not on file  . Years of education: Not on file  . Highest education level: Not on file  Social Needs  . Financial resource strain: Not on file  . Food insecurity - worry: Not on file  . Food insecurity - inability: Not on file  . Transportation needs - medical: Not on file  . Transportation needs - non-medical: Not on file  Occupational History  . Not on file  Tobacco Use  . Smoking status: Former Smoker    Types: Cigarettes  . Smokeless tobacco: Never Used  . Tobacco comment: "never inhaled cigarettes; just lit them"  Substance and Sexual Activity  . Alcohol use: Yes    Comment: 07/23/2014 "I used to drink beer; nothing in 3-4 years"  . Drug use: No  . Sexual activity: No  Other Topics Concern  . Not on file  Social History Narrative  . Not on file      Review of Systems  All other systems reviewed and are negative.      Objective:   Physical Exam  Constitutional: She appears lethargic.  HENT:  Right Ear: Tympanic membrane and ear canal normal.  Left Ear: Tympanic membrane and ear canal  normal.  Nose: Nose normal. No mucosal edema or rhinorrhea.  Mouth/Throat: Oropharynx is clear and moist and mucous membranes are normal.  Cardiovascular: Normal rate, regular rhythm and normal heart sounds.  Pulmonary/Chest: Effort normal. No respiratory distress. She has no wheezes. She has no rhonchi. She has no rales.  Abdominal: Soft. Bowel sounds are normal. She exhibits no distension. There is no tenderness. There is no rebound and no guarding.  Musculoskeletal: She exhibits no edema.  Neurological: She has normal strength. She appears lethargic. She displays no tremor. No cranial nerve deficit or sensory deficit. She exhibits normal muscle tone.  Vitals reviewed.         Assessment & Plan:  Essential hypertension - Plan: CBC with Differential/Platelet, COMPLETE METABOLIC PANEL WITH GFR  Anemia in stage 4 chronic kidney disease ([place]) - Plan: CBC with Differential/Platelet, COMPLETE METABOLIC PANEL WITH GFR  Systolic congestive heart failure, NYHA class 3, unspecified congestive heart failure chronicity ([place]) - Plan: CBC with Differential/Platelet, COMPLETE METABOLIC PANEL WITH GFR It's possible that the black stool could be simply because of her iron tablet. Differential diagnosis includes blood loss from an upper GI source.  Patient is 84 years old. She will be a high-risk candidate for endoscopy. Furthermore if an abnormality were found, she will be a poor surgical candidate to correct the abnormality. I explained this in detail to both the patient and the daughter today. Daughter does not want any aggressive interventions undertaken. Otherwise the patient is asymptomatic and is doing very well from a subjective standpoint. Therefore I recommended checking a CBC. If she has worsening anemia suggesting ongoing blood loss, we will hold her aspirin and treat her empirically for possible ulcer with twice a day proton pump inhibitors. Otherwise, if her CBC is stable, we will continue the  aspirin as the benefit would outweigh the risk and increase her proton pump inhibitor to twice daily for possible peptic ulcer disease as a cause of melena particularly given her occasional stomach discomfort nausea and vomiting. We have decided as a group to forego any invasive testing without worsening symptoms which I agree with. Patient did receive the flu shot today

## 2017-06-21 NOTE — Addendum Note (Signed)
Addended by: Shary Decamp B on: 06/21/2017 12:23 PM   Modules accepted: Orders

## 2017-06-22 ENCOUNTER — Other Ambulatory Visit: Payer: Self-pay | Admitting: Family Medicine

## 2017-06-23 ENCOUNTER — Other Ambulatory Visit: Payer: Self-pay | Admitting: Family Medicine

## 2017-06-23 MED ORDER — PANTOPRAZOLE SODIUM 40 MG PO TBEC: 40.0000 mg | DELAYED_RELEASE_TABLET | Freq: Two times a day (BID) | ORAL | 11 refills | Status: DC

## 2017-06-28 DIAGNOSIS — M2550 Pain in unspecified joint: Secondary | ICD-10-CM | POA: Diagnosis not present

## 2017-06-28 DIAGNOSIS — I5022 Chronic systolic (congestive) heart failure: Secondary | ICD-10-CM | POA: Diagnosis not present

## 2017-06-28 DIAGNOSIS — M818 Other osteoporosis without current pathological fracture: Secondary | ICD-10-CM | POA: Diagnosis not present

## 2017-06-28 DIAGNOSIS — M6281 Muscle weakness (generalized): Secondary | ICD-10-CM | POA: Diagnosis not present

## 2017-06-28 DIAGNOSIS — J449 Chronic obstructive pulmonary disease, unspecified: Secondary | ICD-10-CM | POA: Diagnosis not present

## 2017-07-08 ENCOUNTER — Telehealth: Payer: Self-pay | Admitting: Family Medicine

## 2017-07-08 NOTE — Telephone Encounter (Signed)
Pt's granddaughter came by and picked up paperwork - She thinks that is why pt was calling - will leave note open for a few days in case she needed something else and returns my call.

## 2017-07-08 NOTE — Telephone Encounter (Signed)
Pt called and LMOVM to return her call - no other information provided. Then Leta Jungling called and left same message. Called placed and [facility].

## 2017-07-19 IMAGING — CR DG CHEST 2V
2 series · 2 of 2 positions shown · non-contrast
Comparison: 11/30/2015.  07/23/2014.  10/17/2013 .

CLINICAL DATA: Shortness of breath.

EXAM:
CHEST  2 VIEW

[w chest pa]
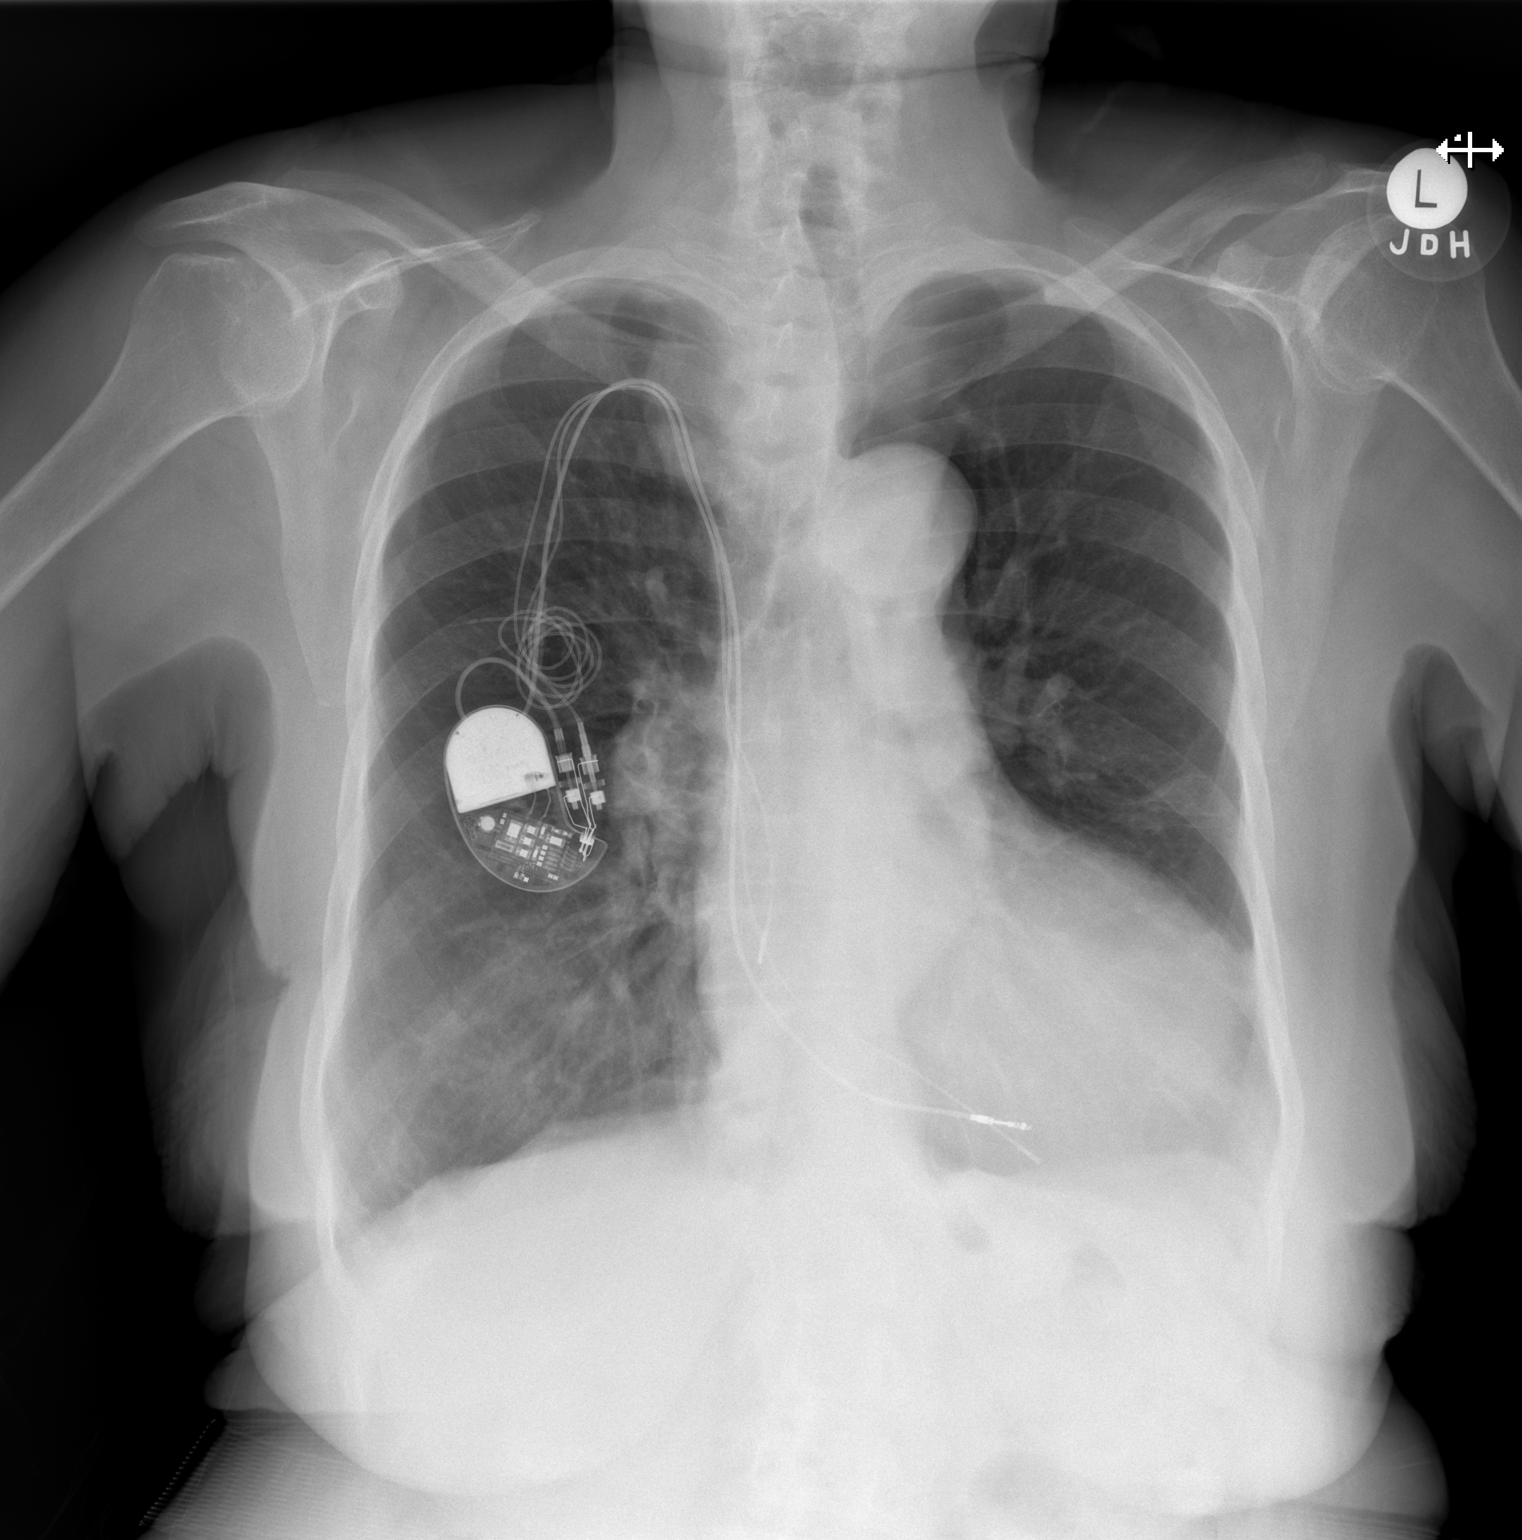

[w chest lat]
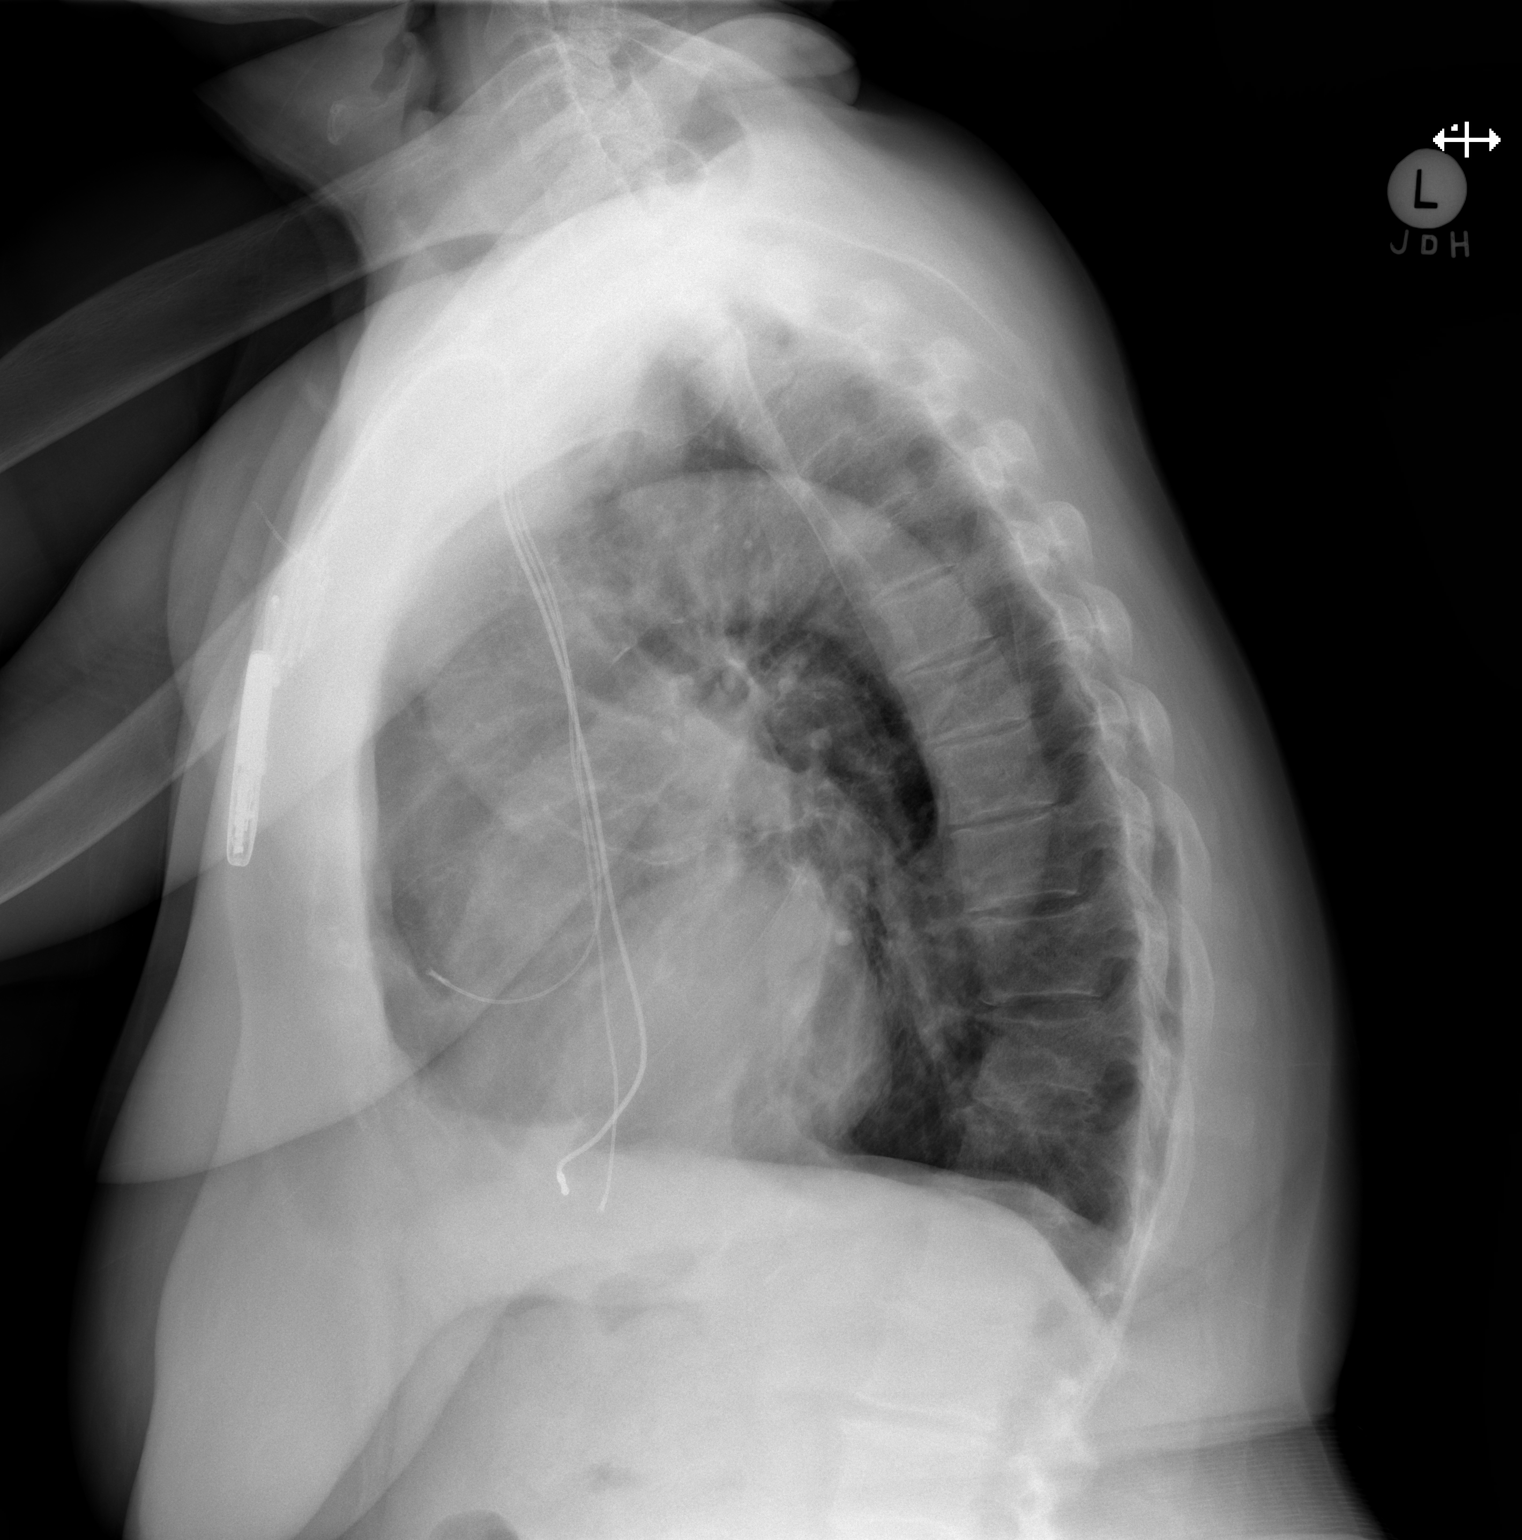

[2 of 2 positions shown; findings below may reference images not displayed]

FINDINGS: Cardiac pacer with lead tips in right atrium right ventricle.
Cardiomegaly with normal pulmonary vascularity. Basilar atelectasis
and/or scarring noted. No pleural effusion or pneumothorax.
IMPRESSION: 1. Cardiac pacer with lead tips in right atrium right ventricle.

2. Cardiomegaly with normal pulmonary vascularity.

3. Basilar atelectasis and/or scarring.

## 2017-07-26 DIAGNOSIS — M2550 Pain in unspecified joint: Secondary | ICD-10-CM | POA: Diagnosis not present

## 2017-07-26 DIAGNOSIS — I5022 Chronic systolic (congestive) heart failure: Secondary | ICD-10-CM | POA: Diagnosis not present

## 2017-08-02 DIAGNOSIS — I5022 Chronic systolic (congestive) heart failure: Secondary | ICD-10-CM | POA: Diagnosis not present

## 2017-08-02 DIAGNOSIS — J449 Chronic obstructive pulmonary disease, unspecified: Secondary | ICD-10-CM | POA: Diagnosis not present

## 2017-08-02 DIAGNOSIS — M818 Other osteoporosis without current pathological fracture: Secondary | ICD-10-CM | POA: Diagnosis not present

## 2017-08-02 DIAGNOSIS — M6281 Muscle weakness (generalized): Secondary | ICD-10-CM | POA: Diagnosis not present

## 2017-08-03 ENCOUNTER — Encounter: Payer: Medicare Other | Admitting: Internal Medicine

## 2017-08-17 ENCOUNTER — Encounter: Payer: Medicare Other | Admitting: Internal Medicine

## 2017-08-24 ENCOUNTER — Other Ambulatory Visit: Payer: Self-pay | Admitting: Family Medicine

## 2017-08-26 DIAGNOSIS — M2550 Pain in unspecified joint: Secondary | ICD-10-CM | POA: Diagnosis not present

## 2017-08-26 DIAGNOSIS — I5022 Chronic systolic (congestive) heart failure: Secondary | ICD-10-CM | POA: Diagnosis not present

## 2017-09-03 DIAGNOSIS — I5022 Chronic systolic (congestive) heart failure: Secondary | ICD-10-CM | POA: Diagnosis not present

## 2017-09-03 DIAGNOSIS — M6281 Muscle weakness (generalized): Secondary | ICD-10-CM | POA: Diagnosis not present

## 2017-09-03 DIAGNOSIS — J449 Chronic obstructive pulmonary disease, unspecified: Secondary | ICD-10-CM | POA: Diagnosis not present

## 2017-09-03 DIAGNOSIS — M818 Other osteoporosis without current pathological fracture: Secondary | ICD-10-CM | POA: Diagnosis not present

## 2017-09-21 ENCOUNTER — Ambulatory Visit (INDEPENDENT_AMBULATORY_CARE_PROVIDER_SITE_OTHER): Payer: Medicare Other | Admitting: Internal Medicine

## 2017-09-21 ENCOUNTER — Encounter: Payer: Self-pay | Admitting: Internal Medicine

## 2017-09-21 VITALS — BP 124/58 | HR 78 | Ht 64.0 in | Wt 180.5 lb

## 2017-09-21 DIAGNOSIS — I495 Sick sinus syndrome: Secondary | ICD-10-CM

## 2017-09-21 DIAGNOSIS — Z950 Presence of cardiac pacemaker: Secondary | ICD-10-CM | POA: Diagnosis not present

## 2017-09-21 NOTE — Patient Instructions (Signed)

## 2017-09-21 NOTE — Progress Notes (Signed)
Patient Care Team: [name], MD as PCP - General (Family Medicine)   HPI  [name] is a 84 y.o. female Seen in follow-up for pacemaker implanted 2002 for tachybradycardia syndrome. She underwent generator change 2007 with an RA lead revision.  She is oxygen-dependent COPD the setting of obesity hypoventilation  Recently hospitalized with COPD and CHF  She was advised to follow a 2 L fluid restriction and a low salt diet. This creats conflict in the house because she likes to drink Sprite  No edema or SOB   Ambulates with a walker   Echo EF 3/16 45-50%  Records and Results Reviewed   Past Medical History:  Diagnosis Date  . Arthritis   . Cardiomyopathy ([place])    a. ? ischemic vs non-ischemic;  b. 09/2013 Echo: EF 25-30%.  . Chronic systolic CHF (congestive heart failure) ([place])    a. 10/2013 Echo: EF 25-30%, diff HK, Gr 1 DD, triv AI, mild to mod MR, dev dil LA, mild RV dysfxn, mildly dil RA, mod TR.  . CKD (chronic kidney disease), stage III ([place])   . COPD (chronic obstructive pulmonary disease) ([place])   . Depression   . GERD (gastroesophageal reflux disease)   . Glaucoma of both eyes   . Gout   . Hypertension   . Kidney stones   . Obesity   . Tachy-brady syndrome ([place])    a. 03/2006 s/p SJM 5356 Verity ADx XLDR DC PPM (ser#: [id number]).    Past Surgical History:  Procedure Laterality Date  . CARDIAC CATHETERIZATION  10/2000   Archie Endo 10/10/2010  . CHOLECYSTECTOMY OPEN    . INSERT / REPLACE / REMOVE PACEMAKER  08/2000; 03/2006   Archie Endo 10/07/2010  . KIDNEY STONE SURGERY     "hospitalized for 19 days"  . TUBAL LIGATION      Current Outpatient Medications  Medication Sig Dispense Refill  . albuterol (PROVENTIL) (2.5 MG/3ML) 0.083% nebulizer solution INHALE 1 VIAL VIA NEBULIZER EVERY 6 HOURS AS NEEDED FOR WHEEZING AND SHORTNESS OF BREATH 150 mL 4  . allopurinol (ZYLOPRIM) 100 MG tablet TAKE 1 TABLET BY MOUTH EVERY DAY 30 tablet 11  . amLODipine  (NORVASC) 5 MG tablet TAKE ONE TABLET BY MOUTH ONCE DAILY. 90 tablet 3  . aspirin 81 MG tablet Take 1 tablet (81 mg total) by mouth daily. 30 tablet 11  . budesonide-formoterol (SYMBICORT) 160-4.5 MCG/ACT inhaler Inhale 2 puffs into the lungs 2 (two) times daily. 10.2 g 1  . carvedilol (COREG) 6.25 MG tablet TAKE 1 TABLET BY MOUTH TWICE DAILY WITH A MEAL 60 tablet 5  . citalopram (CELEXA) 10 MG tablet TAKE 1 TABLET BY MOUTH DAILY 30 tablet 11  . ferrous sulfate 325 (65 FE) MG EC tablet Take 325 mg by mouth daily with breakfast.    . furosemide (LASIX) 20 MG tablet TAKE ONE TABLET BY MOUTH TWO TIMES DAILY. 60 tablet 3  . OXYGEN Inhale 2 L into the lungs as needed. Reported on 11/29/2015    . pantoprazole (PROTONIX) 40 MG tablet Take 1 tablet (40 mg total) by mouth 2 (two) times daily. 60 tablet 11  . SPIRIVA HANDIHALER 18 MCG inhalation capsule INHALE THE CONTENTS OF 1 CAPSULE VIA HANDIHALER BY MOUTH EVERY DAY 30 capsule 3  . tiotropium (SPIRIVA HANDIHALER) 18 MCG inhalation capsule INHALE THE CONTENTS OF 1 CAPSULE VIA HANDIHALER BY MOUTH EVERY DAY 30 capsule 1   No current facility-administered medications for this visit.  No Known Allergies    Review of Systems negative except from HPI and PMH  Physical Exam BP (!) 124/58 (BP Location: Left Arm, Patient Position: Sitting, Cuff Size: Normal)   Pulse 78   Ht 5\' 4"  (1.626 m)   Wt 180 lb 8 oz (81.9 kg)   BMI 30.98 kg/m  Well developed and well nourished in no acute distress HENT normal E scleral and icterus clear Neck Supple JVP flat; carotids brisk and full Clear to ausculation  *Regular rate and rhythm, 2/6 M Soft with active bowel sounds No clubbing cyanosis  Edema Alert and oriented, grossly normal motor and sensory function sitting w Skin Warm and Dry  ECG sins @ 78 221/11/44 rsR pattern of RVH LAD  Assessment and  Plan  Pacemaker-St. Jude The patient's device was interrogated.  The information was reviewed. No  changes were made in the programming.     HFrEF/HFpEF  Hypertension  Euvolemic continue current meds  BP well controlled

## 2017-09-25 DIAGNOSIS — I5022 Chronic systolic (congestive) heart failure: Secondary | ICD-10-CM | POA: Diagnosis not present

## 2017-09-25 DIAGNOSIS — M2550 Pain in unspecified joint: Secondary | ICD-10-CM | POA: Diagnosis not present

## 2017-10-06 DIAGNOSIS — I5022 Chronic systolic (congestive) heart failure: Secondary | ICD-10-CM | POA: Diagnosis not present

## 2017-10-06 DIAGNOSIS — M818 Other osteoporosis without current pathological fracture: Secondary | ICD-10-CM | POA: Diagnosis not present

## 2017-10-06 DIAGNOSIS — J449 Chronic obstructive pulmonary disease, unspecified: Secondary | ICD-10-CM | POA: Diagnosis not present

## 2017-10-06 DIAGNOSIS — M6281 Muscle weakness (generalized): Secondary | ICD-10-CM | POA: Diagnosis not present

## 2017-10-26 ENCOUNTER — Other Ambulatory Visit: Payer: Self-pay | Admitting: Family Medicine

## 2017-10-26 DIAGNOSIS — I5022 Chronic systolic (congestive) heart failure: Secondary | ICD-10-CM | POA: Diagnosis not present

## 2017-10-26 DIAGNOSIS — M2550 Pain in unspecified joint: Secondary | ICD-10-CM | POA: Diagnosis not present

## 2017-11-05 DIAGNOSIS — M6281 Muscle weakness (generalized): Secondary | ICD-10-CM | POA: Diagnosis not present

## 2017-11-05 DIAGNOSIS — J449 Chronic obstructive pulmonary disease, unspecified: Secondary | ICD-10-CM | POA: Diagnosis not present

## 2017-11-05 DIAGNOSIS — M818 Other osteoporosis without current pathological fracture: Secondary | ICD-10-CM | POA: Diagnosis not present

## 2017-11-05 DIAGNOSIS — I5022 Chronic systolic (congestive) heart failure: Secondary | ICD-10-CM | POA: Diagnosis not present

## 2017-11-22 ENCOUNTER — Other Ambulatory Visit: Payer: Self-pay | Admitting: Family Medicine

## 2017-11-24 ENCOUNTER — Other Ambulatory Visit: Payer: Self-pay

## 2017-11-24 ENCOUNTER — Emergency Department (HOSPITAL_COMMUNITY)
Admission: EM | Admit: 2017-11-24 | Discharge: 2017-11-24 | Disposition: A | Discharge status: Home / Self Care | Payer: Medicare Other | Attending: Emergency Medicine | Admitting: Emergency Medicine

## 2017-11-24 ENCOUNTER — Encounter (HOSPITAL_COMMUNITY): Payer: Self-pay | Admitting: Emergency Medicine

## 2017-11-24 ENCOUNTER — Emergency Department (HOSPITAL_COMMUNITY): Payer: Medicare Other

## 2017-11-24 DIAGNOSIS — R079 Chest pain, unspecified: Secondary | ICD-10-CM | POA: Insufficient documentation

## 2017-11-24 DIAGNOSIS — I5042 Chronic combined systolic (congestive) and diastolic (congestive) heart failure: Secondary | ICD-10-CM | POA: Insufficient documentation

## 2017-11-24 DIAGNOSIS — Z79899 Other long term (current) drug therapy: Secondary | ICD-10-CM | POA: Insufficient documentation

## 2017-11-24 DIAGNOSIS — I440 Atrioventricular block, first degree: Secondary | ICD-10-CM | POA: Diagnosis not present

## 2017-11-24 DIAGNOSIS — J449 Chronic obstructive pulmonary disease, unspecified: Secondary | ICD-10-CM | POA: Insufficient documentation

## 2017-11-24 DIAGNOSIS — N184 Chronic kidney disease, stage 4 (severe): Secondary | ICD-10-CM | POA: Insufficient documentation

## 2017-11-24 DIAGNOSIS — Z87891 Personal history of nicotine dependence: Secondary | ICD-10-CM | POA: Diagnosis not present

## 2017-11-24 DIAGNOSIS — I130 Hypertensive heart and chronic kidney disease with heart failure and stage 1 through stage 4 chronic kidney disease, or unspecified chronic kidney disease: Secondary | ICD-10-CM | POA: Insufficient documentation

## 2017-11-24 DIAGNOSIS — R0789 Other chest pain: Secondary | ICD-10-CM | POA: Diagnosis not present

## 2017-11-24 DIAGNOSIS — R0602 Shortness of breath: Secondary | ICD-10-CM

## 2017-11-24 DIAGNOSIS — Z7982 Long term (current) use of aspirin: Secondary | ICD-10-CM | POA: Diagnosis not present

## 2017-11-24 LAB — BASIC METABOLIC PANEL
ANION GAP: 6 (ref 5–15)
BUN: 47 mg/dL — AB (ref 8–23)
CALCIUM: 8.7 mg/dL — AB (ref 8.9–10.3)
CO2: 28 mmol/L (ref 22–32)
Chloride: 104 mmol/L (ref 98–111)
Creatinine, Ser: 2.18 mg/dL — ABNORMAL HIGH (ref 0.44–1.00)
GFR calc Af Amer: 21 mL/min — ABNORMAL LOW (ref >60)
GFR, EST NON AFRICAN AMERICAN: 18 mL/min — AB (ref >60)
GLUCOSE: 76 mg/dL (ref 70–99)
Potassium: 4.9 mmol/L (ref 3.5–5.1)
Sodium: 138 mmol/L (ref 135–145)

## 2017-11-24 LAB — CBC
HCT: 34.3 % — ABNORMAL LOW (ref 36.0–46.0)
HEMOGLOBIN: 10.3 g/dL — AB (ref 12.0–15.0)
MCH: 29.5 pg (ref 26.0–34.0)
MCHC: 30 g/dL (ref 30.0–36.0)
MCV: 98.3 fL (ref 78.0–100.0)
Platelets: 225 10*3/uL (ref 150–400)
RBC: 3.49 MIL/uL — ABNORMAL LOW (ref 3.87–5.11)
RDW: 14.2 % (ref 11.5–15.5)
WBC: 8.7 10*3/uL (ref 4.0–10.5)

## 2017-11-24 LAB — I-STAT TROPONIN, ED
TROPONIN I, POC: 0.04 ng/mL (ref 0.00–0.08)
Troponin i, poc: 0.06 ng/mL (ref 0.00–0.08)

## 2017-11-24 LAB — BRAIN NATRIURETIC PEPTIDE: B NATRIURETIC PEPTIDE 5: 354.5 pg/mL — AB (ref 0.0–100.0)

## 2017-11-24 MED ORDER — FUROSEMIDE 10 MG/ML IJ SOLN
40.0000 mg | Freq: Once | INTRAMUSCULAR | Status: AC
  Administered 2017-11-24: 40 mg via INTRAVENOUS
  Filled 2017-11-24: qty 4

## 2017-11-24 NOTE — ED Notes (Signed)
ED Provider at bedside. 

## 2017-11-24 NOTE — ED Provider Notes (Signed)
9:35 AM-case checkout from Dr. Dina Rich to evaluate post pacemaker interrogation.  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  Formal interrogation has been completed.  I discussed the case with [name], the pacemaker rep from [facility].  Patient has had 17 brief episodes of atrial fibrillation, unable to characterize the duration, because of the device functionality.  Pacemaker was interrogated to assess for possible arrhythmias leading to cardiovascular malfunction and shortness of breath.  Patient does not have signs and symptoms or clinical evaluation consistent with fluid overload.  She was given a dose of Lasix in the ED.  She is on home Lasix.  At this time patient alert, cooperative and has reassuring vital signs.  There is no indication for further evaluation or intervention, from the ED setting.  Recommend PCP follow-up 1 week and as needed.   Daleen Bo, MD 11/24/17 [phone]

## 2017-11-24 NOTE — ED Notes (Signed)
Patient transported to X-ray 

## 2017-11-24 NOTE — ED Notes (Signed)
Pt's family came and informed RN that pt was outside earlier and when she came in she was having trouble breathing.  Pt states she does not feel SOB at this time.

## 2017-11-24 NOTE — Discharge Instructions (Addendum)
Take all of your medications as directed.  Follow-up with your primary care doctor for a checkup.

## 2017-11-24 NOTE — ED Notes (Signed)
[employer] rep in room at this time.

## 2017-11-24 NOTE — ED Provider Notes (Signed)
[facility] EMERGENCY DEPARTMENT Provider Note   CSN: [id number] Arrival date & time: 11/24/17  0251     History   Chief Complaint Chief Complaint  Patient presents with  . Chest Pain    HPI [name] is a 84 y.o. female.  HPI  This is a 84 year old female with a history of cardiomyopathy, systolic heart failure, COPD, hypertension, hyperlipidemia, tachybradycardia syndrome status post pacemaker who presents with shortness of breath.  She initially told me that she was here "just to get me some sleep."  However, she states over the last 24 hours she has had increasing shortness of breath.  She denies any chest pain.  She denies any recent cough or fever.  Denies any lower extremity swelling.  She denies any recent medication changes.  Denies palpitations.  Denies domino pain, nausea, vomiting, diarrhea.  Past Medical History:  Diagnosis Date  . Arthritis   . Cardiomyopathy ([place])    a. ? ischemic vs non-ischemic;  b. 09/2013 Echo: EF 25-30%.  . Chronic systolic CHF (congestive heart failure) ([place])    a. 10/2013 Echo: EF 25-30%, diff HK, Gr 1 DD, triv AI, mild to mod MR, dev dil LA, mild RV dysfxn, mildly dil RA, mod TR.  . CKD (chronic kidney disease), stage III ([place])   . COPD (chronic obstructive pulmonary disease) ([place])   . Depression   . GERD (gastroesophageal reflux disease)   . Glaucoma of both eyes   . Gout   . Hypertension   . Kidney stones   . Obesity   . Tachy-brady syndrome ([place])    a. 03/2006 s/p SJM 5356 Verity ADx XLDR DC PPM (ser#: [id number]).    Patient Active Problem List   Diagnosis Date Noted  . Acute renal failure superimposed on stage 4 chronic kidney disease ([place])   . COPD exacerbation ([place]) 11/30/2015  . Normocytic anemia 11/30/2015  . Elevated troponin 11/30/2015  . Chronic combined systolic and diastolic CHF (congestive heart failure) ([place]) 11/30/2015  . Acute on chronic systolic heart failure ([place]) 07/24/2014  . SOB  (shortness of breath) 07/23/2014  . COPD (chronic obstructive pulmonary disease) ([place])   . Chronic systolic CHF (congestive heart failure) ([place])   . CKD (chronic kidney disease), stage IV ([place])   . CHF NYHA class III (symptoms with mildly strenuous activities) ([place])   . Cardiomyopathy ([place]) 12/27/2013  . GERD (gastroesophageal reflux disease) 07/26/2013  . Onychogryphosis 04/14/2013  . Pain, foot 04/14/2013  . Nail problem 03/30/2013  . Peripheral edema 03/28/2013  . Arthritis 09/30/2012  . Chronic kidney disease   . Gout   . Pacemaker-St.Jude 03/17/2012  . Dyspnea 01/29/2012  . SVT (supraventricular tachycardia) ([place]) 01/29/2012  . Essential hypertension   . Obesity   . Depression   . Tachy-brady syndrome [facility])     Past Surgical History:  Procedure Laterality Date  . CARDIAC CATHETERIZATION  10/2000   Archie Endo 10/10/2010  . CHOLECYSTECTOMY OPEN    . INSERT / REPLACE / REMOVE PACEMAKER  08/2000; 03/2006   Archie Endo 10/07/2010  . KIDNEY STONE SURGERY     "hospitalized for 19 days"  . TUBAL LIGATION       OB History   None      Home Medications    Prior to Admission medications   Medication Sig Start Date End Date Taking? Authorizing Provider  albuterol (PROVENTIL) (2.5 MG/3ML) 0.083% nebulizer solution INHALE 1 VIAL VIA NEBULIZER EVERY 6 HOURS AS NEEDED FOR WHEEZING AND SHORTNESS  OF BREATH 05/26/17   [name], MD  allopurinol (ZYLOPRIM) 100 MG tablet TAKE 1 TABLET BY MOUTH EVERY DAY 11/23/17   [name], MD  amLODipine (NORVASC) 5 MG tablet TAKE ONE TABLET BY MOUTH ONCE DAILY. 10/27/17   [name], MD  aspirin 81 MG tablet Take 1 tablet (81 mg total) by mouth daily. 09/28/16   [name], MD  budesonide-formoterol (SYMBICORT) 160-4.5 MCG/ACT inhaler Inhale 2 puffs into the lungs 2 (two) times daily. 09/28/16   [name], MD  carvedilol (COREG) 6.25 MG tablet TAKE 1 TABLET BY MOUTH TWICE DAILY WITH A MEAL 05/26/17   [name], MD  citalopram  (CELEXA) 10 MG tablet TAKE 1 TABLET BY MOUTH DAILY 04/29/17   [name], MD  ferrous sulfate 325 (65 FE) MG EC tablet Take 325 mg by mouth daily with breakfast.    [provider]  furosemide (LASIX) 20 MG tablet TAKE ONE TABLET BY MOUTH TWO TIMES DAILY. 11/23/17   [name], MD  OXYGEN Inhale 2 L into the lungs as needed. Reported on 11/29/2015    [provider]  pantoprazole (PROTONIX) 40 MG tablet Take 1 tablet (40 mg total) by mouth 2 (two) times daily. 06/23/17   [name], MD  SPIRIVA HANDIHALER 18 MCG inhalation capsule INHALE THE CONTENTS OF 1 CAPSULE VIA HANDIHALER BY MOUTH EVERY DAY 08/25/17   [name], MD  SYMBICORT 160-4.5 MCG/ACT inhaler INHALE 2 PUFFS BY MOUTH TWICE DAILY 11/23/17   [name], MD  tiotropium (SPIRIVA HANDIHALER) 18 MCG inhalation capsule INHALE THE CONTENTS OF 1 CAPSULE VIA HANDIHALER BY MOUTH EVERY DAY 09/28/16   [name], MD    Family History Family History  Problem Relation Age of Onset  . Hypertension Mother   . Heart attack Neg Hx   . Stroke Neg Hx     Social History Social History   Tobacco Use  . Smoking status: Former Smoker    Types: Cigarettes  . Smokeless tobacco: Never Used  . Tobacco comment: "never inhaled cigarettes; just lit them"  Substance Use Topics  . Alcohol use: Yes    Comment: 07/23/2014 "I used to drink beer; nothing in 3-4 years"  . Drug use: No     Allergies   Patient has no known allergies.   Review of Systems Review of Systems  Constitutional: Negative for fever.  Respiratory: Positive for shortness of breath. Negative for cough.   Cardiovascular: Negative for chest pain and leg swelling.  Gastrointestinal: Negative for abdominal pain, nausea and vomiting.  Genitourinary: Negative for dysuria.  Neurological: Negative for weakness.  All other systems reviewed and are negative.    Physical Exam Updated Vital Signs BP 138/80   Pulse 74   Temp 97.7 F  (36.5 C) (Oral)   Resp 14   SpO2 96%   Physical Exam  Constitutional: She is oriented to person, place, and time.  Elderly, nontoxic-appearing, no acute distress, occasionally falls asleep but is easily arousable and oriented and able to provide history  HENT:  Head: Normocephalic and atraumatic.  Eyes: Pupils are equal, round, and reactive to light.  Neck: Neck supple.  Cardiovascular: Normal rate, regular rhythm, normal heart sounds and normal pulses.  Pacemaker palpated  Pulmonary/Chest: Effort normal. No respiratory distress. She has no wheezes.  Abdominal: Soft. Bowel sounds are normal.  Musculoskeletal:       Right lower leg: She exhibits edema.  Left lower leg: She exhibits edema.  Trace bilateral lower extremity edema  Neurological: She is alert and oriented to person, place, and time.  Skin: Skin is warm and dry.  Psychiatric: She has a normal mood and affect.  Nursing note and vitals reviewed.    ED Treatments / Results  Labs (all labs ordered are listed, but only abnormal results are displayed) Labs Reviewed  BASIC METABOLIC PANEL - Abnormal; Notable for the following components:      Result Value   BUN 47 (*)    Creatinine, Ser 2.18 (*)    Calcium 8.7 (*)    GFR calc non Af Amer 18 (*)    GFR calc Af Amer 21 (*)    All other components within normal limits  CBC - Abnormal; Notable for the following components:   RBC 3.49 (*)    Hemoglobin 10.3 (*)    HCT 34.3 (*)    All other components within normal limits  BRAIN NATRIURETIC PEPTIDE - Abnormal; Notable for the following components:   B Natriuretic Peptide 354.5 (*)    All other components within normal limits  I-STAT TROPONIN, ED  I-STAT TROPONIN, ED    EKG EKG Interpretation  Date/Time:  Wednesday November 24 2017 03:00:45 EDT Ventricular Rate:  71 PR Interval:  216 QRS Duration: 108 QT Interval:  440 QTC Calculation: 478 R Axis:   -60 Text Interpretation:  Sinus rhythm with 1st degree A-V  block Left axis deviation RSR' or QR pattern in V1 suggests right ventricular conduction delay Left ventricular hypertrophy with repolarization abnormality Inferior infarct , age undetermined Anterolateral infarct , age undetermined Abnormal ECG No significant change since last tracing Confirmed by Thayer Jew [phone]) on 11/24/2017 3:35:37 AM Also confirmed by Thayer Jew [phone]), editor Philomena Doheny [phone])  on 11/24/2017 7:20:00 AM   Radiology Dg Chest 2 View  Result Date: 11/24/2017 CLINICAL DATA:  Chest pain EXAM: CHEST - 2 VIEW COMPARISON:  11/10/2016 FINDINGS: Right-sided pacing device as before. Mild cardiomegaly. Hyperinflation. Scarring within the left mid lung. Aortic atherosclerosis. No pneumothorax. IMPRESSION: Hyperinflated lungs without focal opacity. Cardiomegaly. Scarring in the left lower lung. Electronically Signed   By: Donavan Foil M.D.   On: 11/24/2017 03:47    Procedures Procedures (including critical care time)  Medications Ordered in ED Medications  furosemide (LASIX) injection 40 mg (40 mg Intravenous Given 11/24/17 0549)     Initial Impression / Assessment and Plan / ED Course  I have reviewed the triage vital signs and the nursing notes.  Pertinent labs & imaging results that were available during my care of the patient were reviewed by me and considered in my medical decision making (see chart for details).  Clinical Course as of Nov 24 721  Wed Nov 24, 2017  4765 Patient's work-up is largely reassuring.  Initial troponin is negative followed by negative delta troponin.  BNP is minimally elevated.  No signs of overt volume overload.  Creatinine is at baseline.  She does have some trace lower extremity edema.  Patient was given 1 dose of IV Lasix to cover for any possible pulmonary edema.  Awaiting pacemaker interrogation.  If this is reassuring, feel patient can be discharged home with cardiology follow-up.   [CH]    Clinical Course User Index [CH]  Horton, Barbette Hair, MD    She presents with shortness of breath.  Denies any chest pain to me.  She is overall nontoxic-appearing on exam and vital signs are reassuring.  She is in a paced rhythm on her EKG with no signs of acute ischemia.  Chest x-ray suggests no evidence of overt volume overload.  BNP is minimally elevated as above.  Troponin x2-.  Awaiting pacemaker interrogation.  On recheck, patient remains clinically stable with normal O2 sats.  She ambulates with pulse ox normal and pacemaker interrogation is reassuring, feel she is safe for discharge home and close cardiology follow-up.  Patient signed out to oncoming provider.  Final Clinical Impressions(s) / ED Diagnoses   Final diagnoses:  None    ED Discharge Orders    None       Horton, Barbette Hair, MD 11/24/17 [phone]

## 2017-11-24 NOTE — ED Triage Notes (Addendum)
Pt initially stated she has chest pain but then stated "I just don't feel good,.. I better get checked out."  Pt fell asleep in triage several times however is able to wake up quickly and is alert.  Negative NIH.

## 2017-11-24 NOTE — ED Notes (Signed)
Pt denies having chest pain states she came with c/o  shortness of breath.

## 2017-11-25 DIAGNOSIS — I5022 Chronic systolic (congestive) heart failure: Secondary | ICD-10-CM | POA: Diagnosis not present

## 2017-11-25 DIAGNOSIS — M2550 Pain in unspecified joint: Secondary | ICD-10-CM | POA: Diagnosis not present

## 2017-12-06 ENCOUNTER — Encounter: Payer: Self-pay | Admitting: Family Medicine

## 2017-12-06 ENCOUNTER — Ambulatory Visit (INDEPENDENT_AMBULATORY_CARE_PROVIDER_SITE_OTHER): Payer: Medicare Other | Admitting: Family Medicine

## 2017-12-06 VITALS — BP 128/60 | HR 74 | Temp 98.2°F | Resp 22 | Ht 61.5 in | Wt 183.0 lb

## 2017-12-06 DIAGNOSIS — R0600 Dyspnea, unspecified: Secondary | ICD-10-CM

## 2017-12-06 MED ORDER — ALPRAZOLAM 0.25 MG PO TABS: 0.2500 mg | ORAL_TABLET | Freq: Two times a day (BID) | ORAL | 0 refills | Status: AC | PRN

## 2017-12-06 NOTE — Progress Notes (Signed)
Subjective:    Patient ID: [name], female    DOB: Oct 11, 1922, 84 y.o.   MRN: [id number]  Patient is here today with her daughter.  She recently went to the emergency room for dyspnea.  In the emergency room, chest x-ray revealed hyperventilation and cardiomegaly.  BNP was slightly elevated greater than 300.  Remainder of her lab work was unremarkable.  Pacemaker was interrogated and found to be functioning properly.  Patient states that she gets short of breath in the evenings primarily.  During the daytime she is doing well.  Daughter states that she believes this is due to her living situation.  She is living with her granddaughter.  During the day, granddaughter and her husband/partner are at work in the home is quiet.  At night, there is a tremendous amount of stress in the household.  They are constantly fighting.  Even the patient states at some point she believes 1 of them is going to get her or hurt each other.  Daughter believes that this is causing the patient to have a tremendous anxiety and that the anxiety is contributing to her dyspnea.  Patient denies any chest pain.  She is unable to clarify any further what she means by shortness of breath.  There is no pitting edema today on exam.  Lungs are clear to auscultation.  Pulse oximetry is low at 91%.  Past Medical History:  Diagnosis Date  . Arthritis   . Cardiomyopathy ([place])    a. ? ischemic vs non-ischemic;  b. 09/2013 Echo: EF 25-30%.  . Chronic systolic CHF (congestive heart failure) ([place])    a. 10/2013 Echo: EF 25-30%, diff HK, Gr 1 DD, triv AI, mild to mod MR, dev dil LA, mild RV dysfxn, mildly dil RA, mod TR.  . CKD (chronic kidney disease), stage III ([place])   . COPD (chronic obstructive pulmonary disease) ([place])   . Depression   . GERD (gastroesophageal reflux disease)   . Glaucoma of both eyes   . Gout   . Hypertension   . Kidney stones   . Obesity   . Tachy-brady syndrome ([place])    a. 03/2006 s/p SJM 5356 Verity ADx  XLDR DC PPM (ser#: [id number]).   Past Surgical History:  Procedure Laterality Date  . CARDIAC CATHETERIZATION  10/2000   Archie Endo 10/10/2010  . CHOLECYSTECTOMY OPEN    . INSERT / REPLACE / REMOVE PACEMAKER  08/2000; 03/2006   Archie Endo 10/07/2010  . KIDNEY STONE SURGERY     "hospitalized for 19 days"  . TUBAL LIGATION     Current Outpatient Medications on File Prior to Visit  Medication Sig Dispense Refill  . albuterol (PROVENTIL) (2.5 MG/3ML) 0.083% nebulizer solution INHALE 1 VIAL VIA NEBULIZER EVERY 6 HOURS AS NEEDED FOR WHEEZING AND SHORTNESS OF BREATH 150 mL 4  . allopurinol (ZYLOPRIM) 100 MG tablet TAKE 1 TABLET BY MOUTH EVERY DAY 30 tablet 11  . amLODipine (NORVASC) 5 MG tablet TAKE ONE TABLET BY MOUTH ONCE DAILY. 90 tablet 3  . aspirin 81 MG tablet Take 1 tablet (81 mg total) by mouth daily. 30 tablet 11  . budesonide-formoterol (SYMBICORT) 160-4.5 MCG/ACT inhaler Inhale 2 puffs into the lungs 2 (two) times daily. 10.2 g 1  . carvedilol (COREG) 6.25 MG tablet TAKE 1 TABLET BY MOUTH TWICE DAILY WITH A MEAL 60 tablet 5  . citalopram (CELEXA) 10 MG tablet TAKE 1 TABLET BY MOUTH DAILY 30 tablet 11  . ferrous sulfate 325 (65 FE)  MG EC tablet Take 325 mg by mouth daily with breakfast.    . furosemide (LASIX) 20 MG tablet TAKE ONE TABLET BY MOUTH TWO TIMES DAILY. 60 tablet 3  . OXYGEN Inhale 2 L into the lungs as needed. Reported on 11/29/2015    . pantoprazole (PROTONIX) 40 MG tablet Take 1 tablet (40 mg total) by mouth 2 (two) times daily. 60 tablet 11  . SPIRIVA HANDIHALER 18 MCG inhalation capsule INHALE THE CONTENTS OF 1 CAPSULE VIA HANDIHALER BY MOUTH EVERY DAY 30 capsule 3  . SYMBICORT 160-4.5 MCG/ACT inhaler INHALE 2 PUFFS BY MOUTH TWICE DAILY 10.2 Inhaler 10  . tiotropium (SPIRIVA HANDIHALER) 18 MCG inhalation capsule INHALE THE CONTENTS OF 1 CAPSULE VIA HANDIHALER BY MOUTH EVERY DAY 30 capsule 1   No current facility-administered medications on file prior to visit.    No Known  Allergies Social History   Socioeconomic History  . Marital status: Widowed    Spouse name: Not on file  . Number of children: Not on file  . Years of education: Not on file  . Highest education level: Not on file  Occupational History  . Not on file  Social Needs  . Financial resource strain: Not on file  . Food insecurity:    Worry: Not on file    Inability: Not on file  . Transportation needs:    Medical: Not on file    Non-medical: Not on file  Tobacco Use  . Smoking status: Former Smoker    Types: Cigarettes  . Smokeless tobacco: Never Used  . Tobacco comment: "never inhaled cigarettes; just lit them"  Substance and Sexual Activity  . Alcohol use: Yes    Comment: 07/23/2014 "I used to drink beer; nothing in 3-4 years"  . Drug use: No  . Sexual activity: Never  Lifestyle  . Physical activity:    Days per week: Not on file    Minutes per session: Not on file  . Stress: Not on file  Relationships  . Social connections:    Talks on phone: Not on file    Gets together: Not on file    Attends religious service: Not on file    Active member of club or organization: Not on file    Attends meetings of clubs or organizations: Not on file    Relationship status: Not on file  . Intimate partner violence:    Fear of current or ex partner: Not on file    Emotionally abused: Not on file    Physically abused: Not on file    Forced sexual activity: Not on file  Other Topics Concern  . Not on file  Social History Narrative  . Not on file      Review of Systems  All other systems reviewed and are negative.      Objective:   Physical Exam  Constitutional: Vital signs are normal. She appears well-developed and well-nourished.  HENT:  Right Ear: Tympanic membrane and ear canal normal.  Left Ear: Tympanic membrane and ear canal normal.  Nose: Nose normal. No mucosal edema or rhinorrhea.  Mouth/Throat: Oropharynx is clear and moist and mucous membranes are normal.   Cardiovascular: Normal rate, regular rhythm and normal heart sounds.  Pulmonary/Chest: Effort normal. No respiratory distress. She has no wheezes. She has no rhonchi. She has no rales.  Abdominal: Soft. Bowel sounds are normal. She exhibits no distension. There is no tenderness. There is no rebound and no guarding.  Musculoskeletal: She exhibits  no edema.  Neurological: She is alert. She has normal strength. She displays no tremor. No cranial nerve deficit or sensory deficit. She exhibits normal muscle tone.  Vitals reviewed.         Assessment & Plan:  Dyspnea, unspecified type Symptoms are likely multifactorial and due in large part to her advanced age, her neovascular issues.  She had mild anemia on her lab work at the hospital with a hemoglobin of 10.  I am sure this plays a role.  Her BNP was elevated slightly at over 300.  She also has some mild wheezing at times and has a documented history of COPD with mildly depressed oxygen saturations on room air.  However I agree with her daughter and I believe the majority of the symptoms sound anxiety related just based on the patient's clinical appearance and her symptoms.  We will try the patient on extremely low-dose benzodiazepine Xanax 0.25 mg p.o. twice daily for the next week and then recheck in 1 week to see if her symptoms have improved.  If not at that time, I will start the patient on a long-acting bronchodilator for possible underlying COPD.  Unfortunately I do not feel that there is anything else we can offer from a cardiovascular standpoint given her advanced age.

## 2017-12-17 ENCOUNTER — Other Ambulatory Visit: Payer: Self-pay | Admitting: Family Medicine

## 2017-12-26 DIAGNOSIS — I5022 Chronic systolic (congestive) heart failure: Secondary | ICD-10-CM | POA: Diagnosis not present

## 2017-12-26 DIAGNOSIS — M2550 Pain in unspecified joint: Secondary | ICD-10-CM | POA: Diagnosis not present

## 2017-12-27 DIAGNOSIS — J449 Chronic obstructive pulmonary disease, unspecified: Secondary | ICD-10-CM | POA: Diagnosis not present

## 2017-12-27 DIAGNOSIS — M818 Other osteoporosis without current pathological fracture: Secondary | ICD-10-CM | POA: Diagnosis not present

## 2017-12-27 DIAGNOSIS — I5022 Chronic systolic (congestive) heart failure: Secondary | ICD-10-CM | POA: Diagnosis not present

## 2017-12-27 DIAGNOSIS — M6281 Muscle weakness (generalized): Secondary | ICD-10-CM | POA: Diagnosis not present

## 2018-01-17 ENCOUNTER — Ambulatory Visit (INDEPENDENT_AMBULATORY_CARE_PROVIDER_SITE_OTHER): Payer: Medicare Other | Admitting: Podiatry

## 2018-01-17 ENCOUNTER — Encounter: Payer: Self-pay | Admitting: Podiatry

## 2018-01-17 DIAGNOSIS — M79676 Pain in unspecified toe(s): Secondary | ICD-10-CM | POA: Diagnosis not present

## 2018-01-17 DIAGNOSIS — B351 Tinea unguium: Secondary | ICD-10-CM

## 2018-01-17 DIAGNOSIS — Q828 Other specified congenital malformations of skin: Secondary | ICD-10-CM | POA: Diagnosis not present

## 2018-01-17 NOTE — Progress Notes (Signed)
Complaint:  Visit Type: Patient returns to my office for continued preventative foot care services. Complaint: Patient states" my nails have grown long and thick and become painful to walk and wear shoes" Patient has not been seen in over one year.  She is accompanied by her daughter.  Her daughter says she has difficulty placing her feet on the floor due to pain. The patient presents for preventative foot care services. No changes to ROS  Podiatric Exam: Vascular: dorsalis pedis and posterior tibial pulses are palpable bilateral. Capillary return is immediate. Temperature gradient is WNL. Skin turgor WNL  Sensorium: Normal Semmes Weinstein monofilament test. Normal tactile sensation bilaterally. Nail Exam: Pt has thick disfigured discolored nails with subungual debris noted bilateral entire nail hallux through fifth toenails Ulcer Exam: There is no evidence of ulcer or pre-ulcerative changes or infection. Orthopedic Exam: Muscle tone and strength are WNL. No limitations in general ROM. No crepitus or effusions noted. Foot type and digits show no abnormalities. HAV with contracted digits  B/L Skin:  Porokeratosis.  Sub 1  B/L No infection or ulcers  Diagnosis:  Onychomycosis, , Pain in right toe, pain in left toes  Treatment & Plan Procedures and Treatment: Consent by patient was obtained for treatment procedures.   Debridement of mycotic and hypertrophic toenails, 1 through 5 bilateral and clearing of subungual debris. No ulceration, no infection noted.  Return Visit-Office Procedure: Patient instructed to return to the office for a follow up visit 3 months for continued evaluation and treatment.    Gardiner Barefoot DPM

## 2018-01-26 DIAGNOSIS — I5022 Chronic systolic (congestive) heart failure: Secondary | ICD-10-CM | POA: Diagnosis not present

## 2018-01-26 DIAGNOSIS — M2550 Pain in unspecified joint: Secondary | ICD-10-CM | POA: Diagnosis not present

## 2018-02-01 DIAGNOSIS — M6281 Muscle weakness (generalized): Secondary | ICD-10-CM | POA: Diagnosis not present

## 2018-02-01 DIAGNOSIS — I5022 Chronic systolic (congestive) heart failure: Secondary | ICD-10-CM | POA: Diagnosis not present

## 2018-02-01 DIAGNOSIS — M818 Other osteoporosis without current pathological fracture: Secondary | ICD-10-CM | POA: Diagnosis not present

## 2018-02-01 DIAGNOSIS — J449 Chronic obstructive pulmonary disease, unspecified: Secondary | ICD-10-CM | POA: Diagnosis not present

## 2018-02-25 DIAGNOSIS — I5022 Chronic systolic (congestive) heart failure: Secondary | ICD-10-CM | POA: Diagnosis not present

## 2018-02-25 DIAGNOSIS — M2550 Pain in unspecified joint: Secondary | ICD-10-CM | POA: Diagnosis not present

## 2018-02-26 DIAGNOSIS — I5022 Chronic systolic (congestive) heart failure: Secondary | ICD-10-CM | POA: Diagnosis not present

## 2018-02-26 DIAGNOSIS — M2550 Pain in unspecified joint: Secondary | ICD-10-CM | POA: Diagnosis not present

## 2018-03-10 DIAGNOSIS — M818 Other osteoporosis without current pathological fracture: Secondary | ICD-10-CM | POA: Diagnosis not present

## 2018-03-10 DIAGNOSIS — J449 Chronic obstructive pulmonary disease, unspecified: Secondary | ICD-10-CM | POA: Diagnosis not present

## 2018-03-10 DIAGNOSIS — I5022 Chronic systolic (congestive) heart failure: Secondary | ICD-10-CM | POA: Diagnosis not present

## 2018-03-10 DIAGNOSIS — M6281 Muscle weakness (generalized): Secondary | ICD-10-CM | POA: Diagnosis not present

## 2018-03-28 DIAGNOSIS — I5022 Chronic systolic (congestive) heart failure: Secondary | ICD-10-CM | POA: Diagnosis not present

## 2018-03-28 DIAGNOSIS — M2550 Pain in unspecified joint: Secondary | ICD-10-CM | POA: Diagnosis not present

## 2018-03-29 DIAGNOSIS — I5022 Chronic systolic (congestive) heart failure: Secondary | ICD-10-CM | POA: Diagnosis not present

## 2018-03-29 DIAGNOSIS — M2550 Pain in unspecified joint: Secondary | ICD-10-CM | POA: Diagnosis not present

## 2018-04-05 ENCOUNTER — Ambulatory Visit: Payer: Medicare Other

## 2018-04-08 DIAGNOSIS — M6281 Muscle weakness (generalized): Secondary | ICD-10-CM | POA: Diagnosis not present

## 2018-04-08 DIAGNOSIS — M818 Other osteoporosis without current pathological fracture: Secondary | ICD-10-CM | POA: Diagnosis not present

## 2018-04-08 DIAGNOSIS — J449 Chronic obstructive pulmonary disease, unspecified: Secondary | ICD-10-CM | POA: Diagnosis not present

## 2018-04-08 DIAGNOSIS — I5022 Chronic systolic (congestive) heart failure: Secondary | ICD-10-CM | POA: Diagnosis not present

## 2018-04-25 ENCOUNTER — Ambulatory Visit: Payer: Medicare Other | Admitting: Family Medicine

## 2018-04-28 ENCOUNTER — Ambulatory Visit
Admission: RE | Admit: 2018-04-28 | Discharge: 2018-04-28 | Disposition: A | Discharge status: Home / Self Care | Payer: Medicare Other | Source: Ambulatory Visit | Attending: Family Medicine | Admitting: Family Medicine

## 2018-04-28 ENCOUNTER — Ambulatory Visit (INDEPENDENT_AMBULATORY_CARE_PROVIDER_SITE_OTHER): Payer: Medicare Other | Admitting: Family Medicine

## 2018-04-28 ENCOUNTER — Encounter: Payer: Self-pay | Admitting: Family Medicine

## 2018-04-28 VITALS — BP 140/78 | HR 74 | Temp 98.2°F | Resp 20 | Ht 61.5 in | Wt 182.0 lb

## 2018-04-28 DIAGNOSIS — I5022 Chronic systolic (congestive) heart failure: Secondary | ICD-10-CM | POA: Diagnosis not present

## 2018-04-28 DIAGNOSIS — M25552 Pain in left hip: Secondary | ICD-10-CM

## 2018-04-28 DIAGNOSIS — M1612 Unilateral primary osteoarthritis, left hip: Secondary | ICD-10-CM | POA: Diagnosis not present

## 2018-04-28 DIAGNOSIS — M2550 Pain in unspecified joint: Secondary | ICD-10-CM | POA: Diagnosis not present

## 2018-04-28 NOTE — Progress Notes (Signed)
Subjective:    Patient ID: [name], female    DOB: 16-Aug-1922, 84 y.o.   MRN: [id number]  HPI Patient reports a 2-week history of pain over the lateral aspect of her left hip and also the iliac crest on the left side.  History is complicated by age-related memory loss.  Patient states that she is fallen several times but she is uncertain if the pain began after she fell.  Patient is exquisitely tender to palpation over the greater trochanter.  However she is also very tender to palpation in the left gluteus and also over the left iliac crest.  Pain is out of proportion to exam.  Flexion, abduction, and external rotation does not elicit any pain.  Flexion adduction and internal rotation is not elicit any pain in the left hip.  There is no palpable soft tissue mass in the left gluteus.  There is no palpable hematoma or bruise or abscess.  However the patient literally jumps and screams when I touch the skin over the greater trochanter and in her left gluteus. Past Medical History:  Diagnosis Date  . Arthritis   . Cardiomyopathy ([place])    a. ? ischemic vs non-ischemic;  b. 09/2013 Echo: EF 25-30%.  . Chronic systolic CHF (congestive heart failure) ([place])    a. 10/2013 Echo: EF 25-30%, diff HK, Gr 1 DD, triv AI, mild to mod MR, dev dil LA, mild RV dysfxn, mildly dil RA, mod TR.  . CKD (chronic kidney disease), stage III ([place])   . COPD (chronic obstructive pulmonary disease) ([place])   . Depression   . GERD (gastroesophageal reflux disease)   . Glaucoma of both eyes   . Gout   . Hypertension   . Kidney stones   . Obesity   . Tachy-brady syndrome ([place])    a. 03/2006 s/p SJM 5356 Verity ADx XLDR DC PPM (ser#: [id number]).   Past Surgical History:  Procedure Laterality Date  . CARDIAC CATHETERIZATION  10/2000   Archie Endo 10/10/2010  . CHOLECYSTECTOMY OPEN    . INSERT / REPLACE / REMOVE PACEMAKER  08/2000; 03/2006   Archie Endo 10/07/2010  . KIDNEY STONE SURGERY     "hospitalized for 19 days"  . TUBAL  LIGATION     Current Outpatient Medications on File Prior to Visit  Medication Sig Dispense Refill  . albuterol (PROVENTIL) (2.5 MG/3ML) 0.083% nebulizer solution INHALE 1 VIAL VIA NEBULIZER EVERY 6 HOURS AS NEEDED FOR WHEEZING AND SHORTNESS OF BREATH 150 mL 4  . allopurinol (ZYLOPRIM) 100 MG tablet TAKE 1 TABLET BY MOUTH EVERY DAY 30 tablet 11  . ALPRAZolam (XANAX) 0.25 MG tablet Take 1 tablet (0.25 mg total) by mouth 2 (two) times daily as needed for anxiety. 20 tablet 0  . amLODipine (NORVASC) 5 MG tablet TAKE ONE TABLET BY MOUTH ONCE DAILY. 90 tablet 3  . aspirin 81 MG tablet Take 1 tablet (81 mg total) by mouth daily. 30 tablet 11  . budesonide-formoterol (SYMBICORT) 160-4.5 MCG/ACT inhaler Inhale 2 puffs into the lungs 2 (two) times daily. 10.2 g 1  . carvedilol (COREG) 6.25 MG tablet TAKE 1 TABLET BY MOUTH TWICE DAILY WITH A MEAL 60 tablet 5  . citalopram (CELEXA) 10 MG tablet TAKE 1 TABLET BY MOUTH DAILY 30 tablet 11  . ferrous sulfate 325 (65 FE) MG EC tablet Take 325 mg by mouth daily with breakfast.    . furosemide (LASIX) 20 MG tablet TAKE ONE TABLET BY MOUTH TWO TIMES DAILY. 60 tablet  3  . OXYGEN Inhale 2 L into the lungs as needed. Reported on 11/29/2015    . pantoprazole (PROTONIX) 40 MG tablet Take 1 tablet (40 mg total) by mouth 2 (two) times daily. 60 tablet 11  . SPIRIVA HANDIHALER 18 MCG inhalation capsule INHALE THE CONTENTS OF 1 CAPSULE VIA HANDIHALER BY MOUTH EVERY DAY 30 capsule 3  . SYMBICORT 160-4.5 MCG/ACT inhaler INHALE 2 PUFFS BY MOUTH TWICE DAILY 10.2 Inhaler 10  . tiotropium (SPIRIVA HANDIHALER) 18 MCG inhalation capsule INHALE THE CONTENTS OF 1 CAPSULE VIA HANDIHALER BY MOUTH EVERY DAY 30 capsule 1   No current facility-administered medications on file prior to visit.    No Known Allergies Social History   Socioeconomic History  . Marital status: Widowed    Spouse name: Not on file  . Number of children: Not on file  . Years of education: Not on file  .  Highest education level: Not on file  Occupational History  . Not on file  Social Needs  . Financial resource strain: Not on file  . Food insecurity:    Worry: Not on file    Inability: Not on file  . Transportation needs:    Medical: Not on file    Non-medical: Not on file  Tobacco Use  . Smoking status: Former Smoker    Types: Cigarettes  . Smokeless tobacco: Never Used  . Tobacco comment: "never inhaled cigarettes; just lit them"  Substance and Sexual Activity  . Alcohol use: Yes    Comment: 07/23/2014 "I used to drink beer; nothing in 3-4 years"  . Drug use: No  . Sexual activity: Never  Lifestyle  . Physical activity:    Days per week: Not on file    Minutes per session: Not on file  . Stress: Not on file  Relationships  . Social connections:    Talks on phone: Not on file    Gets together: Not on file    Attends religious service: Not on file    Active member of club or organization: Not on file    Attends meetings of clubs or organizations: Not on file    Relationship status: Not on file  . Intimate partner violence:    Fear of current or ex partner: Not on file    Emotionally abused: Not on file    Physically abused: Not on file    Forced sexual activity: Not on file  Other Topics Concern  . Not on file  Social History Narrative  . Not on file      Review of Systems  All other systems reviewed and are negative.      Objective:   Physical Exam  Cardiovascular: Normal rate and regular rhythm.  Pulmonary/Chest: Effort normal and breath sounds normal.  Musculoskeletal:       Left hip: She exhibits tenderness and bony tenderness. She exhibits normal range of motion, normal strength, no swelling, no deformity and no laceration.       Legs: Vitals reviewed.         Assessment & Plan:  Left hip pain - Plan: DG HIP UNILAT WITH PELVIS 2-3 VIEWS LEFT  Pain is out of proportion to exam.  I believe we need to start by obtaining imaging of the left hip.   Therefore I will obtain an x-ray of the left hip joint as well as the pelvis particular given her recent falls to rule out an occult fracture.  I would also like to rule  out any skeletal malignancy/lytic lesion that could explain the patient's significant bone pain.  Await the results of the x-ray prior to determine next course of treatment.  I do not believe there is a fracture in the left hip given her normal range of motion and lack of pain with internal and external rotation.  She is also able to bear weight without pain.  However she has a difficult time even sleeping on her left side.  Perhaps this is very severe right her trochanteric bursitis but I would like to obtain the x-ray first to evaluate other possible causes.

## 2018-05-02 ENCOUNTER — Other Ambulatory Visit: Payer: Self-pay | Admitting: Family Medicine

## 2018-05-02 MED ORDER — PREDNISONE 20 MG PO TABS: ORAL_TABLET | ORAL | 0 refills | Status: DC

## 2018-05-03 ENCOUNTER — Other Ambulatory Visit: Payer: Self-pay | Admitting: *Deleted

## 2018-05-03 MED ORDER — CITALOPRAM HYDROBROMIDE 10 MG PO TABS: 10.0000 mg | ORAL_TABLET | Freq: Every day | ORAL | 11 refills | Status: DC

## 2018-05-03 MED ORDER — FUROSEMIDE 20 MG PO TABS: 20.0000 mg | ORAL_TABLET | Freq: Two times a day (BID) | ORAL | 3 refills | Status: DC

## 2018-05-03 MED ORDER — ALBUTEROL SULFATE (2.5 MG/3ML) 0.083% IN NEBU: INHALATION_SOLUTION | RESPIRATORY_TRACT | 4 refills | Status: DC

## 2018-05-20 ENCOUNTER — Other Ambulatory Visit: Payer: Self-pay | Admitting: Family Medicine

## 2018-05-24 DIAGNOSIS — I5022 Chronic systolic (congestive) heart failure: Secondary | ICD-10-CM | POA: Diagnosis not present

## 2018-05-24 DIAGNOSIS — J449 Chronic obstructive pulmonary disease, unspecified: Secondary | ICD-10-CM | POA: Diagnosis not present

## 2018-05-24 DIAGNOSIS — M6281 Muscle weakness (generalized): Secondary | ICD-10-CM | POA: Diagnosis not present

## 2018-05-24 DIAGNOSIS — M818 Other osteoporosis without current pathological fracture: Secondary | ICD-10-CM | POA: Diagnosis not present

## 2018-05-26 ENCOUNTER — Other Ambulatory Visit: Payer: Self-pay | Admitting: Family Medicine

## 2018-05-27 ENCOUNTER — Other Ambulatory Visit: Payer: Self-pay | Admitting: Family Medicine

## 2018-05-29 DIAGNOSIS — M2550 Pain in unspecified joint: Secondary | ICD-10-CM | POA: Diagnosis not present

## 2018-05-29 DIAGNOSIS — I5022 Chronic systolic (congestive) heart failure: Secondary | ICD-10-CM | POA: Diagnosis not present

## 2018-06-29 DIAGNOSIS — I5022 Chronic systolic (congestive) heart failure: Secondary | ICD-10-CM | POA: Diagnosis not present

## 2018-06-29 DIAGNOSIS — M2550 Pain in unspecified joint: Secondary | ICD-10-CM | POA: Diagnosis not present

## 2018-07-07 DIAGNOSIS — I5022 Chronic systolic (congestive) heart failure: Secondary | ICD-10-CM | POA: Diagnosis not present

## 2018-07-07 DIAGNOSIS — M6281 Muscle weakness (generalized): Secondary | ICD-10-CM | POA: Diagnosis not present

## 2018-07-07 DIAGNOSIS — J449 Chronic obstructive pulmonary disease, unspecified: Secondary | ICD-10-CM | POA: Diagnosis not present

## 2018-07-07 DIAGNOSIS — M818 Other osteoporosis without current pathological fracture: Secondary | ICD-10-CM | POA: Diagnosis not present

## 2018-07-27 ENCOUNTER — Other Ambulatory Visit: Payer: Self-pay | Admitting: Family Medicine

## 2018-07-28 DIAGNOSIS — M6281 Muscle weakness (generalized): Secondary | ICD-10-CM | POA: Diagnosis not present

## 2018-07-28 DIAGNOSIS — R609 Edema, unspecified: Secondary | ICD-10-CM | POA: Diagnosis not present

## 2018-07-28 DIAGNOSIS — M818 Other osteoporosis without current pathological fracture: Secondary | ICD-10-CM | POA: Diagnosis not present

## 2018-07-28 DIAGNOSIS — I5022 Chronic systolic (congestive) heart failure: Secondary | ICD-10-CM | POA: Diagnosis not present

## 2018-08-15 DIAGNOSIS — R260 Ataxic gait: Secondary | ICD-10-CM | POA: Diagnosis not present

## 2018-08-15 DIAGNOSIS — R269 Unspecified abnormalities of gait and mobility: Secondary | ICD-10-CM | POA: Diagnosis not present

## 2018-08-15 DIAGNOSIS — J449 Chronic obstructive pulmonary disease, unspecified: Secondary | ICD-10-CM | POA: Diagnosis not present

## 2018-08-28 DIAGNOSIS — M818 Other osteoporosis without current pathological fracture: Secondary | ICD-10-CM | POA: Diagnosis not present

## 2018-08-28 DIAGNOSIS — I5022 Chronic systolic (congestive) heart failure: Secondary | ICD-10-CM | POA: Diagnosis not present

## 2018-08-28 DIAGNOSIS — R609 Edema, unspecified: Secondary | ICD-10-CM | POA: Diagnosis not present

## 2018-08-28 DIAGNOSIS — M6281 Muscle weakness (generalized): Secondary | ICD-10-CM | POA: Diagnosis not present

## 2018-09-12 DIAGNOSIS — R260 Ataxic gait: Secondary | ICD-10-CM | POA: Diagnosis not present

## 2018-09-12 DIAGNOSIS — J449 Chronic obstructive pulmonary disease, unspecified: Secondary | ICD-10-CM | POA: Diagnosis not present

## 2018-09-12 DIAGNOSIS — R269 Unspecified abnormalities of gait and mobility: Secondary | ICD-10-CM | POA: Diagnosis not present

## 2018-09-21 ENCOUNTER — Other Ambulatory Visit: Payer: Self-pay | Admitting: Family Medicine

## 2018-09-27 ENCOUNTER — Other Ambulatory Visit: Payer: Self-pay | Admitting: *Deleted

## 2018-09-27 DIAGNOSIS — I5022 Chronic systolic (congestive) heart failure: Secondary | ICD-10-CM | POA: Diagnosis not present

## 2018-09-27 DIAGNOSIS — R609 Edema, unspecified: Secondary | ICD-10-CM | POA: Diagnosis not present

## 2018-09-27 DIAGNOSIS — M818 Other osteoporosis without current pathological fracture: Secondary | ICD-10-CM | POA: Diagnosis not present

## 2018-09-27 DIAGNOSIS — M6281 Muscle weakness (generalized): Secondary | ICD-10-CM | POA: Diagnosis not present

## 2018-09-27 NOTE — Patient Outreach (Signed)
[facility]) Care Management  09/27/2018  JAELYNE DEEG Jun 10, 1922 [id number]  Late entry: [place] CM case closure/ documentation only   Patient designated not active with [facility] CM;  [facility] CM previous cases re-closed today as instructed  Oneta Rack, RN, BSN, [place] Coordinator [facility] Care Management  [phone]

## 2018-10-12 DIAGNOSIS — J449 Chronic obstructive pulmonary disease, unspecified: Secondary | ICD-10-CM | POA: Diagnosis not present

## 2018-10-12 DIAGNOSIS — R269 Unspecified abnormalities of gait and mobility: Secondary | ICD-10-CM | POA: Diagnosis not present

## 2018-10-12 DIAGNOSIS — R260 Ataxic gait: Secondary | ICD-10-CM | POA: Diagnosis not present

## 2018-10-19 ENCOUNTER — Telehealth: Payer: Self-pay | Admitting: Family Medicine

## 2018-10-19 NOTE — Telephone Encounter (Signed)
Patient called in today stating that she fell and hit her head on the wall. Patient states that she now has headache that has been persistent since the fall. I advised patient to go to the ER with her risk factors at her age for further evaluation. Patient verbalized understanding.

## 2018-10-21 ENCOUNTER — Other Ambulatory Visit: Payer: Self-pay

## 2018-10-21 ENCOUNTER — Ambulatory Visit (INDEPENDENT_AMBULATORY_CARE_PROVIDER_SITE_OTHER): Payer: Medicare Other | Admitting: Family Medicine

## 2018-10-21 DIAGNOSIS — M25551 Pain in right hip: Secondary | ICD-10-CM

## 2018-10-21 DIAGNOSIS — M25561 Pain in right knee: Secondary | ICD-10-CM

## 2018-10-21 DIAGNOSIS — W19XXXA Unspecified fall, initial encounter: Secondary | ICD-10-CM | POA: Diagnosis not present

## 2018-10-21 NOTE — Progress Notes (Signed)
Subjective:    Patient ID: [name], female    DOB: 1922-07-29, 85 y.o.   MRN: [id number]  HPI  Patient is being seen today by telephone.  She consents to be seen by telephone.  Patient is currently at home alone.  I am currently in my office.  Phone call began at 2:00.  Phone call ended at 210. 1 week ago Thursday, May 21, the patient was walking in her home when she lost her balance and fell striking her right knee and landing on her right gluteus.  As she fell the back of her head struck against the wall.  She was unable to stand up.  She called paramedics and they helped her get out of the floor.  She did not lose consciousness.  However over the last week, she has experienced pain in her right anterior knee.  She states the patella is tender to touch.  She also complains of pain in her right gluteus per tickly when she sits down over the ischial spine.  She also complains of pain in her right hip whenever she stands and tries to walk.  The pain is mild to moderate.  She is definitely tender to the touch in her posterior right gluteus where she landed on her backside.  She does report a dull constant headache in the back of her head but she denies any nausea or vomiting.  She denies any loss of consciousness.  She denies any blurry vision or double vision.  The headache is mild.  She denies any new dizziness or confusion. Past Medical History:  Diagnosis Date  . Arthritis   . Cardiomyopathy ([place])    a. ? ischemic vs non-ischemic;  b. 09/2013 Echo: EF 25-30%.  . Chronic systolic CHF (congestive heart failure) ([place])    a. 10/2013 Echo: EF 25-30%, diff HK, Gr 1 DD, triv AI, mild to mod MR, dev dil LA, mild RV dysfxn, mildly dil RA, mod TR.  . CKD (chronic kidney disease), stage III ([place])   . COPD (chronic obstructive pulmonary disease) ([place])   . Depression   . GERD (gastroesophageal reflux disease)   . Glaucoma of both eyes   . Gout   . Hypertension   . Kidney stones   . Obesity   .  Tachy-brady syndrome ([place])    a. 03/2006 s/p SJM 5356 Verity ADx XLDR DC PPM (ser#: [id number]).   Past Surgical History:  Procedure Laterality Date  . CARDIAC CATHETERIZATION  10/2000   Archie Endo 10/10/2010  . CHOLECYSTECTOMY OPEN    . INSERT / REPLACE / REMOVE PACEMAKER  08/2000; 03/2006   Archie Endo 10/07/2010  . KIDNEY STONE SURGERY     "hospitalized for 19 days"  . TUBAL LIGATION     Current Outpatient Medications on File Prior to Visit  Medication Sig Dispense Refill  . albuterol (PROVENTIL) (2.5 MG/3ML) 0.083% nebulizer solution INHALE 1 VIAL VIA NEBULIZER EVERY 6 HOURS AS NEEDED FOR WHEEZING AND SHORTNESS OF BREATH 150 mL 4  . allopurinol (ZYLOPRIM) 100 MG tablet TAKE 1 TABLET BY MOUTH EVERY DAY 30 tablet 11  . ALPRAZolam (XANAX) 0.25 MG tablet Take 1 tablet (0.25 mg total) by mouth 2 (two) times daily as needed for anxiety. 20 tablet 0  . amLODipine (NORVASC) 5 MG tablet TAKE ONE TABLET BY MOUTH ONCE DAILY. 90 tablet 3  . aspirin 81 MG tablet Take 1 tablet (81 mg total) by mouth daily. 30 tablet 11  . budesonide-formoterol (SYMBICORT) 160-4.5 MCG/ACT  inhaler Inhale 2 puffs into the lungs 2 (two) times daily. 10.2 g 1  . carvedilol (COREG) 6.25 MG tablet TAKE 1 TABLET BY MOUTH TWICE DAILY WITH A MEAL 60 tablet 5  . citalopram (CELEXA) 10 MG tablet TAKE 1 TABLET BY MOUTH DAILY 30 tablet 11  . ferrous sulfate 325 (65 FE) MG EC tablet Take 325 mg by mouth daily with breakfast.    . furosemide (LASIX) 20 MG tablet TAKE 1 TABLET BY MOUTH TWICE DAILY 60 tablet 2  . OXYGEN Inhale 2 L into the lungs as needed. Reported on 11/29/2015    . pantoprazole (PROTONIX) 40 MG tablet TAKE 1 TABLET BY MOUTH TWICE DAILY 60 tablet 10  . predniSONE (DELTASONE) 20 MG tablet 3 tabs po qd x 2 days, 2 tabs po qd x 2 days, 1 tab po qd x 2 days 12 tablet 0  . SPIRIVA HANDIHALER 18 MCG inhalation capsule INHALE THE CONTENTS OF 1 CAPSULE VIA HANDIHALER BY MOUTH EVERY DAY 30 capsule 2  . SYMBICORT 160-4.5 MCG/ACT inhaler  INHALE 2 PUFFS BY MOUTH TWICE DAILY 10.2 Inhaler 10  . tiotropium (SPIRIVA HANDIHALER) 18 MCG inhalation capsule INHALE THE CONTENTS OF 1 CAPSULE VIA HANDIHALER BY MOUTH EVERY DAY 30 capsule 1   No current facility-administered medications on file prior to visit.    No Known Allergies Social History   Socioeconomic History  . Marital status: Widowed    Spouse name: Not on file  . Number of children: Not on file  . Years of education: Not on file  . Highest education level: Not on file  Occupational History  . Not on file  Social Needs  . Financial resource strain: Not on file  . Food insecurity:    Worry: Not on file    Inability: Not on file  . Transportation needs:    Medical: Not on file    Non-medical: Not on file  Tobacco Use  . Smoking status: Former Smoker    Types: Cigarettes  . Smokeless tobacco: Never Used  . Tobacco comment: "never inhaled cigarettes; just lit them"  Substance and Sexual Activity  . Alcohol use: Yes    Comment: 07/23/2014 "I used to drink beer; nothing in 3-4 years"  . Drug use: No  . Sexual activity: Never  Lifestyle  . Physical activity:    Days per week: Not on file    Minutes per session: Not on file  . Stress: Not on file  Relationships  . Social connections:    Talks on phone: Not on file    Gets together: Not on file    Attends religious service: Not on file    Active member of club or organization: Not on file    Attends meetings of clubs or organizations: Not on file    Relationship status: Not on file  . Intimate partner violence:    Fear of current or ex partner: Not on file    Emotionally abused: Not on file    Physically abused: Not on file    Forced sexual activity: Not on file  Other Topics Concern  . Not on file  Social History Narrative  . Not on file     Review of Systems  All other systems reviewed and are negative.      Objective:   Physical Exam Physical exam could not be performed today as the patient  was seen over the telephone.  However she was talking in full and complete sentences.  There was no disorientation.  She was alert and oriented x3.  She seemed to have no distress and was not in significant pain.  She has been walking around the house using her walker for the last week       Assessment & Plan:  Right anterior knee pain - Plan: DG Knee Complete 4 Views Right  Posterior pain of hip, right - Plan: DG Hip Unilat W OR W/O Pelvis 2-3 Views Right  Fall, initial encounter  I suspect the patient has suffered a contusion to her right patella as well as to her right gluteus.  However given her advanced age, I do believe it is warranted to obtain an x-ray to rule out fracture.  Therefore I recommend an x-ray of the right knee as well as the right hip and pelvis.  I believe the patient suffered a contusion to her occiput and is likely having some mild headaches related to this.  She denies any symptoms of a concussion.  Fall occurred a week ago and therefore I have a very low suspicion for any type of serious intracranial bleed given the patient's lack of symptoms and her normal neurologic status today on the telephone.  Therefore I do not feel that imaging is required of the brain.  I have ordered the x-rays at [facility] imaging and the patient requested that I order them there and she will get her daughter to take her for the x-rays

## 2018-10-24 ENCOUNTER — Other Ambulatory Visit: Payer: Self-pay

## 2018-10-24 ENCOUNTER — Ambulatory Visit
Admission: RE | Admit: 2018-10-24 | Discharge: 2018-10-24 | Disposition: A | Discharge status: Home / Self Care | Payer: Medicare Other | Source: Ambulatory Visit | Attending: Family Medicine | Admitting: Family Medicine

## 2018-10-24 DIAGNOSIS — M25561 Pain in right knee: Secondary | ICD-10-CM

## 2018-10-24 DIAGNOSIS — M25551 Pain in right hip: Secondary | ICD-10-CM

## 2018-10-24 DIAGNOSIS — M25461 Effusion, right knee: Secondary | ICD-10-CM | POA: Diagnosis not present

## 2018-10-24 DIAGNOSIS — S70251A Superficial foreign body, right hip, initial encounter: Secondary | ICD-10-CM | POA: Diagnosis not present

## 2018-10-28 DIAGNOSIS — I5022 Chronic systolic (congestive) heart failure: Secondary | ICD-10-CM | POA: Diagnosis not present

## 2018-10-28 DIAGNOSIS — M818 Other osteoporosis without current pathological fracture: Secondary | ICD-10-CM | POA: Diagnosis not present

## 2018-10-28 DIAGNOSIS — R609 Edema, unspecified: Secondary | ICD-10-CM | POA: Diagnosis not present

## 2018-10-28 DIAGNOSIS — M6281 Muscle weakness (generalized): Secondary | ICD-10-CM | POA: Diagnosis not present

## 2018-11-01 ENCOUNTER — Other Ambulatory Visit: Payer: Self-pay | Admitting: Family Medicine

## 2018-11-15 ENCOUNTER — Ambulatory Visit (INDEPENDENT_AMBULATORY_CARE_PROVIDER_SITE_OTHER): Payer: Medicare Other | Admitting: Internal Medicine

## 2018-11-15 ENCOUNTER — Telehealth: Payer: Self-pay

## 2018-11-15 ENCOUNTER — Other Ambulatory Visit: Payer: Self-pay

## 2018-11-15 VITALS — BP 128/68 | HR 80 | Wt 182.0 lb

## 2018-11-15 DIAGNOSIS — Z950 Presence of cardiac pacemaker: Secondary | ICD-10-CM

## 2018-11-15 DIAGNOSIS — I495 Sick sinus syndrome: Secondary | ICD-10-CM | POA: Diagnosis not present

## 2018-11-15 NOTE — Telephone Encounter (Signed)

## 2018-11-15 NOTE — Progress Notes (Signed)
Patient Care Team: [name], MD as PCP - General (Family Medicine)   HPI  [name] is a 85 y.o. female Seen in follow-up for [facility]  pacemaker implanted 2002 for tachybradycardia syndrome. She underwent generator change 2007 with an RA lead revision.  She has NICM    Oxygen-dependent COPD w obesity hypoventilation  No edema or SOB   Still walks some   w a walker  Echo  EF 6/15 25-30%  EF 3/16 45-50%  Date Cr K Hgb  7/19 2.18 4.9 10.3           Records and Results Reviewed   Past Medical History:  Diagnosis Date  . Arthritis   . Cardiomyopathy ([place])    a. ? ischemic vs non-ischemic;  b. 09/2013 Echo: EF 25-30%.  . Chronic systolic CHF (congestive heart failure) ([place])    a. 10/2013 Echo: EF 25-30%, diff HK, Gr 1 DD, triv AI, mild to mod MR, dev dil LA, mild RV dysfxn, mildly dil RA, mod TR.  . CKD (chronic kidney disease), stage III ([place])   . COPD (chronic obstructive pulmonary disease) ([place])   . Depression   . GERD (gastroesophageal reflux disease)   . Glaucoma of both eyes   . Gout   . Hypertension   . Kidney stones   . Obesity   . Tachy-brady syndrome ([place])    a. 03/2006 s/p SJM 5356 Verity ADx XLDR DC PPM (ser#: [id number]).    Past Surgical History:  Procedure Laterality Date  . CARDIAC CATHETERIZATION  10/2000   Archie Endo 10/10/2010  . CHOLECYSTECTOMY OPEN    . INSERT / REPLACE / REMOVE PACEMAKER  08/2000; 03/2006   Archie Endo 10/07/2010  . KIDNEY STONE SURGERY     "hospitalized for 19 days"  . TUBAL LIGATION      Current Outpatient Medications  Medication Sig Dispense Refill  . albuterol (PROVENTIL) (2.5 MG/3ML) 0.083% nebulizer solution INHALE 1 VIAL VIA NEBULIZER EVERY 6 HOURS AS NEEDED FOR WHEEZING AND SHORTNESS OF BREATH 150 mL 4  . allopurinol (ZYLOPRIM) 100 MG tablet TAKE 1 TABLET BY MOUTH EVERY DAY 30 tablet 11  . ALPRAZolam (XANAX) 0.25 MG tablet Take 1 tablet (0.25 mg total) by mouth 2 (two) times daily as needed for anxiety. 20  tablet 0  . amLODipine (NORVASC) 5 MG tablet TAKE ONE TABLET BY MOUTH ONCE DAILY. 90 tablet 3  . aspirin 81 MG tablet Take 1 tablet (81 mg total) by mouth daily. 30 tablet 11  . budesonide-formoterol (SYMBICORT) 160-4.5 MCG/ACT inhaler Inhale 2 puffs into the lungs 2 (two) times daily. 10.2 g 1  . carvedilol (COREG) 6.25 MG tablet TAKE 1 TABLET BY MOUTH TWICE DAILY WITH A MEAL 60 tablet 5  . citalopram (CELEXA) 10 MG tablet TAKE 1 TABLET BY MOUTH DAILY 30 tablet 11  . ferrous sulfate 325 (65 FE) MG EC tablet Take 325 mg by mouth daily with breakfast.    . furosemide (LASIX) 20 MG tablet TAKE 1 TABLET BY MOUTH TWICE DAILY 60 tablet 2  . OXYGEN Inhale 2 L into the lungs as needed. Reported on 11/29/2015    . pantoprazole (PROTONIX) 40 MG tablet TAKE 1 TABLET BY MOUTH TWICE DAILY 60 tablet 10  . SPIRIVA HANDIHALER 18 MCG inhalation capsule INHALE THE CONTENTS OF 1 CAPSULE VIA HANDIHALER BY MOUTH EVERY DAY 30 capsule 1  . SYMBICORT 160-4.5 MCG/ACT inhaler INHALE 2 PUFFS BY MOUTH TWICE DAILY 1 Inhaler 9  .  tiotropium (SPIRIVA HANDIHALER) 18 MCG inhalation capsule INHALE THE CONTENTS OF 1 CAPSULE VIA HANDIHALER BY MOUTH EVERY DAY 30 capsule 1  . predniSONE (DELTASONE) 20 MG tablet 3 tabs po qd x 2 days, 2 tabs po qd x 2 days, 1 tab po qd x 2 days (Patient not taking: Reported on 11/15/2018) 12 tablet 0   No current facility-administered medications for this visit.     No Known Allergies    Review of Systems negative except from HPI and PMH  Physical Exam BP 128/68 (BP Location: Right Arm, Patient Position: Sitting, Cuff Size: Normal) Comment: irregular  Pulse 80   Wt 182 lb (82.6 kg)   BMI 33.83 kg/m  Well developed and well nourished in no acute distress asleep in the wheelchair  HENT normal Neck supple  Device pocket well healed; without hematoma or erythema.  There is no tethering Device R sided Regular rate and rhythm,  Abd-soft with active BS No Clubbing cyanosis 1+  edema Skin-warm  and dry A & Oriented  Grossly normal sensory and motor function    ECG sinus 70 24/10/40  Assessment and  Plan  Pacemaker-[employer] The patient's device was interrogated.  The information was reviewed. No changes were made in the programming.     HFrEF/HFpEF  Hypertension  Sinus node dysfunction    BP well controlled  Wonder whether her edema is related to amlodipine-- defer to PCP  A paces at 60 15% Vp 2%  Have reprogrammed pacemaker [id number] to ascertain pacing needs and try to help determine risk benefit for 3rd procedure  Discussed w [employer] Jude-- ERI reached 10/27/18 and now voltage has reset to 2.65 which may afford even more than 3 months to make the above determination    Will need to talk w family at next visit

## 2018-11-15 NOTE — Patient Instructions (Signed)
Medication Instructions:  - Your physician recommends that you continue on your current medications as directed. Please refer to the Current Medication list given to you today.  If you need a refill on your cardiac medications before your next appointment, please call your pharmacy.   Lab work: - none ordered  If you have labs (blood work) drawn today and your tests are completely normal, you will receive your results only by: Marland Kitchen MyChart Message (if you have MyChart) OR . A paper copy in the mail If you have any lab test that is abnormal or we need to change your treatment, we will call you to review the results.  Testing/Procedures: - none ordered  Follow-Up: At [facility], you and your health needs are our priority.  As part of our continuing mission to provide you with exceptional heart care, we have created designated Provider Care Teams.  These Care Teams include your primary Cardiologist (physician) and Advanced Practice Providers (APPs -  Physician Assistants and Nurse Practitioners) who all work together to provide you with the care you need, when you need it. You will need a follow up appointment in 3 months (September) with Dr. Caryl Comes  Please call our office 2 months in advance to schedule this appointment. (call in mid July to schedule) .  Any Other Special Instructions Will Be Listed Below (If Applicable). - N/A

## 2018-11-16 DIAGNOSIS — R269 Unspecified abnormalities of gait and mobility: Secondary | ICD-10-CM | POA: Diagnosis not present

## 2018-11-16 DIAGNOSIS — R260 Ataxic gait: Secondary | ICD-10-CM | POA: Diagnosis not present

## 2018-11-16 DIAGNOSIS — J449 Chronic obstructive pulmonary disease, unspecified: Secondary | ICD-10-CM | POA: Diagnosis not present

## 2018-11-27 DIAGNOSIS — M818 Other osteoporosis without current pathological fracture: Secondary | ICD-10-CM | POA: Diagnosis not present

## 2018-11-27 DIAGNOSIS — R609 Edema, unspecified: Secondary | ICD-10-CM | POA: Diagnosis not present

## 2018-11-27 DIAGNOSIS — I5022 Chronic systolic (congestive) heart failure: Secondary | ICD-10-CM | POA: Diagnosis not present

## 2018-11-27 DIAGNOSIS — M6281 Muscle weakness (generalized): Secondary | ICD-10-CM | POA: Diagnosis not present

## 2018-12-06 ENCOUNTER — Other Ambulatory Visit: Payer: Self-pay | Admitting: Internal Medicine

## 2018-12-19 ENCOUNTER — Encounter: Payer: Self-pay | Admitting: Family Medicine

## 2018-12-19 ENCOUNTER — Other Ambulatory Visit: Payer: Self-pay

## 2018-12-19 ENCOUNTER — Ambulatory Visit (INDEPENDENT_AMBULATORY_CARE_PROVIDER_SITE_OTHER): Payer: Medicare Other | Admitting: Family Medicine

## 2018-12-19 VITALS — BP 158/90 | HR 121 | Temp 98.2°F | Resp 22 | Ht 61.5 in

## 2018-12-19 DIAGNOSIS — I499 Cardiac arrhythmia, unspecified: Secondary | ICD-10-CM | POA: Diagnosis not present

## 2018-12-19 DIAGNOSIS — N184 Chronic kidney disease, stage 4 (severe): Secondary | ICD-10-CM

## 2018-12-19 DIAGNOSIS — J441 Chronic obstructive pulmonary disease with (acute) exacerbation: Secondary | ICD-10-CM

## 2018-12-19 DIAGNOSIS — H6122 Impacted cerumen, left ear: Secondary | ICD-10-CM | POA: Diagnosis not present

## 2018-12-19 MED ORDER — PREDNISONE 20 MG PO TABS: ORAL_TABLET | ORAL | 0 refills | Status: AC

## 2018-12-19 NOTE — Progress Notes (Signed)
Subjective:    Patient ID: [name], female    DOB: [birth year], 85 y.o.   MRN: [id number]  HPI  When I enter the room, the patient is tachypneic and tachycardic.  She seems to be in distress.  She looks extremely anxious.  She is hyperventilating.  Pulse oximetry was 95% on room air.  She has a difficult time even answering my questions.  She is physically shaking.  Both arms are shaking.  On exam, she is tachypneic although lung sounds show good air exchange with no crackles or rails.  There is some faint expiratory wheezing but not substantial.  She is tachycardic with a heart rate in the 120 bpm range.  Her heart rate is regular.  Patient appears to be extremely anxious and in distress.  I immediately obtained an EKG. EKG shows sinus tachycardia with occasional PVCs.  There are Q waves in lead II, III, aVF, V5, and V6.  There is nonspecific ST segment depression and T wave inversions in lead I and aVL as well as the anterior leads.  However all of this is chronic and present on EKGs from earlier this year and in 2019.  After talking with the patient for a while she began to relax.  Her respiratory rate slowed.  Her breath sounds improved.  Her heart rate slowed into the 90s.  She started to calm down.  Patient appear to be having a panic attack.  She originally made the appointment for hearing loss in her left ear.  After talking with the patient for a while I examined her left auditory canal and found a cerumen impaction in her left ear.  Daughter who accompanies her states that she has been acting anxious all morning.  She was afraid to come to the doctor due to the COVID-19 pandemic along with she was afraid of what I was going to tell her. Past medical history is significant for tachybradycardia syndrome status post pacemaker implantation.  She also has a history of nonischemic cardiomyopathy. Past Medical History:  Diagnosis Date  . Arthritis   . Cardiomyopathy ([place])    a. ? ischemic vs  non-ischemic;  b. 09/2013 Echo: EF 25-30%.  . Chronic systolic CHF (congestive heart failure) ([place])    a. 10/2013 Echo: EF 25-30%, diff HK, Gr 1 DD, triv AI, mild to mod MR, dev dil LA, mild RV dysfxn, mildly dil RA, mod TR.  . CKD (chronic kidney disease), stage III ([place])   . COPD (chronic obstructive pulmonary disease) ([place])   . Depression   . GERD (gastroesophageal reflux disease)   . Glaucoma of both eyes   . Gout   . Hypertension   . Kidney stones   . Obesity   . Tachy-brady syndrome ([place])    a. 03/2006 s/p SJM 5356 Verity ADx XLDR DC PPM (ser#: [id number]).   Past Surgical History:  Procedure Laterality Date  . CARDIAC CATHETERIZATION  10/2000   Archie Endo 10/10/2010  . CHOLECYSTECTOMY OPEN    . INSERT / REPLACE / REMOVE PACEMAKER  08/2000; 03/2006   Archie Endo 10/07/2010  . KIDNEY STONE SURGERY     "hospitalized for 19 days"  . TUBAL LIGATION     Current Outpatient Medications on File Prior to Visit  Medication Sig Dispense Refill  . albuterol (PROVENTIL) (2.5 MG/3ML) 0.083% nebulizer solution INHALE 1 VIAL VIA NEBULIZER EVERY 6 HOURS AS NEEDED FOR WHEEZING AND SHORTNESS OF BREATH 150 mL 4  . allopurinol (ZYLOPRIM) 100 MG tablet TAKE  1 TABLET BY MOUTH EVERY DAY 30 tablet 11  . ALPRAZolam (XANAX) 0.25 MG tablet Take 1 tablet (0.25 mg total) by mouth 2 (two) times daily as needed for anxiety. 20 tablet 0  . amLODipine (NORVASC) 5 MG tablet TAKE ONE TABLET BY MOUTH ONCE DAILY. 90 tablet 3  . aspirin 81 MG tablet Take 1 tablet (81 mg total) by mouth daily. 30 tablet 11  . budesonide-formoterol (SYMBICORT) 160-4.5 MCG/ACT inhaler Inhale 2 puffs into the lungs 2 (two) times daily. 10.2 g 1  . carvedilol (COREG) 6.25 MG tablet TAKE 1 TABLET BY MOUTH TWICE DAILY WITH A MEAL 60 tablet 5  . citalopram (CELEXA) 10 MG tablet TAKE 1 TABLET BY MOUTH DAILY 30 tablet 11  . ferrous sulfate 325 (65 FE) MG EC tablet Take 325 mg by mouth daily with breakfast.    . furosemide (LASIX) 20 MG tablet TAKE 1 TABLET  BY MOUTH TWICE DAILY 60 tablet 2  . OXYGEN Inhale 2 L into the lungs as needed. Reported on 11/29/2015    . pantoprazole (PROTONIX) 40 MG tablet TAKE 1 TABLET BY MOUTH TWICE DAILY 60 tablet 10  . SPIRIVA HANDIHALER 18 MCG inhalation capsule INHALE THE CONTENTS OF 1 CAPSULE VIA HANDIHALER BY MOUTH EVERY DAY 30 capsule 1  . SYMBICORT 160-4.5 MCG/ACT inhaler INHALE 2 PUFFS BY MOUTH TWICE DAILY 1 Inhaler 9  . tiotropium (SPIRIVA HANDIHALER) 18 MCG inhalation capsule INHALE THE CONTENTS OF 1 CAPSULE VIA HANDIHALER BY MOUTH EVERY DAY 30 capsule 1   No current facility-administered medications on file prior to visit.    No Known Allergies Social History   Socioeconomic History  . Marital status: Widowed    Spouse name: Not on file  . Number of children: Not on file  . Years of education: Not on file  . Highest education level: Not on file  Occupational History  . Not on file  Social Needs  . Financial resource strain: Not on file  . Food insecurity    Worry: Not on file    Inability: Not on file  . Transportation needs    Medical: Not on file    Non-medical: Not on file  Tobacco Use  . Smoking status: Former Smoker    Types: Cigarettes  . Smokeless tobacco: Never Used  . Tobacco comment: "never inhaled cigarettes; just lit them"  Substance and Sexual Activity  . Alcohol use: Yes    Comment: 07/23/2014 "I used to drink beer; nothing in 3-4 years"  . Drug use: No  . Sexual activity: Never  Lifestyle  . Physical activity    Days per week: Not on file    Minutes per session: Not on file  . Stress: Not on file  Relationships  . Social Herbalist on phone: Not on file    Gets together: Not on file    Attends religious service: Not on file    Active member of club or organization: Not on file    Attends meetings of clubs or organizations: Not on file    Relationship status: Not on file  . Intimate partner violence    Fear of current or ex partner: Not on file     Emotionally abused: Not on file    Physically abused: Not on file    Forced sexual activity: Not on file  Other Topics Concern  . Not on file  Social History Narrative  . Not on file     Review of  Systems  All other systems reviewed and are negative.      Objective:   Physical Exam Constitutional:      General: She is in acute distress.     Appearance: She is not ill-appearing, toxic-appearing or diaphoretic.  Cardiovascular:     Rate and Rhythm: Tachycardia present. Rhythm irregular.     Heart sounds: Normal heart sounds.  Pulmonary:     Effort: Pulmonary effort is normal. No respiratory distress.     Breath sounds: No stridor. No wheezing, rhonchi or rales.  Abdominal:     General: Abdomen is flat. Bowel sounds are normal.     Palpations: Abdomen is soft.  Musculoskeletal:     Right lower leg: Edema present.     Left lower leg: Edema present.  Neurological:     Mental Status: She is alert.  Psychiatric:        Mood and Affect: Mood is anxious.    Originally the patient appeared extremely anxious and in distress.  She was tachycardic and tachypneic and hyperventilating.  However after practicing some deep breathing exercises with the patient and talking to her, she began to relax.  Her heart rate slowed down dramatically into the 90s.  It was still irregular due to PACs.  Her respiratory rate slowed and she was able to speak full and complete sentences with no distress.  Her anxiety improved therefore later in the exam she was a normal affect with a normal heart rate and no tachypnea.  However she continued to demonstrate some mild expiratory wheezing.  She appears to be having mild bronchospasm I suspect due to the heat.       Assessment & Plan:  1. Irregular heartbeat Patient has a history of tachybradycardia syndrome status post pacemaker implantation.  I reviewed Dr. Olin Pia most recent office visit.  They are trying to delay replacement of her pacemaker for as long  as possible due to her advanced age.  There is no evidence of bradycardia today on her exam for sure.  She is in normal sinus rhythm with frequent PACs exacerbated by an anxiety attack.  After the patient calmed down, her heart rate returned to normal aside from some PACs. - EKG 45-XMIW - BASIC METABOLIC PANEL WITH GFR  2. COPD exacerbation ([place]) Patient does appear to be having mild bronchospasms.  I recommended a prednisone taper pack.  She can use her nebulizer as needed for wheezing.  3. CKD (chronic kidney disease), stage IV (HCC) Monitor potassium and renal function with a BMP  4. Hearing loss due to cerumen impaction, left The original office visit was scheduled for hearing loss in her left ear which appears to be due to a cerumen impaction.  This was removed easily with irrigation and lavage revealing a perfectly healthy tympanic membrane and no evidence of perforation or infection.  Her hearing loss improved after removing her cerumen impaction.

## 2018-12-20 LAB — BASIC METABOLIC PANEL WITH GFR
BUN/Creatinine Ratio: 21 (calc) (ref 6–22)
BUN: 39 mg/dL — ABNORMAL HIGH (ref 7–25)
CO2: 27 mmol/L (ref 20–32)
Calcium: 8.8 mg/dL (ref 8.6–10.4)
Chloride: 107 mmol/L (ref 98–110)
Creat: 1.87 mg/dL — ABNORMAL HIGH (ref 0.60–0.88)
GFR, Est African American: 26 mL/min/{1.73_m2} — ABNORMAL LOW (ref >=60)
GFR, Est Non African American: 23 mL/min/{1.73_m2} — ABNORMAL LOW (ref >=60)
Glucose, Bld: 95 mg/dL (ref 65–99)
Potassium: 4.3 mmol/L (ref 3.5–5.3)
Sodium: 139 mmol/L (ref 135–146)

## 2018-12-26 DIAGNOSIS — R260 Ataxic gait: Secondary | ICD-10-CM | POA: Diagnosis not present

## 2018-12-26 DIAGNOSIS — J449 Chronic obstructive pulmonary disease, unspecified: Secondary | ICD-10-CM | POA: Diagnosis not present

## 2018-12-26 DIAGNOSIS — R269 Unspecified abnormalities of gait and mobility: Secondary | ICD-10-CM | POA: Diagnosis not present

## 2018-12-28 DIAGNOSIS — M818 Other osteoporosis without current pathological fracture: Secondary | ICD-10-CM | POA: Diagnosis not present

## 2018-12-28 DIAGNOSIS — R609 Edema, unspecified: Secondary | ICD-10-CM | POA: Diagnosis not present

## 2018-12-28 DIAGNOSIS — M6281 Muscle weakness (generalized): Secondary | ICD-10-CM | POA: Diagnosis not present

## 2018-12-28 DIAGNOSIS — I5022 Chronic systolic (congestive) heart failure: Secondary | ICD-10-CM | POA: Diagnosis not present

## 2018-12-31 ENCOUNTER — Emergency Department
Admission: EM | Admit: 2018-12-31 | Discharge: 2018-12-31 | Disposition: A | Discharge status: Other Acute Inpatient Hospital | Payer: Medicare Other | Attending: Emergency Medicine | Admitting: Emergency Medicine

## 2018-12-31 ENCOUNTER — Other Ambulatory Visit: Payer: Self-pay

## 2018-12-31 ENCOUNTER — Inpatient Hospital Stay (HOSPITAL_COMMUNITY)
Admission: AD | Admit: 2018-12-31 | Discharge: 2019-01-24 | DRG: 177 | Disposition: E | Payer: Medicare Other | Source: Other Acute Inpatient Hospital | Attending: Internal Medicine | Admitting: Internal Medicine

## 2018-12-31 ENCOUNTER — Encounter (HOSPITAL_COMMUNITY): Payer: Self-pay

## 2018-12-31 ENCOUNTER — Emergency Department: Payer: Medicare Other

## 2018-12-31 ENCOUNTER — Telehealth: Payer: Self-pay | Admitting: Internal Medicine

## 2018-12-31 DIAGNOSIS — J80 Acute respiratory distress syndrome: Secondary | ICD-10-CM | POA: Diagnosis not present

## 2018-12-31 DIAGNOSIS — E861 Hypovolemia: Secondary | ICD-10-CM | POA: Diagnosis not present

## 2018-12-31 DIAGNOSIS — I130 Hypertensive heart and chronic kidney disease with heart failure and stage 1 through stage 4 chronic kidney disease, or unspecified chronic kidney disease: Secondary | ICD-10-CM | POA: Diagnosis not present

## 2018-12-31 DIAGNOSIS — J1289 Other viral pneumonia: Secondary | ICD-10-CM | POA: Diagnosis not present

## 2018-12-31 DIAGNOSIS — Z515 Encounter for palliative care: Secondary | ICD-10-CM | POA: Diagnosis not present

## 2018-12-31 DIAGNOSIS — H409 Unspecified glaucoma: Secondary | ICD-10-CM | POA: Diagnosis present

## 2018-12-31 DIAGNOSIS — Z8249 Family history of ischemic heart disease and other diseases of the circulatory system: Secondary | ICD-10-CM | POA: Diagnosis not present

## 2018-12-31 DIAGNOSIS — Z7982 Long term (current) use of aspirin: Secondary | ICD-10-CM | POA: Diagnosis not present

## 2018-12-31 DIAGNOSIS — J9601 Acute respiratory failure with hypoxia: Secondary | ICD-10-CM | POA: Diagnosis present

## 2018-12-31 DIAGNOSIS — Z823 Family history of stroke: Secondary | ICD-10-CM

## 2018-12-31 DIAGNOSIS — J440 Chronic obstructive pulmonary disease with acute lower respiratory infection: Secondary | ICD-10-CM | POA: Diagnosis not present

## 2018-12-31 DIAGNOSIS — J069 Acute upper respiratory infection, unspecified: Secondary | ICD-10-CM | POA: Diagnosis present

## 2018-12-31 DIAGNOSIS — G9341 Metabolic encephalopathy: Secondary | ICD-10-CM | POA: Diagnosis not present

## 2018-12-31 DIAGNOSIS — L89152 Pressure ulcer of sacral region, stage 2: Secondary | ICD-10-CM | POA: Diagnosis not present

## 2018-12-31 DIAGNOSIS — N179 Acute kidney failure, unspecified: Secondary | ICD-10-CM | POA: Diagnosis not present

## 2018-12-31 DIAGNOSIS — E870 Hyperosmolality and hypernatremia: Secondary | ICD-10-CM | POA: Diagnosis not present

## 2018-12-31 DIAGNOSIS — J449 Chronic obstructive pulmonary disease, unspecified: Secondary | ICD-10-CM | POA: Diagnosis not present

## 2018-12-31 DIAGNOSIS — I509 Heart failure, unspecified: Secondary | ICD-10-CM | POA: Diagnosis not present

## 2018-12-31 DIAGNOSIS — Z950 Presence of cardiac pacemaker: Secondary | ICD-10-CM | POA: Diagnosis not present

## 2018-12-31 DIAGNOSIS — I5023 Acute on chronic systolic (congestive) heart failure: Secondary | ICD-10-CM | POA: Diagnosis present

## 2018-12-31 DIAGNOSIS — I110 Hypertensive heart disease with heart failure: Secondary | ICD-10-CM | POA: Diagnosis not present

## 2018-12-31 DIAGNOSIS — Z87891 Personal history of nicotine dependence: Secondary | ICD-10-CM

## 2018-12-31 DIAGNOSIS — R0902 Hypoxemia: Secondary | ICD-10-CM | POA: Diagnosis not present

## 2018-12-31 DIAGNOSIS — L89153 Pressure ulcer of sacral region, stage 3: Secondary | ICD-10-CM | POA: Diagnosis not present

## 2018-12-31 DIAGNOSIS — Z7951 Long term (current) use of inhaled steroids: Secondary | ICD-10-CM | POA: Diagnosis not present

## 2018-12-31 DIAGNOSIS — U071 COVID-19: Secondary | ICD-10-CM | POA: Insufficient documentation

## 2018-12-31 DIAGNOSIS — I361 Nonrheumatic tricuspid (valve) insufficiency: Secondary | ICD-10-CM | POA: Diagnosis not present

## 2018-12-31 DIAGNOSIS — R05 Cough: Secondary | ICD-10-CM | POA: Diagnosis not present

## 2018-12-31 DIAGNOSIS — Z79899 Other long term (current) drug therapy: Secondary | ICD-10-CM

## 2018-12-31 DIAGNOSIS — L8990 Pressure ulcer of unspecified site, unspecified stage: Secondary | ICD-10-CM | POA: Insufficient documentation

## 2018-12-31 DIAGNOSIS — Z66 Do not resuscitate: Secondary | ICD-10-CM | POA: Diagnosis not present

## 2018-12-31 DIAGNOSIS — I340 Nonrheumatic mitral (valve) insufficiency: Secondary | ICD-10-CM | POA: Diagnosis not present

## 2018-12-31 DIAGNOSIS — N184 Chronic kidney disease, stage 4 (severe): Secondary | ICD-10-CM | POA: Diagnosis present

## 2018-12-31 DIAGNOSIS — N281 Cyst of kidney, acquired: Secondary | ICD-10-CM | POA: Diagnosis not present

## 2018-12-31 LAB — CBC WITH DIFFERENTIAL/PLATELET
Abs Immature Granulocytes: 0.06 10*3/uL (ref 0.00–0.07)
Basophils Absolute: 0 10*3/uL (ref 0.0–0.1)
Basophils Relative: 0 %
Eosinophils Absolute: 0 10*3/uL (ref 0.0–0.5)
Eosinophils Relative: 0 %
HCT: 31.7 % — ABNORMAL LOW (ref 36.0–46.0)
Hemoglobin: 10.2 g/dL — ABNORMAL LOW (ref 12.0–15.0)
Immature Granulocytes: 1 %
Lymphocytes Relative: 15 %
Lymphs Abs: 1.4 10*3/uL (ref 0.7–4.0)
MCH: 29.3 pg (ref 26.0–34.0)
MCHC: 32.2 g/dL (ref 30.0–36.0)
MCV: 91.1 fL (ref 80.0–100.0)
Monocytes Absolute: 0.4 10*3/uL (ref 0.1–1.0)
Monocytes Relative: 4 %
Neutro Abs: 7.6 10*3/uL (ref 1.7–7.7)
Neutrophils Relative %: 80 %
Platelets: 192 10*3/uL (ref 150–400)
RBC: 3.48 MIL/uL — ABNORMAL LOW (ref 3.87–5.11)
RDW: 15.2 % (ref 11.5–15.5)
WBC: 9.5 10*3/uL (ref 4.0–10.5)
nRBC: 0.7 % — ABNORMAL HIGH (ref 0.0–0.2)

## 2018-12-31 LAB — COMPREHENSIVE METABOLIC PANEL
ALT: 13 U/L (ref 0–44)
AST: 30 U/L (ref 15–41)
Albumin: 2.8 g/dL — ABNORMAL LOW (ref 3.5–5.0)
Alkaline Phosphatase: 67 U/L (ref 38–126)
Anion gap: 16 — ABNORMAL HIGH (ref 5–15)
BUN: 84 mg/dL — ABNORMAL HIGH (ref 8–23)
CO2: 18 mmol/L — ABNORMAL LOW (ref 22–32)
Calcium: 7 mg/dL — ABNORMAL LOW (ref 8.9–10.3)
Chloride: 106 mmol/L (ref 98–111)
Creatinine, Ser: 5.1 mg/dL — ABNORMAL HIGH (ref 0.44–1.00)
GFR calc Af Amer: 8 mL/min — ABNORMAL LOW (ref >60)
GFR calc non Af Amer: 7 mL/min — ABNORMAL LOW (ref >60)
Glucose, Bld: 106 mg/dL — ABNORMAL HIGH (ref 70–99)
Potassium: 5 mmol/L (ref 3.5–5.1)
Sodium: 140 mmol/L (ref 135–145)
Total Bilirubin: 1.2 mg/dL (ref 0.3–1.2)
Total Protein: 6.9 g/dL (ref 6.5–8.1)

## 2018-12-31 LAB — OSMOLALITY: Osmolality: 326 mOsm/kg (ref 275–295)

## 2018-12-31 LAB — SARS CORONAVIRUS 2 BY RT PCR (HOSPITAL ORDER, PERFORMED IN ~~LOC~~ HOSPITAL LAB): SARS Coronavirus 2: POSITIVE — AB

## 2018-12-31 LAB — FIBRIN DERIVATIVES D-DIMER (ARMC ONLY): Fibrin derivatives D-dimer (ARMC): 5282.25 ng/mL (FEU) — ABNORMAL HIGH (ref 0.00–499.00)

## 2018-12-31 LAB — PROTIME-INR
INR: 1.1 (ref 0.8–1.2)
Prothrombin Time: 14.3 seconds (ref 11.4–15.2)

## 2018-12-31 LAB — BRAIN NATRIURETIC PEPTIDE: B Natriuretic Peptide: 1587 pg/mL — ABNORMAL HIGH (ref 0.0–100.0)

## 2018-12-31 LAB — LACTIC ACID, PLASMA
Lactic Acid, Venous: 2.2 mmol/L (ref 0.5–1.9)
Lactic Acid, Venous: 1.4 mmol/L (ref 0.5–1.9)

## 2018-12-31 LAB — FERRITIN: Ferritin: 832 ng/mL — ABNORMAL HIGH (ref 11–307)

## 2018-12-31 LAB — AMMONIA: Ammonia: <9 umol/L — ABNORMAL LOW (ref 9–35)

## 2018-12-31 MED ORDER — METHYLPREDNISOLONE SODIUM SUCC 40 MG IJ SOLR: 40.0000 mg | Freq: Two times a day (BID) | INTRAMUSCULAR | Status: DC

## 2018-12-31 MED ORDER — HEPARIN SODIUM (PORCINE) 10000 UNIT/ML IJ SOLN
7500.0000 [IU] | Freq: Three times a day (TID) | INTRAMUSCULAR | Status: DC
  Administered 2019-01-01 – 2019-01-07 (×20): 7500 [IU] via SUBCUTANEOUS
  Filled 2018-12-31 (×20): qty 1

## 2018-12-31 MED ORDER — FUROSEMIDE 10 MG/ML IJ SOLN
80.0000 mg | Freq: Once | INTRAMUSCULAR | Status: DC
  Filled 2018-12-31: qty 8

## 2018-12-31 MED ORDER — FUROSEMIDE 10 MG/ML IJ SOLN: 80.0000 mg | Freq: Once | INTRAMUSCULAR | Status: DC

## 2018-12-31 MED ORDER — METHYLPREDNISOLONE SODIUM SUCC 40 MG IJ SOLR
40.0000 mg | Freq: Two times a day (BID) | INTRAMUSCULAR | Status: DC
  Administered 2019-01-01 – 2019-01-03 (×6): 40 mg via INTRAVENOUS
  Filled 2018-12-31 (×6): qty 1

## 2018-12-31 MED ORDER — UMECLIDINIUM BROMIDE 62.5 MCG/INH IN AEPB
1.0000 | INHALATION_SPRAY | Freq: Every day | RESPIRATORY_TRACT | Status: DC
  Administered 2019-01-03 – 2019-01-07 (×5): 1 via RESPIRATORY_TRACT
  Filled 2018-12-31: qty 7

## 2018-12-31 MED ORDER — METHYLPREDNISOLONE SODIUM SUCC 125 MG IJ SOLR
60.0000 mg | Freq: Once | INTRAMUSCULAR | Status: AC
  Administered 2018-12-31: 60 mg via INTRAVENOUS
  Filled 2018-12-31: qty 2

## 2018-12-31 MED ORDER — CARVEDILOL 3.125 MG PO TABS
6.2500 mg | ORAL_TABLET | Freq: Two times a day (BID) | ORAL | Status: DC
  Administered 2019-01-01 (×2): 6.25 mg via ORAL
  Filled 2018-12-31 (×2): qty 2

## 2018-12-31 MED ORDER — SODIUM CHLORIDE 0.9 % IV SOLN
Freq: Once | INTRAVENOUS | Status: AC
  Administered 2018-12-31: 13:00:00 via INTRAVENOUS

## 2018-12-31 MED ORDER — ALBUTEROL SULFATE HFA 108 (90 BASE) MCG/ACT IN AERS
2.0000 | INHALATION_SPRAY | Freq: Four times a day (QID) | RESPIRATORY_TRACT | Status: DC | PRN
  Administered 2019-01-04 – 2019-01-06 (×3): 2 via RESPIRATORY_TRACT
  Filled 2018-12-31: qty 6.7

## 2018-12-31 MED ORDER — ALBUTEROL SULFATE HFA 108 (90 BASE) MCG/ACT IN AERS
2.0000 | INHALATION_SPRAY | Freq: Four times a day (QID) | RESPIRATORY_TRACT | Status: DC
  Filled 2018-12-31: qty 6.7

## 2018-12-31 MED ORDER — SODIUM CHLORIDE 0.9 % IV SOLN
100.0000 mg | INTRAVENOUS | Status: AC
  Administered 2019-01-01 – 2019-01-04 (×4): 100 mg via INTRAVENOUS
  Filled 2018-12-31 (×5): qty 20

## 2018-12-31 MED ORDER — ONDANSETRON HCL 4 MG PO TABS: 4.0000 mg | ORAL_TABLET | Freq: Four times a day (QID) | ORAL | Status: DC | PRN

## 2018-12-31 MED ORDER — SODIUM CHLORIDE 0.9 % IV SOLN
200.0000 mg | Freq: Once | INTRAVENOUS | Status: AC
  Administered 2019-01-01: 200 mg via INTRAVENOUS
  Filled 2018-12-31: qty 40

## 2018-12-31 MED ORDER — SODIUM CHLORIDE 0.9 % IV SOLN: INTRAVENOUS | Status: DC

## 2018-12-31 MED ORDER — TIOTROPIUM BROMIDE MONOHYDRATE 18 MCG IN CAPS: 18.0000 ug | ORAL_CAPSULE | Freq: Every day | RESPIRATORY_TRACT | Status: DC

## 2018-12-31 MED ORDER — MOMETASONE FURO-FORMOTEROL FUM 200-5 MCG/ACT IN AERO
2.0000 | INHALATION_SPRAY | Freq: Two times a day (BID) | RESPIRATORY_TRACT | Status: DC
  Administered 2019-01-01 – 2019-01-07 (×10): 2 via RESPIRATORY_TRACT
  Filled 2018-12-31: qty 8.8

## 2018-12-31 MED ORDER — ONDANSETRON HCL 4 MG/2ML IJ SOLN: 4.0000 mg | Freq: Four times a day (QID) | INTRAMUSCULAR | Status: DC | PRN

## 2018-12-31 MED ORDER — ACETAMINOPHEN 325 MG PO TABS: 650.0000 mg | ORAL_TABLET | Freq: Four times a day (QID) | ORAL | Status: DC | PRN

## 2018-12-31 MED ORDER — FUROSEMIDE 10 MG/ML IJ SOLN
80.0000 mg | Freq: Once | INTRAMUSCULAR | Status: AC
  Administered 2019-01-01: 80 mg via INTRAVENOUS
  Filled 2018-12-31: qty 8

## 2018-12-31 NOTE — ED Notes (Signed)
Unable to reach [place], pt's next emergency contact noted in chart. Spoke with Loni Beckwith, pt's granddaughter who consents to transfer to [facility] for COVID-19 treatment.

## 2018-12-31 NOTE — ED Notes (Signed)
Unsuccessful foley insertion x 2.  Pt had brown/black diarrhea. Cleaned with wipes and clean brief placed on pt.

## 2018-12-31 NOTE — ED Notes (Signed)
Per MD verbal order, try to wean pt of bipap and place on nonre-breather. Taken off bipap by this RN and placed on 10 L nonre-breather. Pt tolerating at 95%, unless laid down then destats. Pt sat up at this time.

## 2018-12-31 NOTE — ED Triage Notes (Signed)
Pt arrives from home where there were about 20 people in apartment. Arrives [facility]. EMS states family was unable to provide information about pt and were angry on scene with EMS. Pt arrives with very congested cough and EMS reported "[place] looking urine" when they moved pt. Pt was placed on 4 L [place] enroute.

## 2018-12-31 NOTE — ED Notes (Signed)
Report to kate, rn.  

## 2018-12-31 NOTE — ED Notes (Signed)
Pt was placed on a nonrebreather at 15LPM, oxygen came up to 93%.

## 2018-12-31 NOTE — H&P (Signed)
History and Physical    [name] [id number] DOB: November 01, 1922 DOA: @ADMITDT @  PCP: [name], MD  Patient coming from: Home  I have personally briefly reviewed patient's old medical records in [place]  Chief Complaint: Cough  HPI: [name] is a 85 y.o. female with medical history significant of HFrEF with 25-30% EF (though looks like last echo was in 2015), Tachy-brady syndrome s/p PPM which needs battery replacement in near future though they are trying to delay given patients age, CKD stage 4 with baseline creat ~2.0 last month.  Patient [facility] ED at [facility] with worsening SOB and dark urine.  Patient has had poor PO intake and not taken any meds for the past 4 days per granddaughter whom I spoke with over phone.  Review of chart and med rec shows that she had a steroid taper ordered on 7/27 for possible COPD exacerbation.  Per EMS Satting low 80s initially, low 90s on NRB in ED.   ED Course: BNP 1587 (significantly elevated from prior BNPs), Creat 5.1 (up from 2.0 on 7/27).  CXR suggestive of cardiogenic pulmonary edema.  Doesn't look like they gave lasix in ED.  D.Dimer 5282 ng/ml  Patient given solumedrol.  Transferred to [facility] for admission.   Review of Systems: unable to perform due to AMS.  Past Medical History:  Diagnosis Date  . Arthritis   . Cardiomyopathy ([place])    a. ? ischemic vs non-ischemic;  b. 09/2013 Echo: EF 25-30%.  . Chronic systolic CHF (congestive heart failure) ([place])    a. 10/2013 Echo: EF 25-30%, diff HK, Gr 1 DD, triv AI, mild to mod MR, dev dil LA, mild RV dysfxn, mildly dil RA, mod TR.  . CKD (chronic kidney disease), stage III ([place])   . COPD (chronic obstructive pulmonary disease) ([place])   . Depression   . GERD (gastroesophageal reflux disease)   . Glaucoma of both eyes   . Gout   . Hypertension   . Kidney stones   . Obesity   . Tachy-brady syndrome ([place])    a. 03/2006 s/p SJM 5356 Verity ADx XLDR DC PPM  (ser#: [id number]).    Past Surgical History:  Procedure Laterality Date  . CARDIAC CATHETERIZATION  10/2000   Archie Endo 10/10/2010  . CHOLECYSTECTOMY OPEN    . INSERT / REPLACE / REMOVE PACEMAKER  08/2000; 03/2006   Archie Endo 10/07/2010  . KIDNEY STONE SURGERY     "hospitalized for 19 days"  . TUBAL LIGATION       reports that she has quit smoking. Her smoking use included cigarettes. She has never used smokeless tobacco. She reports current alcohol use. She reports that she does not use drugs.  No Known Allergies  Family History  Problem Relation Age of Onset  . Hypertension Mother   . Heart attack Neg Hx   . Stroke Neg Hx      Prior to Admission medications   Medication Sig Start Date End Date Taking? Authorizing Provider  albuterol (PROVENTIL) (2.5 MG/3ML) 0.083% nebulizer solution INHALE 1 VIAL VIA NEBULIZER EVERY 6 HOURS AS NEEDED FOR WHEEZING AND SHORTNESS OF BREATH 05/27/18   [name], MD  allopurinol (ZYLOPRIM) 100 MG tablet TAKE 1 TABLET BY MOUTH EVERY DAY 11/02/18   [name], MD  ALPRAZolam Duanne Moron) 0.25 MG tablet Take 1 tablet (0.25 mg total) by mouth 2 (two) times daily as needed for anxiety. 12/06/17   [name], MD  amLODipine (  NORVASC) 5 MG tablet TAKE ONE TABLET BY MOUTH ONCE DAILY. 09/22/18   [name], MD  aspirin 81 MG tablet Take 1 tablet (81 mg total) by mouth daily. 09/28/16   [name], MD  budesonide-formoterol (SYMBICORT) 160-4.5 MCG/ACT inhaler Inhale 2 puffs into the lungs 2 (two) times daily. 09/28/16   [name], MD  carvedilol (COREG) 6.25 MG tablet TAKE 1 TABLET BY MOUTH TWICE DAILY WITH A MEAL 07/28/18   [name], MD  citalopram (CELEXA) 10 MG tablet TAKE 1 TABLET BY MOUTH DAILY 05/20/18   [name], MD  ferrous sulfate 325 (65 FE) MG EC tablet Take 325 mg by mouth daily with breakfast.    [provider]  furosemide (LASIX) 20 MG tablet TAKE 1 TABLET BY MOUTH TWICE DAILY 09/22/18   [name], MD  OXYGEN Inhale 2 L into the lungs as needed. Reported on 11/29/2015    [provider]  pantoprazole (PROTONIX) 40 MG tablet TAKE 1 TABLET BY MOUTH TWICE DAILY 05/27/18   [name], MD  predniSONE (DELTASONE) 20 MG tablet 3 tabs poqday 1-2, 2 tabs poqday 3-4, 1 tab poqday 5-6 12/19/18   [name], MD  SPIRIVA HANDIHALER 18 MCG inhalation capsule INHALE THE CONTENTS OF 1 CAPSULE VIA HANDIHALER BY MOUTH EVERY DAY 11/02/18   [name], MD  SYMBICORT 160-4.5 MCG/ACT inhaler INHALE 2 PUFFS BY MOUTH TWICE DAILY 11/02/18   [name], MD  tiotropium (SPIRIVA HANDIHALER) 18 MCG inhalation capsule INHALE THE CONTENTS OF 1 CAPSULE VIA HANDIHALER BY MOUTH EVERY DAY 09/28/16   [name], MD    Physical Exam: BP 120/72, T 97.7, RR 28, HR 76, satting low 90s on 15L NRB   Constitutional: NAD, calm, awake, oriented X2 Eyes: PERRL, lids and conjunctivae normal ENMT: Mucous membranes are moist. Posterior pharynx clear of any exudate or lesions.Normal dentition.  Neck: normal, supple, no masses, no thyromegaly Respiratory: B crackles Cardiovascular:IRR IRR but P waves present, No extremity edema. 2+ pedal pulses. No carotid bruits.  Abdomen: no tenderness, no masses palpated. No hepatosplenomegaly. Bowel sounds positive.  Musculoskeletal: no clubbing / cyanosis. No joint deformity upper and lower extremities. Good ROM, no contractures. Normal muscle tone.  Skin: no rashes, lesions, ulcers. No induration Neurologic: CN 2-12 grossly intact. Sensation intact, DTR normal. Strength 5/5 in all 4.     Labs on Admission: I have personally reviewed following labs and imaging studies  CBC: No results for input(s): WBC, NEUTROABS, HGB, HCT, MCV, PLT in the last 168 hours. Basic Metabolic Panel: No results for input(s): NA, K, CL, CO2, GLUCOSE, BUN, CREATININE, CALCIUM, MG, PHOS in the last 168 hours. GFR: Estimated Creatinine Clearance: 18.1 mL/min (A) (by C-G  formula based on SCr of 1.87 mg/dL (H)). Liver Function Tests: No results for input(s): AST, ALT, ALKPHOS, BILITOT, PROT, ALBUMIN in the last 168 hours. No results for input(s): LIPASE, AMYLASE in the last 168 hours. No results for input(s): AMMONIA in the last 168 hours. Coagulation Profile: No results for input(s): INR, PROTIME in the last 168 hours. Cardiac Enzymes: No results for input(s): CKTOTAL, CKMB, CKMBINDEX, TROPONINI in the last 168 hours. BNP (last 3 results) No results for input(s): PROBNP in the last 8760 hours. HbA1C: No results for input(s): HGBA1C in the last 72 hours. CBG: No results for input(s): GLUCAP in the last 168 hours. Lipid Profile: No results for input(s): CHOL, HDL, LDLCALC, TRIG, CHOLHDL, LDLDIRECT in  the last 72 hours. Thyroid Function Tests: No results for input(s): TSH, T4TOTAL, FREET4, T3FREE, THYROIDAB in the last 72 hours. Anemia Panel: No results for input(s): VITAMINB12, FOLATE, FERRITIN, TIBC, IRON, RETICCTPCT in the last 72 hours. Urine analysis:    Component Value Date/Time   COLORURINE DARK YELLOW 11/10/2016 1452   APPEARANCEUR CLEAR 11/10/2016 1452   LABSPEC 1.014 11/10/2016 1452   PHURINE 5.0 11/10/2016 1452   GLUCOSEU NEGATIVE 11/10/2016 1452   HGBUR NEGATIVE 11/10/2016 1452   BILIRUBINUR NEGATIVE 11/10/2016 1452   KETONESUR NEGATIVE 11/10/2016 1452   PROTEINUR NEGATIVE 11/10/2016 1452   UROBILINOGEN 0.2 11/13/2014 1239   NITRITE NEGATIVE 11/10/2016 1452   LEUKOCYTESUR 2+ (A) 11/10/2016 1452    Radiological Exams on Admission: No results found.  EKG: Independently reviewed.  Assessment/Plan Patient Active Problem List   Diagnosis Date Noted  . Acute respiratory disease due to COVID-19 virus 01/23/2019  . Acute kidney failure ([place]) 01/06/2019  . CKD (chronic kidney disease) stage 4, GFR 15-29 ml/min (HCC) 01/21/2019  . Acute on chronic systolic CHF (congestive heart failure) ([place]) 01/21/2019  . Acute metabolic  encephalopathy 0000000       1. Acute resp failure with hypoxia - 1. Due to combination of COVID-19 and acute on chronic CHF 2. COVID-19 1. COVID pathway 2. remdesivir 3. Solumedrol 4. Daily labs 5. CRP pending 6. Cont pulse ox 7. Tele monitor 8. Elevated D.Dimer - wt based heparin [place] dosing for ppx 3. Acute on chronic systolic CHF - 1. BNP very elevated and CXR c/w cardiogenic pulmonary edema 2. Hasnt been able to take any PO meds for past few days. 3. 2d echo 4. Continue coreg 5. Trying 80mg  IV lasix X1 to see how she responds 6. Check troponin 4. AKF on CKD stage 4 - 1. Volume status not entirely clear: HPI raises concern for dehydration, however lab and imaging would suggest fluid overload. 2. UA 3. US renal 4. Daily CMP to follow kidney function 5. Will try 1 dose of IV lasix to see how she responds. 6. Depending on pulmonary and renal response by tomorrow AM doc will need to make decision regarding further lasix vs reversing course and putting on IVF. 7. Bladder scan had less than 200cc 8. Likely warrants nephro consult in AM 5. Acute metabolic encephalopathy - 1. Due to acute critical illness as outlined above 6. COPD - 1. Cont home nebs 2. PRN albuterol 3. Steroids as above 7. HTN - 1. Continue Coreg 2. Hold amlodipine for now  DVT prophylaxis: High dose heparin [place] for PPx Code Status: Full code at this time per daughter and granddaughter Family Communication: Spoke with Granddaughter Disposition Plan: TBD Consults called: None Admission status: Admit to inpatient  Severity of Illness: The appropriate patient status for this patient is INPATIENT. Inpatient status is judged to be reasonable and necessary in order to provide the required intensity of service to ensure the patient's safety. The patient's presenting symptoms, physical exam findings, and initial radiographic and laboratory data in the context of their chronic comorbidities is felt to place them  at high risk for further clinical deterioration. Furthermore, it is not anticipated that the patient will be medically stable for discharge from the hospital within 2 midnights of admission. The following factors support the patient status of inpatient.   IP status for COVID with acute resp failure requiring O2, AKF with creat 5!  AMS, among others issues above.   * I certify that at the point of admission  it is my clinical judgment that the patient will require inpatient hospital care spanning beyond 2 midnights from the point of admission due to high intensity of service, high risk for further deterioration and high frequency of surveillance required.*    Hillari Zumwalt M. DO [facility]  How to contact the [facility] Attending or Consulting provider [place] or covering provider during after hours [place], for this patient?  1. Check the care team in [facility] and look for a) attending/consulting [facility] provider listed and b) the [facility] team listed 2. Log into [email or web address]  Amion Physician Scheduling and messaging for groups and whole hospitals  On call and physician scheduling software for group practices, residents, hospitalists and other medical providers for call, clinic, rotation and shift schedules. On[employer] is a hospital-wide system for scheduling doctors and paging doctors on call. EasyPlot is for scientific plotting and data analysis.  [email or web address]  and use [place]'s universal password to access. If you do not have the password, please contact the hospital operator.  3. Locate the [facility] provider you are looking for under [facility] and page to a number that you can be directly reached. 4. If you still have difficulty reaching the provider, please page the [facility] (Director on Call) for the Hospitalists listed on amion for assistance.  12/28/2018, 9:55 PM

## 2018-12-31 NOTE — ED Notes (Signed)
X-ray at bedside

## 2018-12-31 NOTE — ED Notes (Signed)
Pt placed on bipap  

## 2018-12-31 NOTE — ED Notes (Signed)
Pt brief and skirt wet from urine. Pt cleaned with wipes. Pt stool or urine appeared dark green/black. Chucks placed under pt and new brief placed on pt. Dirty skirt placed in pt belongings bag. Pt re-arranged on stretcher and provided warm blanket.

## 2018-12-31 NOTE — ED Provider Notes (Signed)
[facility] Emergency Department Provider Note ____________________________________________   First MD Initiated Contact with Patient 01/15/2019 1034     (approximate)  I have reviewed the triage vital signs and the nursing notes.   HISTORY  Chief Complaint Cough  Level 5 caveat: History of present illness limited due to respiratory distress and possible altered mental status  HPI [name] is a 85 y.o. female with a PMH of COPD, hypertension, and CHF who presents from home with worsening shortness of breath and dark urine.  The patient denies any pain although she is not able to give much history at this time.   No past medical history on file.  There are no active problems to display for this patient.  (Patient has a chart under another MRN which has yet to be merged)  Prior to Admission medications   Not on File    Allergies Patient has no known allergies.  No family history on file.  Social History Social History   Tobacco Use  . Smoking status: Not on file  Substance Use Topics  . Alcohol use: Not on file  . Drug use: Not on file    Review of Systems Level 5 caveat: Unable to obtain review of systems due to respiratory distress and possible altered mental status    ____________________________________________   PHYSICAL EXAM:  VITAL SIGNS: ED Triage Vitals  Enc Vitals Group     BP 12/28/2018 1018 (!) 142/68     Pulse Rate 12/25/2018 1015 100     Resp 01/23/2019 1015 (!) 26     Temp 12/29/2018 1015 98.1 F (36.7 C)     Temp Source 01/21/2019 1015 Oral     SpO2 01/20/2019 1018 (!) 83 %     Weight 01/15/2019 1015 190 lb 14.7 oz (86.6 kg)     Height 12/29/2018 1015 5\' 8"  (1.727 m)     Head Circumference --      Peak Flow --      Pain Score 12/28/2018 1012 0     Pain Loc --      Pain Edu? --      Excl. in [place]? --     Constitutional: Alert, slightly confused appearing.   Eyes: Conjunctivae are normal.  Head: Atraumatic. Nose: No  congestion/rhinnorhea. Mouth/Throat: Mucous membranes are slightly dry.   Neck: Normal range of motion.  Cardiovascular: Normal rate, regular rhythm. Grossly normal heart sounds.  Good peripheral circulation. Respiratory: Increased work of breathing with diffuse rhonchi and some rales bilaterally. Gastrointestinal: Soft and nontender. No distention.  Genitourinary: No flank tenderness. Musculoskeletal: No significant lower extremity edema.  Extremities warm and well perfused.  Neurologic: Motor intact in all extremities. Skin:  Skin is warm and dry. No rash noted. Psychiatric: Unable to assess.  ____________________________________________   LABS (all labs ordered are listed, but only abnormal results are displayed)  Labs Reviewed  SARS CORONAVIRUS 2 (HOSPITAL ORDER, [place] [facility]) - Abnormal; Notable for the following components:      Result Value   SARS Coronavirus 2 POSITIVE (*)    All other components within normal limits  COMPREHENSIVE METABOLIC PANEL - Abnormal; Notable for the following components:   CO2 18 (*)    Glucose, Bld 106 (*)    BUN 84 (*)    Creatinine, Ser 5.10 (*)    Calcium 7.0 (*)    Albumin 2.8 (*)    GFR calc non Af Amer 7 (*)  GFR calc Af Amer 8 (*)    Anion gap 16 (*)    All other components within normal limits  LACTIC ACID, PLASMA - Abnormal; Notable for the following components:   Lactic Acid, Venous 2.2 (*)    All other components within normal limits  CBC WITH DIFFERENTIAL/PLATELET - Abnormal; Notable for the following components:   RBC 3.48 (*)    Hemoglobin 10.2 (*)    HCT 31.7 (*)    nRBC 0.7 (*)    All other components within normal limits  BRAIN NATRIURETIC PEPTIDE - Abnormal; Notable for the following components:   B Natriuretic Peptide 1,587.0 (*)    All other components within normal limits  AMMONIA - Abnormal; Notable for the following components:   Ammonia <9 (*)    All other components within normal  limits  CULTURE, BLOOD (ROUTINE X 2)  CULTURE, BLOOD (ROUTINE X 2)  LACTIC ACID, PLASMA  PROTIME-INR  URINALYSIS, COMPLETE (UACMP) WITH MICROSCOPIC  C-REACTIVE PROTEIN  FIBRIN DERIVATIVES D-DIMER ([facility] ONLY)  FERRITIN  OSMOLALITY  CREATININE, URINE, RANDOM  OSMOLALITY, URINE  SODIUM, URINE, RANDOM  URINALYSIS, ROUTINE W REFLEX MICROSCOPIC   ____________________________________________  EKG  ED ECG REPORT I, Arta Silence, the attending physician, personally viewed and interpreted this ECG.  Date: 12/30/2018 EKG Time: 1016 Rate: 91 Rhythm: normal sinus rhythm with PACs QRS Axis: normal Intervals: normal ST/T Wave abnormalities: Nonspecific ST abnormalities Narrative Interpretation: no evidence of acute ischemia  ____________________________________________  RADIOLOGY  CXR: Patchy bilateral infiltrates with cardiomegaly  ____________________________________________   PROCEDURES  Procedure(s) performed: No  Procedures  Critical Care performed: Yes  CRITICAL CARE Performed by: Arta Silence   Total critical care time: 35 minutes  Critical care time was exclusive of separately billable procedures and treating other patients.  Critical care was necessary to treat or prevent imminent or life-threatening deterioration.  Critical care was time spent personally by me on the following activities: development of treatment plan with patient and/or surrogate as well as nursing, discussions with consultants, evaluation of patient's response to treatment, examination of patient, obtaining history from patient or surrogate, ordering and performing treatments and interventions, ordering and review of laboratory studies, ordering and review of radiographic studies, pulse oximetry and re-evaluation of patient's condition. ____________________________________________   INITIAL IMPRESSION / ASSESSMENT AND PLAN / ED COURSE  Pertinent labs & imaging results that were  available during my care of the patient were reviewed by me and considered in my medical decision making (see chart for details).  85 year old female with a history of COPD and CHF presents via EMS with worsening shortness of breath over an unknown duration as well as with dark urine.  I reviewed the past medical records in [place].  The patient's last ED visit was in July of last year with shortness of breath.  She has not been hospitalized in the [facility] system since that time.  On exam the patient has increased work of breathing and tachypnea.  There are diffuse rhonchi and some rales bilaterally although no significant peripheral edema.  O2 saturation in the field was in the 80s.  Here on nonrebreather she is in the low 90s.  Overall presentation is most consistent with acute CHF, with likely component of COPD as well.  Differential also includes bacterial pneumonia, viral bronchitis, or COVID-19 pneumonia.  We will obtain chest x-ray, lab work-up, and reassess.  Because of the increased work of breathing and the likelihood of pulmonary edema I will place the patient on BiPAP,  although she is oxygenating adequately on nonrebreather.  ----------------------------------------- 12:22 PM on 01/19/2019 -----------------------------------------  The COVID test is positive.  However, the patient also has an elevated BNP, so I suspect pneumonia related to COVID-19 as well as likely acute on chronic CHF.  [facility].  ----------------------------------------- 3:49 PM on 01/01/2019 -----------------------------------------  The patient was accepted for transfer by Dr. Candiss Norse from [facility].  We weaned the patient off of BiPAP and back onto nonrebreather and she is stable for transfer at this time.    ____________________________________________   FINAL CLINICAL IMPRESSION(S) / ED DIAGNOSES  Final diagnoses:  Acute respiratory failure with hypoxia ([place])  COVID-19   Acute on chronic congestive heart failure, unspecified heart failure type ([place])      NEW MEDICATIONS STARTED DURING THIS VISIT:  New Prescriptions   No medications on file     Note:  This document was prepared using Dragon voice recognition software and may include unintentional dictation errors.    Arta Silence, MD 12/28/2018 1549

## 2018-12-31 NOTE — ED Notes (Signed)
Date and time results received: 01/06/2019 1056   Test: lactic acid  Critical Value: 2.2  Name of Provider Notified: Dr. Cherylann Banas

## 2018-12-31 NOTE — ED Notes (Addendum)
Attempted to call report to [facility] [phone]). Report given to: Estill Bamberg RN

## 2018-12-31 NOTE — Progress Notes (Signed)
[name], is a 85 y.o. female, DOB - 1923/04/27, MRN:[id number]  85 year old female lives in [place] with her daughter with underlying history of tachybradycardia syndrome, COPD, hypertension, CKD stage IV, chronic combined systolic and diastolic CHF note patient has 2 charts in the system below is the other MRN assigned to her.  Patient apparently got sick about a week ago short of breath for 3 to 4 days brought in hypoxic on nonrebreather to [place] ER diagnosed with COVID-19 and transferred here for further care.  She also had ARF on CKD 4 baseline creatinine somewhere around 2.5.  She will be admitted here for medical treatment IV steroids initiated at [facility].  Discussed with patient's daughter in detail she will be DNR.  Medical treatment to continue.  If declines further then comfort care.    Second MRN for the patient - [name] [id number] DOB: [birth year] DOA: 11/29/2015    Vitals:   01/06/2019 1600 01/15/2019 1700 12/30/2018 1730 01/22/2019 1754  BP: 118/88 125/68 (!) 119/56   Pulse:      Resp: (!) 21 (!) 27 (!) 22   Temp:      TempSrc:      SpO2: 93%   98%  Weight:      Height:            Data Review   Micro Results Recent Results (from the past 240 hour(s))  SARS Coronavirus 2 [facility] order, Performed in [facility] hospital lab) Nasopharyngeal Nasopharyngeal Swab     Status: Abnormal   Collection Time: 01/05/2019 10:42 AM   Specimen: Nasopharyngeal Swab  Result Value Ref Range Status   SARS Coronavirus 2 POSITIVE (A) NEGATIVE Final    Comment: RESULT CALLED TO, READ BACK BY AND VERIFIED WITH:  Nadyne Coombes AT [id number] 01/03/2019 SDR (NOTE) If result is NEGATIVE SARS-CoV-2 target nucleic acids are NOT DETECTED. The SARS-CoV-2 RNA is generally detectable in upper and lower  respiratory specimens during the acute phase of infection. The lowest  concentration of SARS-CoV-2 viral  copies this assay can detect is 250  copies / mL. A negative result does not preclude SARS-CoV-2 infection  and should not be used as the sole basis for treatment or other  patient management decisions.  A negative result may occur with  improper specimen collection / handling, submission of specimen other  than nasopharyngeal swab, presence of viral mutation(s) within the  areas targeted by this assay, and inadequate number of viral copies  (<250 copies / mL). A negative result must be combined with clinical  observations, patient history, and epidemiological information. If result is POSITIVE SARS-CoV-2 target nucleic acids are DETECTED.  The SARS-CoV-2 RNA is generally detectable in upper and lower  respiratory specimens during the acute phase of infection.  Positive  results are indicative of active infection with SARS-CoV-2.  Clinical  correlation with patient history and other diagnostic information is  necessary to determine patient infection status.  Positive results do  not rule out bacterial infection or co-infection with other viruses. If result is PRESUMPTIVE POSTIVE SARS-CoV-2 nucleic acids MAY BE PRESENT.   A presumptive positive result was obtained on the submitted specimen  and confirmed on repeat testing.  While 2019 novel coronavirus  (SARS-CoV-2) nucleic acids may be present in the submitted sample  additional confirmatory testing may be necessary for epidemiological  and / or clinical management purposes  to differentiate between  SARS-CoV-2 and other Sarbecovirus currently known to infect humans.  If clinically indicated additional  testing with an alternate test  methodology [phone]) i s advised. The SARS-CoV-2 RNA is generally  detectable in upper and lower respiratory specimens during the acute  phase of infection. The expected result is Negative. Fact Sheet for Patients:  StrictlyIdeas.no Fact Sheet for Healthcare  Providers: BankingDealers.co.za This test is not yet approved or cleared by the [place] FDA and has been authorized for detection and/or diagnosis of SARS-CoV-2 by FDA under an Emergency Use Authorization (EUA).  This EUA will remain in effect (meaning this test can be used) for the duration of the COVID-19 declaration under Section 564(b)(1) of the Act, 21 U.S.C. section 360bbb-3(b)(1), unless the authorization is terminated or revoked sooner. Performed at [facility], [address], [place], [place] [address]     Radiology Reports Dg Chest Portable 1 View  Result Date: 01/11/2019 CLINICAL DATA:  Cough EXAM: PORTABLE CHEST 1 VIEW COMPARISON:  11/24/2017 chest radiograph. FINDINGS: Stable configuration of 3 lead right subclavian pacemaker. Stable cardiomediastinal silhouette with moderate cardiomegaly. No pneumothorax. No pleural effusion. There is patchy parahilar fluffy and linear opacities throughout both lungs. IMPRESSION: Patchy fluffy and linear parahilar opacities throughout both lungs with moderate cardiomegaly, favor cardiogenic pulmonary edema. Electronically Signed   By: Ilona Sorrel M.D.   On: 01/04/2019 11:05    CBC Recent Labs  Lab 01/11/2019 1017  WBC 9.5  HGB 10.2*  HCT 31.7*  PLT 192  MCV 91.1  MCH 29.3  MCHC 32.2  RDW 15.2  LYMPHSABS 1.4  MONOABS 0.4  EOSABS 0.0  BASOSABS 0.0    Chemistries  Recent Labs  Lab 01/20/2019 1017  NA 140  K 5.0  CL 106  CO2 18*  GLUCOSE 106*  BUN 84*  CREATININE 5.10*  CALCIUM 7.0*  AST 30  ALT 13  ALKPHOS 67  BILITOT 1.2   ------------------------------------------------------------------------------------------------------------------ estimated creatinine clearance is 7.8 mL/min (A) (by C-G formula based on SCr of 5.1 mg/dL (H)). ------------------------------------------------------------------------------------------------------------------ No results for input(s): HGBA1C in  the last 72 hours. ------------------------------------------------------------------------------------------------------------------ No results for input(s): CHOL, HDL, LDLCALC, TRIG, CHOLHDL, LDLDIRECT in the last 72 hours. ------------------------------------------------------------------------------------------------------------------ No results for input(s): TSH, T4TOTAL, T3FREE, THYROIDAB in the last 72 hours.  Invalid input(s): FREET3 ------------------------------------------------------------------------------------------------------------------ Recent Labs    01/20/2019 1504  FERRITIN 832*    Coagulation profile Recent Labs  Lab 01/15/2019 1017  INR 1.1    No results for input(s): DDIMER in the last 72 hours.  Cardiac Enzymes No results for input(s): CKMB, TROPONINI, MYOGLOBIN in the last 168 hours.  Invalid input(s): CK ------------------------------------------------------------------------------------------------------------------ Invalid input(s): POCBNP

## 2018-12-31 NOTE — ED Notes (Addendum)
Date and time results received: 01/04/2019 1205   Test: COVID swab Critical Value: postive  Name of Provider Notified: Dr. Arsenio Katz Charge RN   Hepa filter placed in room since pt is not in negative pressure room.

## 2018-12-31 NOTE — Telephone Encounter (Signed)
Encounter created to obtain chart history, patient currently being admitted under a different MRN.  Please delete encounter.

## 2018-12-31 NOTE — Progress Notes (Signed)
Pharmacy Brief Note   O:  ALT: 13 CXR: Patchy fluffy and linear parahilar opacities throughout both lungs SpO2: on 10 L/min on NRB    A/P:  Patient meets requirements for remdesivir therapy.  Will start  remdesivir 200 mg IV x 1  followed by 100 mg IV daily x 4 days.  Monitor ALT  Royetta Asal, PharmD, BCPS 12/27/2018 11:41 PM

## 2019-01-01 ENCOUNTER — Inpatient Hospital Stay (HOSPITAL_COMMUNITY): Payer: Medicare Other

## 2019-01-01 ENCOUNTER — Inpatient Hospital Stay (HOSPITAL_COMMUNITY): Payer: Self-pay

## 2019-01-01 LAB — C-REACTIVE PROTEIN
CRP: 34.2 mg/dL — ABNORMAL HIGH (ref <1.0)
CRP: 33.3 mg/dL — ABNORMAL HIGH (ref <1.0)

## 2019-01-01 LAB — COMPREHENSIVE METABOLIC PANEL
ALT: 14 U/L (ref 0–44)
AST: 26 U/L (ref 15–41)
Albumin: 2.5 g/dL — ABNORMAL LOW (ref 3.5–5.0)
Alkaline Phosphatase: 57 U/L (ref 38–126)
Anion gap: 15 (ref 5–15)
BUN: 90 mg/dL — ABNORMAL HIGH (ref 8–23)
CO2: 20 mmol/L — ABNORMAL LOW (ref 22–32)
Calcium: 7.4 mg/dL — ABNORMAL LOW (ref 8.9–10.3)
Chloride: 108 mmol/L (ref 98–111)
Creatinine, Ser: 4.84 mg/dL — ABNORMAL HIGH (ref 0.44–1.00)
GFR calc Af Amer: 8 mL/min — ABNORMAL LOW (ref >60)
GFR calc non Af Amer: 7 mL/min — ABNORMAL LOW (ref >60)
Glucose, Bld: 125 mg/dL — ABNORMAL HIGH (ref 70–99)
Potassium: 4.6 mmol/L (ref 3.5–5.1)
Sodium: 143 mmol/L (ref 135–145)
Total Bilirubin: 0.8 mg/dL (ref 0.3–1.2)
Total Protein: 6.9 g/dL (ref 6.5–8.1)

## 2019-01-01 LAB — CBC WITH DIFFERENTIAL/PLATELET
Abs Immature Granulocytes: 0.05 10*3/uL (ref 0.00–0.07)
Basophils Absolute: 0 10*3/uL (ref 0.0–0.1)
Basophils Relative: 0 %
Eosinophils Absolute: 0 10*3/uL (ref 0.0–0.5)
Eosinophils Relative: 0 %
HCT: 31.4 % — ABNORMAL LOW (ref 36.0–46.0)
Hemoglobin: 9.7 g/dL — ABNORMAL LOW (ref 12.0–15.0)
Immature Granulocytes: 1 %
Lymphocytes Relative: 8 %
Lymphs Abs: 0.7 10*3/uL (ref 0.7–4.0)
MCH: 29.2 pg (ref 26.0–34.0)
MCHC: 30.9 g/dL (ref 30.0–36.0)
MCV: 94.6 fL (ref 80.0–100.0)
Monocytes Absolute: 0.1 10*3/uL (ref 0.1–1.0)
Monocytes Relative: 1 %
Neutro Abs: 8.6 10*3/uL — ABNORMAL HIGH (ref 1.7–7.7)
Neutrophils Relative %: 90 %
Platelets: 192 10*3/uL (ref 150–400)
RBC: 3.32 MIL/uL — ABNORMAL LOW (ref 3.87–5.11)
RDW: 15.2 % (ref 11.5–15.5)
WBC: 9.5 10*3/uL (ref 4.0–10.5)
nRBC: 0.5 % — ABNORMAL HIGH (ref 0.0–0.2)

## 2019-01-01 LAB — URINALYSIS, ROUTINE W REFLEX MICROSCOPIC
Bilirubin Urine: NEGATIVE
Glucose, UA: NEGATIVE mg/dL
Ketones, ur: NEGATIVE mg/dL
Nitrite: NEGATIVE
Protein, ur: NEGATIVE mg/dL
Specific Gravity, Urine: 1.013 (ref 1.005–1.030)
pH: 5 (ref 5.0–8.0)

## 2019-01-01 LAB — BASIC METABOLIC PANEL
Anion gap: 16 — ABNORMAL HIGH (ref 5–15)
BUN: 92 mg/dL — ABNORMAL HIGH (ref 8–23)
CO2: 20 mmol/L — ABNORMAL LOW (ref 22–32)
Calcium: 7.7 mg/dL — ABNORMAL LOW (ref 8.9–10.3)
Chloride: 109 mmol/L (ref 98–111)
Creatinine, Ser: 4.65 mg/dL — ABNORMAL HIGH (ref 0.44–1.00)
GFR calc Af Amer: 9 mL/min — ABNORMAL LOW (ref >60)
GFR calc non Af Amer: 8 mL/min — ABNORMAL LOW (ref >60)
Glucose, Bld: 112 mg/dL — ABNORMAL HIGH (ref 70–99)
Potassium: 5.2 mmol/L — ABNORMAL HIGH (ref 3.5–5.1)
Sodium: 145 mmol/L (ref 135–145)

## 2019-01-01 LAB — OSMOLALITY, URINE: Osmolality, Ur: 361 mOsm/kg (ref 300–900)

## 2019-01-01 LAB — TROPONIN I (HIGH SENSITIVITY)
Troponin I (High Sensitivity): 583 ng/L (ref <18)
Troponin I (High Sensitivity): 579 ng/L (ref <18)

## 2019-01-01 LAB — D-DIMER, QUANTITATIVE: D-Dimer, Quant: 11.35 ug/mL-FEU — ABNORMAL HIGH (ref 0.00–0.50)

## 2019-01-01 LAB — SODIUM, URINE, RANDOM: Sodium, Ur: 34 mmol/L

## 2019-01-01 LAB — ABO/RH: ABO/RH(D): O POS

## 2019-01-01 LAB — OSMOLALITY: Osmolality: 329 mOsm/kg (ref 275–295)

## 2019-01-01 LAB — CREATININE, URINE, RANDOM: Creatinine, Urine: 159.34 mg/dL

## 2019-01-01 MED ORDER — CALCIUM GLUCONATE-NACL 1-0.675 GM/50ML-% IV SOLN
1.0000 g | Freq: Once | INTRAVENOUS | Status: AC
  Administered 2019-01-01: 1000 mg via INTRAVENOUS
  Filled 2019-01-01: qty 50

## 2019-01-01 MED ORDER — FUROSEMIDE 10 MG/ML IJ SOLN
60.0000 mg | Freq: Once | INTRAMUSCULAR | Status: AC
  Administered 2019-01-01: 60 mg via INTRAVENOUS
  Filled 2019-01-01: qty 6

## 2019-01-01 NOTE — Progress Notes (Signed)
PROGRESS NOTE                                                                                                                                                                                                             Patient Demographics:    [name], is a 85 y.o. female, DOB - 05/19/1923, YVO:[id number]  Outpatient Primary MD for the patient is [name], MD    LOS - 1  Admit date - 12/30/2018    CC - SOB     Brief Narrative  [name] is a 85 y.o. female with medical history significant of HFrEF with 25-30% EF (though looks like last echo was in 2015), Tachy-brady syndrome s/p PPM which needs battery replacement in near future though they are trying to delay given patients age, CKD stage 4 with baseline creat ~2.0 last month. Patient presented to the ED at [facility] with worsening SOB and dark urine, she was in hypoxic respiratory failure requiring BiPAP/nonrebreather, also had acute renal failure with creatinine close to 6 with no urine output.  She also tested positive for COVID-19 and was transferred here.   Subjective:    [name] today has, No headache, No chest pain, No abdominal pain - No Nausea, No new weakness tingling or numbness, +ve Cough - SOB.     Assessment  & Plan :    Principal Problem:   Acute respiratory failure with hypoxia (HCC) Active Problems:   Acute respiratory disease due to COVID-19 virus   Acute kidney failure (HCC)   CKD (chronic kidney disease) stage 4, GFR 15-29 ml/min (HCC)   Acute on chronic systolic CHF (congestive heart failure) (HCC)   Acute metabolic encephalopathy   1. Acute Hypoxic Resp. Failure due to Acute Covid 19 Viral Pneumonitis during the ongoing 2020 Covid 19 Pandemic along with likely acute on chronic systolic heart failure with EF of 25 to 30% on last echocardiogram in 2015 -   For her COVID illness she is getting IV steroids and Remdisvir, monitor  inflammatory markers, supportive care with oxygen, flutter valve and proning, prognosis is guarded.  For her acute on chronic systolic CHF EF 25 to 86% she is getting IV Lasix as tolerated, low-dose beta-blocker and monitor.  Not a candidate for ACE/ARB due to renal insufficiency.  Again prognosis is extremely poor.  ABG  No results found for: PHART, PCO2ART, PO2ART, HCO3, TCO2, ACIDBASEDEF, O2SAT  COVID-19 Labs  Recent Labs    12/30/2018 0030 01/20/2019 1504 01/01/19 0030  DDIMER  --   --  11.35*  FERRITIN  --  832*  --   CRP 34.2* 33.3*  --     Lab Results  Component Value Date   SARSCOV2NAA POSITIVE (A) 01/14/2019     Hepatic Function Latest Ref Rng & Units 01/01/2019 12/29/2018  Total Protein 6.5 - 8.1 g/dL 6.9 6.9  Albumin 3.5 - 5.0 g/dL 2.5(L) 2.8(L)  AST 15 - 41 U/L 26 30  ALT 0 - 44 U/L 14 13  Alk Phosphatase 38 - 126 U/L 57 67  Total Bilirubin 0.3 - 1.2 mg/dL 0.8 1.2        Component Value Date/Time   BNP 1,587.0 (H) 01/22/2019 1017      2.  Acute on chronic systolic heart failure EF 25 to 30% in 2015.  See #1 above.  Echo pending.  3.  ARF on CKD 4 baseline creatinine around 2.2.  Currently clinical signs of some CHF decompensation IV Lasix Foley and monitor.  UA along with urine electrolytes ordered.  4.  History of tachybradycardia syndrome with pacemaker.  Battery due to be changed, per family cardiologists are delaying the battery change due to her advanced age and poor baseline condition and life expectancy.  5.  Hypertension currently on Coreg Norvasc and diuretic will monitor.  6.  Acute metabolic and toxic encephalopathy.  Supportive care.  7.  History of COPD.  Currently stable.     Condition - Extremely Guarded  Family Communication  : grand daughter in detail 01/01/19  Code Status :  DNR  Diet :    Diet Order            DIET SOFT Room service appropriate? Yes; Fluid consistency: Thin  Diet effective now               Disposition  Plan  : SNF  Consults  :  None  Procedures  :     Echocardiogram.  PUD Prophylaxis :    DVT Prophylaxis  :    Heparin    Lab Results  Component Value Date   PLT 192 01/01/2019    Inpatient Medications  Scheduled Meds: . carvedilol  6.25 mg Oral BID WC  . furosemide  60 mg Intravenous Once  . heparin injection (subcutaneous)  7,500 Units Subcutaneous Q8H  . methylPREDNISolone (SOLU-MEDROL) injection  40 mg Intravenous Q12H  . mometasone-formoterol  2 puff Inhalation BID  . umeclidinium bromide  1 puff Inhalation Daily   Continuous Infusions: . remdesivir 100 mg in NS 250 mL     PRN Meds:.acetaminophen, albuterol, ondansetron **OR** ondansetron (ZOFRAN) IV  Antibiotics  :    Anti-infectives (From admission, onward)   Start     Dose/Rate Route Frequency Ordered Stop   01/01/19 2200  remdesivir 100 mg in sodium chloride 0.9 % 250 mL IVPB     100 mg 500 mL/hr over 30 Minutes Intravenous Every 24 hours 01/12/2019 2259 01/05/19 2159   01/21/2019 2330  remdesivir 200 mg in sodium chloride 0.9 % 250 mL IVPB     200 mg 500 mL/hr over 30 Minutes Intravenous Once 01/14/2019 2259 01/01/19 0043       Time Spent in minutes  30   Lala Lund M.D on 01/01/2019 at 10:22 AM  To page go to [email or web address] - password [facility]  [facility] -  Office  [phone]     See all Orders from today for further details    Objective:   Vitals:   01/01/19 0807  BP: 117/62  Pulse: 65  Resp: 18  Temp: (!) 96.3 F (35.7 C)  TempSrc: Axillary  SpO2: 96%    Wt Readings from Last 3 Encounters:  01/12/2019 86.6 kg     Intake/Output Summary (Last 24 hours) at 01/01/2019 1022 Last data filed at 01/01/2019 0900 Gross per 24 hour  Intake 310 ml  Output 400 ml  Net -90 ml     Physical Exam  Awake , mildly confudsed, No new F.N deficits, Normal affect [place].AT,PERRAL Supple Neck,No JVD, No cervical lymphadenopathy appriciated.  Symmetrical Chest wall movement, Good air movement  bilaterally, +ve rales RRR,No Gallops,Rubs or new Murmurs, No Parasternal Heave +ve B.Sounds, Abd Soft, No tenderness, No organomegaly appriciated, No rebound - guarding or rigidity. No Cyanosis, Clubbing or edema, No new Rash or bruise       Data Review:    CBC Recent Labs  Lab 12/30/2018 1017 01/01/19 0030  WBC 9.5 9.5  HGB 10.2* 9.7*  HCT 31.7* 31.4*  PLT 192 192  MCV 91.1 94.6  MCH 29.3 29.2  MCHC 32.2 30.9  RDW 15.2 15.2  LYMPHSABS 1.4 0.7  MONOABS 0.4 0.1  EOSABS 0.0 0.0  BASOSABS 0.0 0.0    Chemistries  Recent Labs  Lab 01/08/2019 1017 01/01/19 0030 01/01/19 0830  NA 140 143 145  K 5.0 4.6 5.2*  CL 106 108 109  CO2 18* 20* 20*  GLUCOSE 106* 125* 112*  BUN 84* 90* 92*  CREATININE 5.10* 4.84* 4.65*  CALCIUM 7.0* 7.4* 7.7*  AST 30 26  --   ALT 13 14  --   ALKPHOS 67 57  --   BILITOT 1.2 0.8  --    ------------------------------------------------------------------------------------------------------------------ No results for input(s): CHOL, HDL, LDLCALC, TRIG, CHOLHDL, LDLDIRECT in the last 72 hours.  No results found for: HGBA1C ------------------------------------------------------------------------------------------------------------------ No results for input(s): TSH, T4TOTAL, T3FREE, THYROIDAB in the last 72 hours.  Invalid input(s): FREET3  Cardiac Enzymes No results for input(s): CKMB, TROPONINI, MYOGLOBIN in the last 168 hours.  Invalid input(s): CK ------------------------------------------------------------------------------------------------------------------    Component Value Date/Time   BNP 1,587.0 (H) 01/08/2019 1017    Micro Results Recent Results (from the past 240 hour(s))  Culture, blood (Routine x 2)     Status: None (Preliminary result)   Collection Time: 01/21/2019 10:17 AM   Specimen: BLOOD  Result Value Ref Range Status   Specimen Description BLOOD LEFT ANTECUBITAL  Final   Special Requests   Final    BOTTLES DRAWN  AEROBIC AND ANAEROBIC Blood Culture adequate volume   Culture   Final    NO GROWTH < 24 HOURS Performed at [facility], [address] [place], [place] [address]    Report Status PENDING  Incomplete  Culture, blood (Routine x 2)     Status: None (Preliminary result)   Collection Time: 12/26/2018 10:28 AM   Specimen: BLOOD  Result Value Ref Range Status   Specimen Description BLOOD R HAND  Final   Special Requests   Final    BOTTLES DRAWN AEROBIC AND ANAEROBIC Blood Culture adequate volume   Culture   Final    NO GROWTH < 24 HOURS Performed at [facility], [address], [place],  [address]    Report Status PENDING  Incomplete  SARS Coronavirus 2 [facility] order,  Performed in [facility] hospital lab) Nasopharyngeal Nasopharyngeal Swab     Status: Abnormal   Collection Time: 12/30/2018 10:42 AM   Specimen: Nasopharyngeal Swab  Result Value Ref Range Status   SARS Coronavirus 2 POSITIVE (A) NEGATIVE Final    Comment: RESULT CALLED TO, READ BACK BY AND VERIFIED WITH:  Nadyne Coombes AT 1962 01/11/2019 SDR (NOTE) If result is NEGATIVE SARS-CoV-2 target nucleic acids are NOT DETECTED. The SARS-CoV-2 RNA is generally detectable in upper and lower  respiratory specimens during the acute phase of infection. The lowest  concentration of SARS-CoV-2 viral copies this assay can detect is 250  copies / mL. A negative result does not preclude SARS-CoV-2 infection  and should not be used as the sole basis for treatment or other  patient management decisions.  A negative result may occur with  improper specimen collection / handling, submission of specimen other  than nasopharyngeal swab, presence of viral mutation(s) within the  areas targeted by this assay, and inadequate number of viral copies  (<250 copies / mL). A negative result must be combined with clinical  observations, patient history, and epidemiological information. If result is POSITIVE SARS-CoV-2 target  nucleic acids are DETECTED.  The SARS-CoV-2 RNA is generally detectable in upper and lower  respiratory specimens during the acute phase of infection.  Positive  results are indicative of active infection with SARS-CoV-2.  Clinical  correlation with patient history and other diagnostic information is  necessary to determine patient infection status.  Positive results do  not rule out bacterial infection or co-infection with other viruses. If result is PRESUMPTIVE POSTIVE SARS-CoV-2 nucleic acids MAY BE PRESENT.   A presumptive positive result was obtained on the submitted specimen  and confirmed on repeat testing.  While 2019 novel coronavirus  (SARS-CoV-2) nucleic acids may be present in the submitted sample  additional confirmatory testing may be necessary for epidemiological  and / or clinical management purposes  to differentiate between  SARS-CoV-2 and other Sarbecovirus currently known to infect humans.  If clinically indicated additional testing with an alternate test  methodology [phone]) i s advised. The SARS-CoV-2 RNA is generally  detectable in upper and lower respiratory specimens during the acute  phase of infection. The expected result is Negative. Fact Sheet for Patients:  StrictlyIdeas.no Fact Sheet for Healthcare Providers: BankingDealers.co.za This test is not yet approved or cleared by the [place] FDA and has been authorized for detection and/or diagnosis of SARS-CoV-2 by FDA under an Emergency Use Authorization (EUA).  This EUA will remain in effect (meaning this test can be used) for the duration of the COVID-19 declaration under Section 564(b)(1) of the Act, 21 U.S.C. section 360bbb-3(b)(1), unless the authorization is terminated or revoked sooner. Performed at [facility], [address], [place], [place] [address]     Radiology Reports Dg Chest Portable 1 View  Result Date:  12/24/2018 CLINICAL DATA:  Cough EXAM: PORTABLE CHEST 1 VIEW COMPARISON:  11/24/2017 chest radiograph. FINDINGS: Stable configuration of 3 lead right subclavian pacemaker. Stable cardiomediastinal silhouette with moderate cardiomegaly. No pneumothorax. No pleural effusion. There is patchy parahilar fluffy and linear opacities throughout both lungs. IMPRESSION: Patchy fluffy and linear parahilar opacities throughout both lungs with moderate cardiomegaly, favor cardiogenic pulmonary edema. Electronically Signed   By: Ilona Sorrel M.D.   On: 01/05/2019 11:05

## 2019-01-01 NOTE — Progress Notes (Signed)
Patient has not had any urinary output, evening after administering 80mg  IV Lasix at 0010. Dr. Alcario Drought notified. No new orders were received at this time. Will continue to monitor.

## 2019-01-01 NOTE — Progress Notes (Signed)
01/01/2019  0753  Spoke with RT Alyse Low) I regards to getting a mask chamber to use with inhaler for patient. Patient not able to understand how to use the inhaler. Per Alyse Low she will check to see if the mask chamber is available here at [facility]. Waiting for equipment.

## 2019-01-01 NOTE — Progress Notes (Signed)
CRITICAL VALUE STICKER  CRITICAL VALUE: Osmolality   DATE & TIME NOTIFIED: 01/01/19 1639   MD NOTIFIED: MD Candiss Norse

## 2019-01-01 NOTE — Plan of Care (Signed)
  Problem: Respiratory: Goal: Will maintain a patent airway Outcome: Progressing   

## 2019-01-02 LAB — CBC WITH DIFFERENTIAL/PLATELET
Abs Immature Granulocytes: 0.07 10*3/uL (ref 0.00–0.07)
Basophils Absolute: 0 10*3/uL (ref 0.0–0.1)
Basophils Relative: 0 %
Eosinophils Absolute: 0 10*3/uL (ref 0.0–0.5)
Eosinophils Relative: 0 %
HCT: 28.3 % — ABNORMAL LOW (ref 36.0–46.0)
Hemoglobin: 8.8 g/dL — ABNORMAL LOW (ref 12.0–15.0)
Immature Granulocytes: 1 %
Lymphocytes Relative: 6 %
Lymphs Abs: 0.7 10*3/uL (ref 0.7–4.0)
MCH: 29 pg (ref 26.0–34.0)
MCHC: 31.1 g/dL (ref 30.0–36.0)
MCV: 93.4 fL (ref 80.0–100.0)
Monocytes Absolute: 0.2 10*3/uL (ref 0.1–1.0)
Monocytes Relative: 2 %
Neutro Abs: 10.7 10*3/uL — ABNORMAL HIGH (ref 1.7–7.7)
Neutrophils Relative %: 91 %
Platelets: 180 10*3/uL (ref 150–400)
RBC: 3.03 MIL/uL — ABNORMAL LOW (ref 3.87–5.11)
RDW: 15.3 % (ref 11.5–15.5)
WBC: 11.7 10*3/uL — ABNORMAL HIGH (ref 4.0–10.5)
nRBC: 1 % — ABNORMAL HIGH (ref 0.0–0.2)

## 2019-01-02 LAB — COMPREHENSIVE METABOLIC PANEL
ALT: 13 U/L (ref 0–44)
AST: 21 U/L (ref 15–41)
Albumin: 2.2 g/dL — ABNORMAL LOW (ref 3.5–5.0)
Alkaline Phosphatase: 62 U/L (ref 38–126)
Anion gap: 12 (ref 5–15)
BUN: 98 mg/dL — ABNORMAL HIGH (ref 8–23)
CO2: 23 mmol/L (ref 22–32)
Calcium: 7.3 mg/dL — ABNORMAL LOW (ref 8.9–10.3)
Chloride: 106 mmol/L (ref 98–111)
Creatinine, Ser: 4.29 mg/dL — ABNORMAL HIGH (ref 0.44–1.00)
GFR calc Af Amer: 10 mL/min — ABNORMAL LOW (ref >60)
GFR calc non Af Amer: 8 mL/min — ABNORMAL LOW (ref >60)
Glucose, Bld: 133 mg/dL — ABNORMAL HIGH (ref 70–99)
Potassium: 4.5 mmol/L (ref 3.5–5.1)
Sodium: 141 mmol/L (ref 135–145)
Total Bilirubin: 0.2 mg/dL — ABNORMAL LOW (ref 0.3–1.2)
Total Protein: 6.1 g/dL — ABNORMAL LOW (ref 6.5–8.1)

## 2019-01-02 LAB — D-DIMER, QUANTITATIVE: D-Dimer, Quant: 7.65 ug/mL-FEU — ABNORMAL HIGH (ref 0.00–0.50)

## 2019-01-02 LAB — MAGNESIUM: Magnesium: 1.9 mg/dL (ref 1.7–2.4)

## 2019-01-02 LAB — C-REACTIVE PROTEIN: CRP: 22.9 mg/dL — ABNORMAL HIGH (ref <1.0)

## 2019-01-02 MED ORDER — MAGNESIUM SULFATE IN D5W 1-5 GM/100ML-% IV SOLN
1.0000 g | Freq: Once | INTRAVENOUS | Status: AC
  Administered 2019-01-02: 1 g via INTRAVENOUS
  Filled 2019-01-02: qty 100

## 2019-01-02 MED ORDER — CARVEDILOL 3.125 MG PO TABS
3.1250 mg | ORAL_TABLET | Freq: Two times a day (BID) | ORAL | Status: DC
  Administered 2019-01-03: 3.125 mg via ORAL
  Filled 2019-01-02: qty 1

## 2019-01-02 MED ORDER — FUROSEMIDE 10 MG/ML IJ SOLN
40.0000 mg | Freq: Every day | INTRAMUSCULAR | Status: DC
  Administered 2019-01-02 – 2019-01-03 (×2): 40 mg via INTRAVENOUS
  Filled 2019-01-02 (×2): qty 4

## 2019-01-02 MED ORDER — SODIUM CHLORIDE 0.9 % IV BOLUS
250.0000 mL | Freq: Once | INTRAVENOUS | Status: AC
  Administered 2019-01-02: 250 mL via INTRAVENOUS

## 2019-01-02 MED ORDER — CARVEDILOL 3.125 MG PO TABS: 3.1250 mg | ORAL_TABLET | Freq: Two times a day (BID) | ORAL | Status: DC

## 2019-01-02 MED ORDER — METOPROLOL TARTRATE 5 MG/5ML IV SOLN
5.0000 mg | Freq: Four times a day (QID) | INTRAVENOUS | Status: DC | PRN
  Filled 2019-01-02: qty 5

## 2019-01-02 NOTE — Progress Notes (Signed)
PROGRESS NOTE                                                                                                                                                                                                             Patient Demographics:    [name], is a 85 y.o. female, DOB - [birth year], ELF:[id number]  Outpatient Primary MD for the patient is [name], MD    LOS - 2  Admit date - 01/06/2019    CC - SOB     Brief Narrative  [name] is a 85 y.o. female with medical history significant of HFrEF with 25-30% EF (though looks like last echo was in 2015), Tachy-brady syndrome s/p PPM which needs battery replacement in near future though they are trying to delay given patients age, CKD stage 4 with baseline creat ~2.0 last month. Patient presented to the ED at [facility] with worsening SOB and dark urine, she was in hypoxic respiratory failure requiring BiPAP/nonrebreather, also had acute renal failure with creatinine close to 6 with no urine output.  She also tested positive for COVID-19 and was transferred here.   Subjective:   Patient in bed, appears comfortable, denies any headache, no fever, no chest pain or pressure, no shortness of breath , no abdominal pain. No focal weakness.   Assessment  & Plan :     1. Acute Hypoxic Resp. Failure due to Acute Covid 19 Viral Pneumonitis during the ongoing 2020 Covid 19 Pandemic along with likely acute on chronic systolic heart failure with EF of 25 to 30% on last echocardiogram in 2015 -   For her COVID illness she is getting IV steroids and Remdisvir, monitor inflammatory markers, supportive care with oxygen, flutter valve and proning, prognosis is guarded.  For her acute on chronic systolic CHF EF 25 to 58% she is getting IV Lasix as tolerated, low-dose beta-blocker and monitor.  Not a candidate for ACE/ARB due to renal insufficiency.  Again prognosis is extremely  poor.   COVID-19 Labs  Recent Labs    01/10/2019 0030 12/25/2018 1504 01/01/19 0030 01/02/19 0215  DDIMER  --   --  11.35* 7.65*  FERRITIN  --  832*  --   --   CRP 34.2* 33.3*  --  22.9*    Lab Results  Component Value Date  SARSCOV2NAA POSITIVE (A) 01/16/2019     Hepatic Function Latest Ref Rng & Units 01/02/2019 01/01/2019 01/11/2019  Total Protein 6.5 - 8.1 g/dL 6.1(L) 6.9 6.9  Albumin 3.5 - 5.0 g/dL 2.2(L) 2.5(L) 2.8(L)  AST 15 - 41 U/L 21 26 30   ALT 0 - 44 U/L 13 14 13   Alk Phosphatase 38 - 126 U/L 62 57 67  Total Bilirubin 0.3 - 1.2 mg/dL 0.2(L) 0.8 1.2        Component Value Date/Time   BNP 1,587.0 (H) 12/28/2018 1017      2.  Acute on chronic systolic heart failure EF 25 to 30% in 2015.  See #1 above.  Echo pending.  3.  ARF on CKD 4 baseline creatinine around 2.2.  Currently clinical signs of some CHF decompensation IV Lasix Foley and monitor.  UA was inconclusive, urine electrolytes were also not interpretable as she had received multiple doses of Lasix prior.  Renal function is actually improving with diuresis which will be continued if tolerated by blood pressure.  4.  History of tachybradycardia syndrome with pacemaker.  Battery due to be changed, per family cardiologists are delaying the battery change due to her advanced age and poor baseline condition and life expectancy.  5.  Hypertension- blood pressure soft Coreg held.  6.  Acute metabolic and toxic encephalopathy.  Improved with supportive care now following all commands.  Commence PT.  7.  History of COPD.  Currently stable.   Condition - Extremely Guarded  Family Communication  : grand daughter in detail 01/01/19  Code Status :  DNR  Diet :    Diet Order            DIET SOFT Room service appropriate? Yes; Fluid consistency: Thin  Diet effective now               Disposition Plan  : SNF  Consults  :  None  Procedures  :     Echocardiogram.  PUD Prophylaxis :    DVT Prophylaxis  :     Heparin    Lab Results  Component Value Date   PLT 180 01/02/2019    Inpatient Medications  Scheduled Meds: . [START ON 01/03/2019] carvedilol  3.125 mg Oral BID WC  . furosemide  40 mg Intravenous Daily  . heparin injection (subcutaneous)  7,500 Units Subcutaneous Q8H  . methylPREDNISolone (SOLU-MEDROL) injection  40 mg Intravenous Q12H  . mometasone-formoterol  2 puff Inhalation BID  . umeclidinium bromide  1 puff Inhalation Daily   Continuous Infusions: . remdesivir 100 mg in NS 250 mL 100 mg (01/01/19 2117)   PRN Meds:.acetaminophen, albuterol, [DISCONTINUED] ondansetron **OR** ondansetron (ZOFRAN) IV  Antibiotics  :    Anti-infectives (From admission, onward)   Start     Dose/Rate Route Frequency Ordered Stop   01/01/19 2200  remdesivir 100 mg in sodium chloride 0.9 % 250 mL IVPB     100 mg 500 mL/hr over 30 Minutes Intravenous Every 24 hours 12/30/2018 2259 01/05/19 2159   01/14/2019 2330  remdesivir 200 mg in sodium chloride 0.9 % 250 mL IVPB     200 mg 500 mL/hr over 30 Minutes Intravenous Once 01/16/2019 2259 01/01/19 0043       Time Spent in minutes  30   Lala Lund M.D on 01/02/2019 at 9:12 AM  To page go to [email or web address] - password [email or web address]  [facility] -  Office  [phone]     See all Orders from today for  further details    Objective:   Vitals:   01/01/19 1825 01/01/19 2000 01/02/19 0400 01/02/19 0800  BP: 99/70 (!) 89/57 (!) 85/50 (!) 100/55  Pulse: 92 85 90 (!) 49  Resp: 20 20 19  (!) 23  Temp:  97.6 F (36.4 C) 98 F (36.7 C) 98 F (36.7 C)  TempSrc:  Axillary Axillary   SpO2: 93% 97% 100% 99%    Wt Readings from Last 3 Encounters:  12/24/2018 86.6 kg     Intake/Output Summary (Last 24 hours) at 01/02/2019 0912 Last data filed at 01/01/2019 1831 Gross per 24 hour  Intake 720 ml  Output 150 ml  Net 570 ml     Physical Exam  Awake Alert,  No new F.N deficits, Normal affect [place].AT,PERRAL Supple Neck,No JVD, No cervical  lymphadenopathy appriciated.  Symmetrical Chest wall movement, Good air movement bilaterally, few rales RRR,No Gallops, Rubs or new Murmurs, No Parasternal Heave +ve B.Sounds, Abd Soft, No tenderness, No organomegaly appriciated, No rebound - guarding or rigidity. No Cyanosis, Clubbing or edema, No new Rash or bruise    Data Review:    CBC Recent Labs  Lab 01/04/2019 1017 01/01/19 0030 01/02/19 0215  WBC 9.5 9.5 11.7*  HGB 10.2* 9.7* 8.8*  HCT 31.7* 31.4* 28.3*  PLT 192 192 180  MCV 91.1 94.6 93.4  MCH 29.3 29.2 29.0  MCHC 32.2 30.9 31.1  RDW 15.2 15.2 15.3  LYMPHSABS 1.4 0.7 0.7  MONOABS 0.4 0.1 0.2  EOSABS 0.0 0.0 0.0  BASOSABS 0.0 0.0 0.0    Chemistries  Recent Labs  Lab 01/11/2019 1017 01/01/19 0030 01/01/19 0830 01/02/19 0215  NA 140 143 145 141  K 5.0 4.6 5.2* 4.5  CL 106 108 109 106  CO2 18* 20* 20* 23  GLUCOSE 106* 125* 112* 133*  BUN 84* 90* 92* 98*  CREATININE 5.10* 4.84* 4.65* 4.29*  CALCIUM 7.0* 7.4* 7.7* 7.3*  MG  --   --   --  1.9  AST 30 26  --  21  ALT 13 14  --  13  ALKPHOS 67 57  --  62  BILITOT 1.2 0.8  --  0.2*   ------------------------------------------------------------------------------------------------------------------ No results for input(s): CHOL, HDL, LDLCALC, TRIG, CHOLHDL, LDLDIRECT in the last 72 hours.  No results found for: HGBA1C ------------------------------------------------------------------------------------------------------------------ No results for input(s): TSH, T4TOTAL, T3FREE, THYROIDAB in the last 72 hours.  Invalid input(s): FREET3  Cardiac Enzymes No results for input(s): CKMB, TROPONINI, MYOGLOBIN in the last 168 hours.  Invalid input(s): CK ------------------------------------------------------------------------------------------------------------------    Component Value Date/Time   BNP 1,587.0 (H) 12/27/2018 1017    Micro Results Recent Results (from the past 240 hour(s))  Culture, blood  (Routine x 2)     Status: None (Preliminary result)   Collection Time: 01/18/2019 10:17 AM   Specimen: BLOOD  Result Value Ref Range Status   Specimen Description BLOOD LEFT ANTECUBITAL  Final   Special Requests   Final    BOTTLES DRAWN AEROBIC AND ANAEROBIC Blood Culture adequate volume   Culture   Final    NO GROWTH 2 DAYS Performed at [facility], [address] [place], [place] [address]    Report Status PENDING  Incomplete  Culture, blood (Routine x 2)     Status: None (Preliminary result)   Collection Time: 01/12/2019 10:28 AM   Specimen: BLOOD  Result Value Ref Range Status   Specimen Description BLOOD R HAND  Final   Special Requests  Final    BOTTLES DRAWN AEROBIC AND ANAEROBIC Blood Culture adequate volume   Culture   Final    NO GROWTH 2 DAYS Performed at [facility], [address] [place], [place] [address]    Report Status PENDING  Incomplete  SARS Coronavirus 2 [facility] order, Performed in [facility] hospital lab) Nasopharyngeal Nasopharyngeal Swab     Status: Abnormal   Collection Time: 01/07/2019 10:42 AM   Specimen: Nasopharyngeal Swab  Result Value Ref Range Status   SARS Coronavirus 2 POSITIVE (A) NEGATIVE Final    Comment: RESULT CALLED TO, READ BACK BY AND VERIFIED WITH:  Nadyne Coombes AT 1962 01/03/2019 SDR (NOTE) If result is NEGATIVE SARS-CoV-2 target nucleic acids are NOT DETECTED. The SARS-CoV-2 RNA is generally detectable in upper and lower  respiratory specimens during the acute phase of infection. The lowest  concentration of SARS-CoV-2 viral copies this assay can detect is 250  copies / mL. A negative result does not preclude SARS-CoV-2 infection  and should not be used as the sole basis for treatment or other  patient management decisions.  A negative result may occur with  improper specimen collection / handling, submission of specimen other  than nasopharyngeal swab, presence of viral mutation(s) within the  areas  targeted by this assay, and inadequate number of viral copies  (<250 copies / mL). A negative result must be combined with clinical  observations, patient history, and epidemiological information. If result is POSITIVE SARS-CoV-2 target nucleic acids are DETECTED.  The SARS-CoV-2 RNA is generally detectable in upper and lower  respiratory specimens during the acute phase of infection.  Positive  results are indicative of active infection with SARS-CoV-2.  Clinical  correlation with patient history and other diagnostic information is  necessary to determine patient infection status.  Positive results do  not rule out bacterial infection or co-infection with other viruses. If result is PRESUMPTIVE POSTIVE SARS-CoV-2 nucleic acids MAY BE PRESENT.   A presumptive positive result was obtained on the submitted specimen  and confirmed on repeat testing.  While 2019 novel coronavirus  (SARS-CoV-2) nucleic acids may be present in the submitted sample  additional confirmatory testing may be necessary for epidemiological  and / or clinical management purposes  to differentiate between  SARS-CoV-2 and other Sarbecovirus currently known to infect humans.  If clinically indicated additional testing with an alternate test  methodology [phone]) i s advised. The SARS-CoV-2 RNA is generally  detectable in upper and lower respiratory specimens during the acute  phase of infection. The expected result is Negative. Fact Sheet for Patients:  StrictlyIdeas.no Fact Sheet for Healthcare Providers: BankingDealers.co.za This test is not yet approved or cleared by the [place] FDA and has been authorized for detection and/or diagnosis of SARS-CoV-2 by FDA under an Emergency Use Authorization (EUA).  This EUA will remain in effect (meaning this test can be used) for the duration of the COVID-19 declaration under Section 564(b)(1) of the Act, 21 U.S.C. section  360bbb-3(b)(1), unless the authorization is terminated or revoked sooner. Performed at [facility], [address], [place], [place] [address]     Radiology Reports US Renal  Result Date: 01/01/2019 CLINICAL DATA:  Initial evaluation for acute renal failure. EXAM: RENAL / URINARY TRACT ULTRASOUND COMPLETE COMPARISON:  Prior ultrasound from 12/01/2015 FINDINGS: Right Kidney: Renal measurements: 9.4 x 4.4 x 4.6 cm = volume: 99.8 mL. Diffusely increased echogenicity within the renal parenchyma, compatible with medical renal disease. Underlying mild diffuse cortical thinning.  No hydronephrosis. 3.2 x 2.9 x 2.1 cm minimally complex cyst present at the interpolar region. Additional 1.7 x 1.4 x 1.2 cm minimally complex cyst at the lower pole. Left Kidney: Renal measurements: 9.5 x 5.6 x 5.7 cm = volume: 156.8 mL. Diffusely increased echogenicity within the renal parenchyma, compatible with medical renal disease. Underlying mild diffuse cortical thinning. No hydronephrosis. No focal renal lesion. Bladder: Decompressed with a Foley catheter in place. IMPRESSION: 1. Diffusely increased echogenicity within the renal parenchyma, suggesting medical renal disease. 2. No hydronephrosis. 3. Right renal cysts measuring up to 3.2 cm as above. Electronically Signed   By: Jeannine Boga M.D.   On: 01/01/2019 19:42   Dg Chest Portable 1 View  Result Date: 12/25/2018 CLINICAL DATA:  Cough EXAM: PORTABLE CHEST 1 VIEW COMPARISON:  11/24/2017 chest radiograph. FINDINGS: Stable configuration of 3 lead right subclavian pacemaker. Stable cardiomediastinal silhouette with moderate cardiomegaly. No pneumothorax. No pleural effusion. There is patchy parahilar fluffy and linear opacities throughout both lungs. IMPRESSION: Patchy fluffy and linear parahilar opacities throughout both lungs with moderate cardiomegaly, favor cardiogenic pulmonary edema. Electronically Signed   By: Ilona Sorrel M.D.   On: 01/18/2019 11:05

## 2019-01-02 NOTE — Evaluation (Signed)
Occupational Therapy Evaluation Patient Details Name: [name] MRN: [id number] DOB: 1922-09-26 Today's Date: 01/02/2019    History of Present Illness 85 yo female presents to the ED at [facility] with worsening SOB and dark urine. Per EMS Satting low 80s initially, low 90s on NRB in ED. PMH including HFrEF with 25-30% EF, Tachy-brady syndrome s/p PPM, CKD stage 4, COPD, HTN, and arthritis.    Clinical Impression   PTA, pt was living with her granddaughter and family supervised ADLs for safety; pt using RW for functional mobility. Pt currently requiring Mod A for UB ADLs, Max A for LB ADLs, and Mod A +2 for mobility. Pt performing bed mobility bed mobility with Mod A to EOB. While sitting at EOB, pt swaying and "dancing" to music with [place] A. Pt performing stand pivot to recliner with Mod A +2 and RW. HR elevated to 141 and SpO2 fluctuating between 80s-90s with poor pleth. BP dropping during mobility; notified RN. Pt presenting with decreased strength, balance, and activity tolerance. Pt would benefit from further acute OT to facilitate safe dc. Pending pt progress, recommend dc to SNF for further OT to optimize safety, independence with ADLs, and return to PLOF.   BP: sitting at EOB 109/83 After transfer to recliner 96/59  Elevating legs in recliner 81/50 and 84/50 Elevating legs and reclining back 102/74.      Follow Up Recommendations  SNF;Supervision/Assistance - 24 hour(Pending progress; family wants home)    Equipment Recommendations  Other (comment)(Defer to next venue)    Recommendations for Other Services PT consult     Precautions / Restrictions Precautions Precautions: Fall Precaution Comments: orthostatic, Incr. HR, Restrictions Weight Bearing Restrictions: No      Mobility Bed Mobility Overal bed mobility: Needs Assistance Bed Mobility: Rolling;Sidelying to Sit Rolling: Mod assist Sidelying to sit: Mod assist       General bed mobility comments: assist  with legs and trunk  Transfers Overall transfer level: Needs assistance Equipment used: Rolling walker (2 wheeled) Transfers: Sit to/from [facility] Sit to Stand: Mod assist;+2 physical assistance;+2 safety/equipment Stand pivot transfers: Mod assist;+2 physical assistance;+2 safety/equipment       General transfer comment: assist to rise and shuffle steps to recliner. pat's HR up to 141,Sao2 not picking up well, in 90's on HFNC 15 L.    Balance Overall balance assessment: Needs assistance;History of Falls Sitting-balance support: Feet supported;Bilateral upper extremity supported Sitting balance-Leahy Scale: Fair     Standing balance support: During functional activity;Bilateral upper extremity supported Standing balance-Leahy Scale: Poor Standing balance comment: reliant on UE                           ADL either performed or assessed with clinical judgement   ADL Overall ADL's : Needs assistance/impaired     Grooming: Wash/dry face;Min guard;Bed level Grooming Details (indicate cue type and reason): Pt washing her face while supine in bed with increased cues for initiation Upper Body Bathing: Moderate assistance;Sitting   Lower Body Bathing: Maximal assistance;+2 for physical assistance;Sit to/from stand   Upper Body Dressing : Moderate assistance;Sitting   Lower Body Dressing: Maximal assistance;Sit to/from stand   Toilet Transfer: Moderate assistance;+2 for physical assistance;+2 for safety/equipment;Stand-pivot;RW(Simulated to recliner)   Toileting- Clothing Manipulation and Hygiene: Total assistance       Functional mobility during ADLs: Moderate assistance;+2 for physical assistance;+2 for safety/equipment;Rolling walker(stand pivot only) General ADL Comments: Pt presenting with decreased strength, balance,  and activity tolerance. Hypotensive with BP dropping; see general comments     Vision Baseline Vision/History:  Glaucoma(Glaucoma of both eyes)       Perception     Praxis      Pertinent Vitals/Pain Pain Assessment: No/denies pain     Hand Dominance Right   Extremity/Trunk Assessment Upper Extremity Assessment Upper Extremity Assessment: Defer to OT evaluation   Lower Extremity Assessment Lower Extremity Assessment: Defer to PT evaluation   Cervical / Trunk Assessment Cervical / Trunk Assessment: Normal   Communication Communication Communication: HOH   Cognition Arousal/Alertness: Awake/alert Behavior During Therapy: WFL for tasks assessed/performed Overall Cognitive Status: Difficult to assess Area of Impairment: Attention;Following commands                 Orientation Level: Time;Place Current Attention Level: Sustained;Focused   Following Commands: Follows one step commands with increased time       General Comments: oriented to granddaughter, followed commands with increased  time. Enjoying music [employer]); swaying her head and tapping her hand   General Comments  With activity: SpO2 >85% on 15L O2 via HFNC and HR 141. BP sitting at 109/83; after transfer to recliner 96/59; elevating legs in recliner 81/50 and 84/50; elevating legs and reclining back 102/74. Notified RN    Exercises     Shoulder Instructions      Home Living Family/patient expects to be discharged to:: Private residence Living Arrangements: Non-relatives/Friends(Granddaughter) Available Help at Discharge: Family;Available 24 hours/day(Family takes turns being at home with her) Type of Home: Apartment Home Access: Level entry     Home Layout: One level     Bathroom Shower/Tub: Occupational psychologist: Standard     Home Equipment: Clinical cytogeneticist - 2 wheels;Walker - 4 wheels;Grab bars - tub/shower          Prior Functioning/Environment Level of Independence: Needs assistance  Gait / Transfers Assistance Needed: Used RW ADL's / Homemaking Assistance  Needed: Family assists with ADLs for safety            OT Problem List: Decreased strength;Decreased range of motion;Decreased activity tolerance;Impaired balance (sitting and/or standing);Decreased cognition;Decreased safety awareness;Decreased knowledge of precautions;Decreased knowledge of use of DME or AE;Obesity;Cardiopulmonary status limiting activity      OT Treatment/Interventions: Self-care/ADL training;Therapeutic exercise;Energy conservation;DME and/or AE instruction;Therapeutic activities;Patient/family education    OT Goals(Current goals can be found in the care plan section) Acute Rehab OT Goals Patient Stated Goal: Agreeable to OOB to recliner; enjoying dancing to music OT Goal Formulation: With patient Time For Goal Achievement: 01/16/19 Potential to Achieve Goals: Good  OT Frequency: Min 3X/week   Barriers to D/C:            Co-evaluation PT/OT/SLP Co-Evaluation/Treatment: Yes Reason for Co-Treatment: For patient/therapist safety;To address functional/ADL transfers   OT goals addressed during session: ADL's and self-care      AM-PAC OT "6 Clicks" Daily Activity     Outcome Measure Help from another person eating meals?: A Little Help from another person taking care of personal grooming?: A Little Help from another person toileting, which includes using toliet, bedpan, or urinal?: A Lot Help from another person bathing (including washing, rinsing, drying)?: A Lot Help from another person to put on and taking off regular upper body clothing?: A Lot Help from another person to put on and taking off regular lower body clothing?: A Lot 6 Click Score: 14   End of Session Equipment Utilized During Treatment: Rolling  walker;Oxygen(15L via HFNC) Nurse Communication: Mobility status;Other (comment)(BP)  Activity Tolerance: Patient tolerated treatment well;Patient limited by fatigue Patient left: in chair;with call bell/phone within reach  OT Visit Diagnosis:  Unsteadiness on feet (R26.81);Other abnormalities of gait and mobility (R26.89);Muscle weakness (generalized) (M62.81);Other symptoms and signs involving cognitive function                Time: [id number] OT Time Calculation (min): 42 min Charges:  OT General Charges $OT Visit: 1 Visit OT Evaluation $OT Eval Moderate Complexity: 1 Mod OT Treatments $Self Care/Home Management : 8-22 mins  Mikey Maffett MSOT, OTR/L Acute Rehab Pager: [phone] Office: [place] 01/02/2019, 5:35 PM

## 2019-01-02 NOTE — Progress Notes (Signed)
Heart rate maintaining in the 130's(at rest) after working with physical therapy.  Dr. Candiss Norse notified with order for normal saline 250 cc bolus.  After bolus patient's heart rate now in the 90's.

## 2019-01-02 NOTE — Evaluation (Signed)
Physical Therapy Evaluation Patient Details Name: [name] MRN: [id number] DOB: 27-Sep-1922 Today's Date: 01/02/2019   History of Present Illness  85 yo female presents to the ED at [facility] with worsening SOB and dark urine. Per EMS Satting low 80s initially, low 90s on NRB in ED. PMH including HFrEF with 25-30% EF, Tachy-brady syndrome s/p PPM, CKD stage 4, COPD, HTN, and arthritis.   Clinical Impression  The patient is on 15 L HFNC. Noted HR up to 141, SaO2 reading variable, tried ear lobe without pleth. Patient's BP 109/83 down to 84/50 after transfer to recliner with gradual increased to 99/47. RN aware. HR variable 115-130's. Patient is quite weak. Spoke with granddaughter. Pt admitted with above diagnosis. Pt currently with functional limitations due to the deficits listed below (see PT Problem List). Pt will benefit from skilled PT to increase their independence and safety with mobility to allow discharge to the venue listed below.       Follow Up Recommendations SNF DEPENDING ON  PROGRESS    Equipment Recommendations  None recommended by PT    Recommendations for Other Services       Precautions / Restrictions Precautions Precaution Comments: orthostatic, Incr. HR,      Mobility  Bed Mobility Overal bed mobility: Needs Assistance Bed Mobility: Rolling;Sidelying to Sit Rolling: Mod assist Sidelying to sit: Mod assist       General bed mobility comments: assist with legs and trunk  Transfers Overall transfer level: Needs assistance Equipment used: Rolling walker (2 wheeled) Transfers: Sit to/from [facility] Sit to Stand: Mod assist;+2 physical assistance;+2 safety/equipment Stand pivot transfers: Mod assist;+2 physical assistance;+2 safety/equipment       General transfer comment: assist to rise and shuffle steps to recliner. pat's HR up to 141,Sao2 not picking up well, in 90's on HFNC 15 L.  Ambulation/Gait                Stairs             Wheelchair Mobility    Modified Rankin (Stroke Patients Only)       Balance Overall balance assessment: Needs assistance;History of Falls Sitting-balance support: Feet supported;Bilateral upper extremity supported Sitting balance-Leahy Scale: Fair     Standing balance support: During functional activity;Bilateral upper extremity supported Standing balance-Leahy Scale: Poor Standing balance comment: reliant on UE                             Pertinent Vitals/Pain Pain Assessment: No/denies pain    Home Living Family/patient expects to be discharged to:: Private residence Living Arrangements: Non-relatives/Friends(Granddaughter) Available Help at Discharge: Family;Available 24 hours/day Type of Home: Apartment Home Access: Level entry     Home Layout: One level Home Equipment: Clinical cytogeneticist - 2 wheels;Walker - 4 wheels;Grab bars - tub/shower      Prior Function Level of Independence: Independent with assistive device(s)         Comments: RW     Hand Dominance   Dominant Hand: Right    Extremity/Trunk Assessment        Lower Extremity Assessment Lower Extremity Assessment: Generalized weakness    Cervical / Trunk Assessment Cervical / Trunk Assessment: Normal  Communication   Communication: HOH  Cognition Arousal/Alertness: Awake/alert Behavior During Therapy: WFL for tasks assessed/performed Overall Cognitive Status: Difficult to assess Area of Impairment: Attention;Following commands  Orientation Level: Time;Place     Following Commands: Follows one step commands with increased time       General Comments: oriented to granddaughter, followed commands with increased  time      General Comments      Exercises     Assessment/Plan    PT Assessment Patient needs continued PT services  PT Problem List Decreased strength;Decreased mobility;Decreased coordination;Cardiopulmonary status  limiting activity;Decreased cognition;Decreased activity tolerance;Decreased balance;Decreased knowledge of use of DME       PT Treatment Interventions DME instruction;Gait training;Functional mobility training;Therapeutic exercise;Therapeutic activities    PT Goals (Current goals can be found in the Care Plan section)  Acute Rehab PT Goals Patient Stated Goal: agreed tto getting up PT Goal Formulation: With family Time For Goal Achievement: 01/16/19 Potential to Achieve Goals: Fair    Frequency Min 2X/week   Barriers to discharge        Co-evaluation PT/OT/SLP Co-Evaluation/Treatment: Yes Reason for Co-Treatment: For patient/therapist safety PT goals addressed during session: Mobility/safety with mobility OT goals addressed during session: ADL's and self-care       AM-PAC PT "6 Clicks" Mobility  Outcome Measure Help needed turning from your back to your side while in a flat bed without using bedrails?: A Lot Help needed moving from lying on your back to sitting on the side of a flat bed without using bedrails?: A Lot Help needed moving to and from a bed to a chair (including a wheelchair)?: A Lot Help needed standing up from a chair using your arms (e.g., wheelchair or bedside chair)?: Total Help needed to walk in hospital room?: Total Help needed climbing 3-5 steps with a railing? : Total 6 Click Score: 9    End of Session   Activity Tolerance: Treatment limited secondary to medical complications (Comment) Patient left: in chair;with call bell/phone within reach Nurse Communication: Mobility status(Pt's BP and HR) PT Visit Diagnosis: Unsteadiness on feet (R26.81);Difficulty in walking, not elsewhere classified (R26.2)    Time: [id number] PT Time Calculation (min) (ACUTE ONLY): 42 min   Charges:   PT Evaluation $PT Eval Moderate Complexity: [place] Pager [phone] Office [phone]   Claretha Cooper 01/02/2019, 1:26 PM

## 2019-01-03 ENCOUNTER — Inpatient Hospital Stay (HOSPITAL_COMMUNITY): Payer: Medicare Other

## 2019-01-03 DIAGNOSIS — I340 Nonrheumatic mitral (valve) insufficiency: Secondary | ICD-10-CM

## 2019-01-03 DIAGNOSIS — I361 Nonrheumatic tricuspid (valve) insufficiency: Secondary | ICD-10-CM

## 2019-01-03 LAB — D-DIMER, QUANTITATIVE: D-Dimer, Quant: 12.69 ug{FEU}/mL — ABNORMAL HIGH (ref 0.00–0.50)

## 2019-01-03 LAB — CBC WITH DIFFERENTIAL/PLATELET
Abs Immature Granulocytes: 0.1 10*3/uL — ABNORMAL HIGH (ref 0.00–0.07)
Basophils Absolute: 0 10*3/uL (ref 0.0–0.1)
Basophils Relative: 0 %
Eosinophils Absolute: 0 10*3/uL (ref 0.0–0.5)
Eosinophils Relative: 0 %
HCT: 29.9 % — ABNORMAL LOW (ref 36.0–46.0)
Hemoglobin: 9.6 g/dL — ABNORMAL LOW (ref 12.0–15.0)
Immature Granulocytes: 1 %
Lymphocytes Relative: 6 %
Lymphs Abs: 0.8 10*3/uL (ref 0.7–4.0)
MCH: 30 pg (ref 26.0–34.0)
MCHC: 32.1 g/dL (ref 30.0–36.0)
MCV: 93.4 fL (ref 80.0–100.0)
Monocytes Absolute: 0.4 10*3/uL (ref 0.1–1.0)
Monocytes Relative: 2 %
Neutro Abs: 13.1 10*3/uL — ABNORMAL HIGH (ref 1.7–7.7)
Neutrophils Relative %: 91 %
Platelets: 191 10*3/uL (ref 150–400)
RBC: 3.2 MIL/uL — ABNORMAL LOW (ref 3.87–5.11)
RDW: 15.4 % (ref 11.5–15.5)
WBC: 14.3 10*3/uL — ABNORMAL HIGH (ref 4.0–10.5)
nRBC: 0.8 % — ABNORMAL HIGH (ref 0.0–0.2)

## 2019-01-03 LAB — COMPREHENSIVE METABOLIC PANEL
ALT: 13 U/L (ref 0–44)
AST: 15 U/L (ref 15–41)
Albumin: 2.3 g/dL — ABNORMAL LOW (ref 3.5–5.0)
Alkaline Phosphatase: 63 U/L (ref 38–126)
Anion gap: 15 (ref 5–15)
BUN: 111 mg/dL — ABNORMAL HIGH (ref 8–23)
CO2: 23 mmol/L (ref 22–32)
Calcium: 7.6 mg/dL — ABNORMAL LOW (ref 8.9–10.3)
Chloride: 107 mmol/L (ref 98–111)
Creatinine, Ser: 4.06 mg/dL — ABNORMAL HIGH (ref 0.44–1.00)
GFR calc Af Amer: 10 mL/min — ABNORMAL LOW (ref >60)
GFR calc non Af Amer: 9 mL/min — ABNORMAL LOW (ref >60)
Glucose, Bld: 134 mg/dL — ABNORMAL HIGH (ref 70–99)
Potassium: 4.4 mmol/L (ref 3.5–5.1)
Sodium: 145 mmol/L (ref 135–145)
Total Bilirubin: 0.4 mg/dL (ref 0.3–1.2)
Total Protein: 6.1 g/dL — ABNORMAL LOW (ref 6.5–8.1)

## 2019-01-03 LAB — ECHOCARDIOGRAM LIMITED
Height: 68 in
Weight: 3054.69 [oz_av]

## 2019-01-03 LAB — MAGNESIUM: Magnesium: 2.2 mg/dL (ref 1.7–2.4)

## 2019-01-03 LAB — C-REACTIVE PROTEIN: CRP: 15.4 mg/dL — ABNORMAL HIGH

## 2019-01-03 MED ORDER — CARVEDILOL 3.125 MG PO TABS
3.1250 mg | ORAL_TABLET | Freq: Two times a day (BID) | ORAL | Status: DC
  Filled 2019-01-03 (×2): qty 1

## 2019-01-03 MED ORDER — FUROSEMIDE 10 MG/ML IJ SOLN: 40.0000 mg | Freq: Two times a day (BID) | INTRAMUSCULAR | Status: DC

## 2019-01-03 MED ORDER — METHYLPREDNISOLONE SODIUM SUCC 125 MG IJ SOLR
60.0000 mg | Freq: Two times a day (BID) | INTRAMUSCULAR | Status: DC
  Administered 2019-01-04 (×3): 60 mg via INTRAVENOUS
  Filled 2019-01-03 (×3): qty 2

## 2019-01-03 MED ORDER — LACTATED RINGERS IV SOLN
INTRAVENOUS | Status: AC
  Administered 2019-01-03: 16:00:00 via INTRAVENOUS

## 2019-01-03 NOTE — Progress Notes (Addendum)
PROGRESS NOTE                                                                                                                                                                                                             Patient Demographics:    [name], is a 85 y.o. female, DOB - May 15, 1923, [id number]  Outpatient Primary MD for the patient is [name], MD    LOS - 3  Admit date - 01/01/2019    CC - SOB     Brief Narrative  [name] is a 85 y.o. female with medical history significant of HFrEF with 25-30% EF (though looks like last echo was in 2015), Tachy-brady syndrome s/p PPM which needs battery replacement in near future though they are trying to delay given patients age, CKD stage 4 with baseline creat ~2.0 last month. Patient presented to the ED at [facility] with worsening SOB and dark urine, she was in hypoxic respiratory failure requiring BiPAP/nonrebreather, also had acute renal failure with creatinine close to 6 with no urine output.  She also tested positive for COVID-19 and was transferred here.   Subjective:   Patient in bed, appears comfortable, denies any headache, no fever, no chest pain or pressure, denies shortness of breath , no abdominal pain. No focal weakness.    Assessment  & Plan :     1. Acute Hypoxic Resp. Failure due to Acute Covid 19 Viral Pneumonitis during the ongoing 2020 Covid 19 Pandemic along with likely due to the combination of acute on chronic systolic heart failure with EF of 25 to 30% on last echocardiogram in 2015 + Covid -   For her COVID illness she is getting IV steroids and Remdisvir, monitor inflammatory markers, supportive care with oxygen, flutter valve and proning, prognosis is guarded.  Note patient rarely keeps the nasal cannula off her oxygen tubing in her nostrils, also clenches her feet which does not give a good pulse oximeter read.  This needs to be monitored  closely staff has been told.  For her acute on chronic systolic CHF EF 25 to 36% she is getting IV Lasix as tolerated, low-dose beta-blocker and monitor.  Not a candidate for ACE/ARB due to renal insufficiency.  Again long-term prognosis is extremely poor, this has been clearly explained to patient's primary decision maker her  granddaughter by me, gentle medical treatment no heroics.   COVID-19 Labs  Recent Labs    01/08/2019 1504 01/01/19 0030 01/02/19 0215 01/03/19 0230  DDIMER  --  11.35* 7.65* 12.69*  FERRITIN 832*  --   --   --   CRP 33.3*  --  22.9* 15.4*    Lab Results  Component Value Date   SARSCOV2NAA POSITIVE (A) 01/21/2019     Hepatic Function Latest Ref Rng & Units 01/03/2019 01/02/2019 01/01/2019  Total Protein 6.5 - 8.1 g/dL 6.1(L) 6.1(L) 6.9  Albumin 3.5 - 5.0 g/dL 2.3(L) 2.2(L) 2.5(L)  AST 15 - 41 U/L _0 ALT 0 - 44 U/L _1 Alk Phosphatase 38 - 126 U/L 63 62 57  Total Bilirubin 0.3 - 1.2 mg/dL 0.4 0.2(L) 0.8        Component Value Date/Time   BNP 1,587.0 (H) 01/05/2019 1017      2.  Acute on chronic systolic heart failure EF 25 to 30% in 2015.  See #1 above.  Echo pending for 2 days now, cardiology informed on 01/02/2021 read ASAP.  3.  ARF on CKD 4 baseline creatinine around 2.2.  Currently clinical signs of some CHF decompensation IV Lasix Foley and monitor.  UA was inconclusive, urine electrolytes were also not interpretable as she had received multiple doses of Lasix prior.  Renal function is actually improving with diuresis which will be continued if tolerated by blood pressure. Foley placed here.  4.  History of tachybradycardia syndrome with pacemaker.  Battery due to be changed, per family cardiologists are delaying the battery change due to her advanced age and poor baseline condition and life expectancy.  5.  Hypertension- hold Coreg per parameters if needed.  6.  Acute metabolic and toxic encephalopathy.  Improved with supportive care  now following all commands.  Commence PT.  7.  History of COPD.  Currently stable.   Condition - Extremely Guarded  Family Communication  : grand daughter in detail 01/01/19, DNR, gentle medical treatment.  No heroics.  Code Status :  DNR  Diet :    Diet Order            DIET SOFT Room service appropriate? Yes; Fluid consistency: Thin  Diet effective now               Disposition Plan  : SNF  Consults  :  None  Procedures  :     Renal ultrasound.  Nonacute.  Showing chronic medical disease.    Echocardiogram.  PUD Prophylaxis :    DVT Prophylaxis  :    Heparin    Lab Results  Component Value Date   PLT 191 01/03/2019    Inpatient Medications  Scheduled Meds: . [START ON 01/04/2019] carvedilol  3.125 mg Oral BID WC  . furosemide  40 mg Intravenous BID  . heparin injection (subcutaneous)  7,500 Units Subcutaneous Q8H  . methylPREDNISolone (SOLU-MEDROL) injection  40 mg Intravenous Q12H  . mometasone-formoterol  2 puff Inhalation BID  . umeclidinium bromide  1 puff Inhalation Daily   Continuous Infusions: . remdesivir 100 mg in NS 250 mL Stopped (01/03/19 0701)   PRN Meds:.acetaminophen, albuterol, metoprolol tartrate, [DISCONTINUED] ondansetron **OR** ondansetron (ZOFRAN) IV  Antibiotics  :    Anti-infectives (From admission, onward)   Start     Dose/Rate Route Frequency Ordered Stop   01/01/19 2200  remdesivir 100 mg in sodium chloride 0.9 % 250 mL IVPB  100 mg 500 mL/hr over 30 Minutes Intravenous Every 24 hours 01/13/2019 2259 01/05/19 2159   12/30/2018 2330  remdesivir 200 mg in sodium chloride 0.9 % 250 mL IVPB     200 mg 500 mL/hr over 30 Minutes Intravenous Once 12/25/2018 2259 01/01/19 0043       Time Spent in minutes  30   Lala Lund M.D on 01/03/2019 at 9:28 AM  To page go to [email or web address] - password [facility]  [facility] -  Office  [phone]     See all Orders from today for further details    Objective:   Vitals:    01/02/19 2016 01/02/19 2354 01/03/19 0415 01/03/19 0841  BP: 103/60 104/73 (!) 114/53 (!) 103/55  Pulse: 72 77 68 99  Resp: _0 Temp: 97.9 F (36.6 C) 97.9 F (36.6 C) 97.7 F (36.5 C) 97.9 F (36.6 C)  TempSrc: Axillary Oral Axillary Oral  SpO2: 97% 98% 93% 96%    Wt Readings from Last 3 Encounters:  12/26/2018 86.6 kg     Intake/Output Summary (Last 24 hours) at 01/03/2019 0928 Last data filed at 01/03/2019 0451 Gross per 24 hour  Intake 590 ml  Output 550 ml  Net 40 ml     Physical Exam  Awake Alert,  No new F.N deficits, Normal affect [place].AT,PERRAL Supple Neck,No JVD, No cervical lymphadenopathy appriciated.  Symmetrical Chest wall movement, Good air movement bilaterally, few rales RRR,No Gallops, Rubs or new Murmurs, No Parasternal Heave +ve B.Sounds, Abd Soft, No tenderness, No organomegaly appriciated, No rebound - guarding or rigidity. No Cyanosis, Clubbing or edema, No new Rash or bruise     Data Review:    CBC Recent Labs  Lab 01/18/2019 1017 01/01/19 0030 01/02/19 0215 01/03/19 0230  WBC 9.5 9.5 11.7* 14.3*  HGB 10.2* 9.7* 8.8* 9.6*  HCT 31.7* 31.4* 28.3* 29.9*  PLT 192 192 180 191  MCV 91.1 94.6 93.4 93.4  MCH 29.3 29.2 29.0 30.0  MCHC 32.2 30.9 31.1 32.1  RDW 15.2 15.2 15.3 15.4  LYMPHSABS 1.4 0.7 0.7 0.8  MONOABS 0.4 0.1 0.2 0.4  EOSABS 0.0 0.0 0.0 0.0  BASOSABS 0.0 0.0 0.0 0.0    Chemistries  Recent Labs  Lab 01/11/2019 1017 01/01/19 0030 01/01/19 0830 01/02/19 0215 01/03/19 0230  NA 140 143 145 141 145  K 5.0 4.6 5.2* 4.5 4.4  CL 106 108 109 106 107  CO2 18* 20* 20* 23 23  GLUCOSE 106* 125* 112* 133* 134*  BUN 84* 90* 92* 98* 111*  CREATININE 5.10* 4.84* 4.65* 4.29* 4.06*  CALCIUM 7.0* 7.4* 7.7* 7.3* 7.6*  MG  --   --   --  1.9 2.2  AST 30 26  --  21 15  ALT 13 14  --  13 13  ALKPHOS 67 57  --  62 63  BILITOT 1.2 0.8  --  0.2* 0.4    ------------------------------------------------------------------------------------------------------------------ No results for input(s): CHOL, HDL, LDLCALC, TRIG, CHOLHDL, LDLDIRECT in the last 72 hours.  No results found for: HGBA1C ------------------------------------------------------------------------------------------------------------------ No results for input(s): TSH, T4TOTAL, T3FREE, THYROIDAB in the last 72 hours.  Invalid input(s): FREET3  Cardiac Enzymes No results for input(s): CKMB, TROPONINI, MYOGLOBIN in the last 168 hours.  Invalid input(s): CK ------------------------------------------------------------------------------------------------------------------    Component Value Date/Time   BNP 1,587.0 (H) 01/01/2019 1017    Micro Results Recent Results (from the past 240 hour(s))  Culture, blood (Routine x 2)     Status:  None (Preliminary result)   Collection Time: 01/17/2019 10:17 AM   Specimen: BLOOD  Result Value Ref Range Status   Specimen Description BLOOD LEFT ANTECUBITAL  Final   Special Requests   Final    BOTTLES DRAWN AEROBIC AND ANAEROBIC Blood Culture adequate volume   Culture   Final    NO GROWTH 3 DAYS Performed at [facility], [address] [place], [place] [address]    Report Status PENDING  Incomplete  Culture, blood (Routine x 2)     Status: None (Preliminary result)   Collection Time: 12/24/2018 10:28 AM   Specimen: BLOOD  Result Value Ref Range Status   Specimen Description BLOOD R HAND  Final   Special Requests   Final    BOTTLES DRAWN AEROBIC AND ANAEROBIC Blood Culture adequate volume   Culture   Final    NO GROWTH 3 DAYS Performed at [facility], [address], [place], [place] [address]    Report Status PENDING  Incomplete  SARS Coronavirus 2 [facility] order, Performed in [place] hospital lab) Nasopharyngeal Nasopharyngeal Swab     Status: Abnormal   Collection Time: 01/21/2019 10:42 AM   Specimen:  Nasopharyngeal Swab  Result Value Ref Range Status   SARS Coronavirus 2 POSITIVE (A) NEGATIVE Final    Comment: RESULT CALLED TO, READ BACK BY AND VERIFIED WITH:  Nadyne Coombes AT 8546 01/16/2019 SDR (NOTE) If result is NEGATIVE SARS-CoV-2 target nucleic acids are NOT DETECTED. The SARS-CoV-2 RNA is generally detectable in upper and lower  respiratory specimens during the acute phase of infection. The lowest  concentration of SARS-CoV-2 viral copies this assay can detect is 250  copies / mL. A negative result does not preclude SARS-CoV-2 infection  and should not be used as the sole basis for treatment or other  patient management decisions.  A negative result may occur with  improper specimen collection / handling, submission of specimen other  than nasopharyngeal swab, presence of viral mutation(s) within the  areas targeted by this assay, and inadequate number of viral copies  (<250 copies / mL). A negative result must be combined with clinical  observations, patient history, and epidemiological information. If result is POSITIVE SARS-CoV-2 target nucleic acids are DETECTED.  The SARS-CoV-2 RNA is generally detectable in upper and lower  respiratory specimens during the acute phase of infection.  Positive  results are indicative of active infection with SARS-CoV-2.  Clinical  correlation with patient history and other diagnostic information is  necessary to determine patient infection status.  Positive results do  not rule out bacterial infection or co-infection with other viruses. If result is PRESUMPTIVE POSTIVE SARS-CoV-2 nucleic acids MAY BE PRESENT.   A presumptive positive result was obtained on the submitted specimen  and confirmed on repeat testing.  While 2019 novel coronavirus  (SARS-CoV-2) nucleic acids may be present in the submitted sample  additional confirmatory testing may be necessary for epidemiological  and / or clinical management purposes  to differentiate  between  SARS-CoV-2 and other Sarbecovirus currently known to infect humans.  If clinically indicated additional testing with an alternate test  methodology [phone]) i s advised. The SARS-CoV-2 RNA is generally  detectable in upper and lower respiratory specimens during the acute  phase of infection. The expected result is Negative. Fact Sheet for Patients:  StrictlyIdeas.no Fact Sheet for Healthcare Providers: BankingDealers.co.za This test is not yet approved or cleared by the [place] FDA and has been authorized for detection and/or  diagnosis of SARS-CoV-2 by FDA under an Emergency Use Authorization (EUA).  This EUA will remain in effect (meaning this test can be used) for the duration of the COVID-19 declaration under Section 564(b)(1) of the Act, 21 U.S.C. section 360bbb-3(b)(1), unless the authorization is terminated or revoked sooner. Performed at [facility], [address] [place], [place] [address]     Radiology Reports US Renal  Result Date: 01/01/2019 CLINICAL DATA:  Initial evaluation for acute renal failure. EXAM: RENAL / URINARY TRACT ULTRASOUND COMPLETE COMPARISON:  Prior ultrasound from 12/01/2015 FINDINGS: Right Kidney: Renal measurements: 9.4 x 4.4 x 4.6 cm = volume: 99.8 mL. Diffusely increased echogenicity within the renal parenchyma, compatible with medical renal disease. Underlying mild diffuse cortical thinning. No hydronephrosis. 3.2 x 2.9 x 2.1 cm minimally complex cyst present at the interpolar region. Additional 1.7 x 1.4 x 1.2 cm minimally complex cyst at the lower pole. Left Kidney: Renal measurements: 9.5 x 5.6 x 5.7 cm = volume: 156.8 mL. Diffusely increased echogenicity within the renal parenchyma, compatible with medical renal disease. Underlying mild diffuse cortical thinning. No hydronephrosis. No focal renal lesion. Bladder: Decompressed with a Foley catheter in place. IMPRESSION: 1.  Diffusely increased echogenicity within the renal parenchyma, suggesting medical renal disease. 2. No hydronephrosis. 3. Right renal cysts measuring up to 3.2 cm as above. Electronically Signed   By: Jeannine Boga M.D.   On: 01/01/2019 19:42   Dg Chest Portable 1 View  Result Date: 01/12/2019 CLINICAL DATA:  Cough EXAM: PORTABLE CHEST 1 VIEW COMPARISON:  11/24/2017 chest radiograph. FINDINGS: Stable configuration of 3 lead right subclavian pacemaker. Stable cardiomediastinal silhouette with moderate cardiomegaly. No pneumothorax. No pleural effusion. There is patchy parahilar fluffy and linear opacities throughout both lungs. IMPRESSION: Patchy fluffy and linear parahilar opacities throughout both lungs with moderate cardiomegaly, favor cardiogenic pulmonary edema. Electronically Signed   By: Ilona Sorrel M.D.   On: 01/17/2019 11:05

## 2019-01-03 NOTE — Progress Notes (Signed)
Called to speak with Estill Bamberg, pt's granddaughter. Questions answered, reviewed POC.

## 2019-01-03 NOTE — Progress Notes (Signed)
Called by tele, pt was in 80's with a good pleth. Checked on pt and found her nasal cannula off to one side. Replaced HFNC, sat pt up and asked pt to breathe through her nose. Pt followed commands and maintained sats in low 80's with dips to 72%. O2 turned up to 10L with minimal response. O2 turned up to 15L HFNC but pt only came up to 86%. Good pleth noted during this time. Also noted pt's HR was erratic from 30-120's, palpable by radial pulse. Notified MD via epic chat. Also notified CN and requested CN to page MD due to malfunctioning computer in pt's room. MD responded via epic chat. See new orders.

## 2019-01-04 DIAGNOSIS — J9601 Acute respiratory failure with hypoxia: Secondary | ICD-10-CM

## 2019-01-04 DIAGNOSIS — L8990 Pressure ulcer of unspecified site, unspecified stage: Secondary | ICD-10-CM | POA: Insufficient documentation

## 2019-01-04 LAB — COMPREHENSIVE METABOLIC PANEL
ALT: 13 U/L (ref 0–44)
AST: 16 U/L (ref 15–41)
Albumin: 2.5 g/dL — ABNORMAL LOW (ref 3.5–5.0)
Alkaline Phosphatase: 65 U/L (ref 38–126)
Anion gap: 15 (ref 5–15)
BUN: 119 mg/dL — ABNORMAL HIGH (ref 8–23)
CO2: 22 mmol/L (ref 22–32)
Calcium: 7.9 mg/dL — ABNORMAL LOW (ref 8.9–10.3)
Chloride: 109 mmol/L (ref 98–111)
Creatinine, Ser: 3.88 mg/dL — ABNORMAL HIGH (ref 0.44–1.00)
GFR calc Af Amer: 11 mL/min — ABNORMAL LOW (ref >60)
GFR calc non Af Amer: 9 mL/min — ABNORMAL LOW (ref >60)
Glucose, Bld: 138 mg/dL — ABNORMAL HIGH (ref 70–99)
Potassium: 4.2 mmol/L (ref 3.5–5.1)
Sodium: 146 mmol/L — ABNORMAL HIGH (ref 135–145)
Total Bilirubin: 0.7 mg/dL (ref 0.3–1.2)
Total Protein: 6.4 g/dL — ABNORMAL LOW (ref 6.5–8.1)

## 2019-01-04 LAB — CBC WITH DIFFERENTIAL/PLATELET
Abs Immature Granulocytes: 0.16 10*3/uL — ABNORMAL HIGH (ref 0.00–0.07)
Basophils Absolute: 0 10*3/uL (ref 0.0–0.1)
Basophils Relative: 0 %
Eosinophils Absolute: 0 10*3/uL (ref 0.0–0.5)
Eosinophils Relative: 0 %
HCT: 30.2 % — ABNORMAL LOW (ref 36.0–46.0)
Hemoglobin: 9.7 g/dL — ABNORMAL LOW (ref 12.0–15.0)
Immature Granulocytes: 1 %
Lymphocytes Relative: 5 %
Lymphs Abs: 0.7 10*3/uL (ref 0.7–4.0)
MCH: 29.7 pg (ref 26.0–34.0)
MCHC: 32.1 g/dL (ref 30.0–36.0)
MCV: 92.4 fL (ref 80.0–100.0)
Monocytes Absolute: 0.3 10*3/uL (ref 0.1–1.0)
Monocytes Relative: 2 %
Neutro Abs: 11.3 10*3/uL — ABNORMAL HIGH (ref 1.7–7.7)
Neutrophils Relative %: 92 %
Platelets: 191 10*3/uL (ref 150–400)
RBC: 3.27 MIL/uL — ABNORMAL LOW (ref 3.87–5.11)
RDW: 15.4 % (ref 11.5–15.5)
WBC: 12.4 10*3/uL — ABNORMAL HIGH (ref 4.0–10.5)
nRBC: 2.2 % — ABNORMAL HIGH (ref 0.0–0.2)

## 2019-01-04 LAB — C-REACTIVE PROTEIN: CRP: 12.3 mg/dL — ABNORMAL HIGH (ref <1.0)

## 2019-01-04 LAB — MAGNESIUM: Magnesium: 2.3 mg/dL (ref 1.7–2.4)

## 2019-01-04 LAB — D-DIMER, QUANTITATIVE: D-Dimer, Quant: 9.98 ug/mL-FEU — ABNORMAL HIGH (ref 0.00–0.50)

## 2019-01-04 LAB — GLUCOSE, CAPILLARY: Glucose-Capillary: 122 mg/dL — ABNORMAL HIGH (ref 70–99)

## 2019-01-04 MED ORDER — TIOTROPIUM BROMIDE MONOHYDRATE 18 MCG IN CAPS: 18.0000 ug | ORAL_CAPSULE | Freq: Two times a day (BID) | RESPIRATORY_TRACT | Status: DC

## 2019-01-04 MED ORDER — ALLOPURINOL 100 MG PO TABS
100.0000 mg | ORAL_TABLET | Freq: Every day | ORAL | Status: DC
  Administered 2019-01-04 – 2019-01-07 (×4): 100 mg via ORAL
  Filled 2019-01-04 (×4): qty 1

## 2019-01-04 MED ORDER — ORAL CARE MOUTH RINSE
15.0000 mL | Freq: Two times a day (BID) | OROMUCOSAL | Status: DC
  Administered 2019-01-04 – 2019-01-06 (×6): 15 mL via OROMUCOSAL

## 2019-01-04 MED ORDER — METOPROLOL TARTRATE 25 MG PO TABS
12.5000 mg | ORAL_TABLET | Freq: Two times a day (BID) | ORAL | Status: DC
  Administered 2019-01-04: 12.5 mg via ORAL
  Filled 2019-01-04: qty 1

## 2019-01-04 MED ORDER — CHLORHEXIDINE GLUCONATE 0.12 % MT SOLN
15.0000 mL | Freq: Two times a day (BID) | OROMUCOSAL | Status: DC
  Administered 2019-01-04 – 2019-01-07 (×7): 15 mL via OROMUCOSAL
  Filled 2019-01-04 (×6): qty 15

## 2019-01-04 MED ORDER — CITALOPRAM HYDROBROMIDE 10 MG PO TABS
10.0000 mg | ORAL_TABLET | Freq: Every day | ORAL | Status: DC
  Administered 2019-01-04 – 2019-01-07 (×4): 10 mg via ORAL
  Filled 2019-01-04 (×4): qty 1

## 2019-01-04 NOTE — Progress Notes (Signed)
   01/04/19 0000  Oxygen Therapy  SpO2 (!) 69 % (Pt removed  oxygen)   Tele notified this writer patient's oxygen sats in 47's writer observed HFNC lying on bed.  12L HFNC replaced, HOB elevated to high fowlers position, new Oxygen sensor applied.  Patient sats at 96% after 15 mins.

## 2019-01-04 NOTE — Progress Notes (Signed)
Heart rate constantly  decreasing to 20's briefly  with oxygen sats 70-80's  then  Heart  130's. Labored breathing noted HFNC 15L in place.  MD notified at bedside at this time.

## 2019-01-04 NOTE — Progress Notes (Signed)
PT Cancellation Note  Patient Details Name: ICESS LEATON MRN: [id number] DOB: 1922-06-16   Cancelled Treatment:    Reason Eval/Treat Not Completed: Medical issues which prohibited therapy. Transferred to ICU for possible external pacer.    Claretha Cooper 01/04/2019, 7:00 AM [place]  Office [phone]

## 2019-01-04 NOTE — Progress Notes (Signed)
Patient admit to ICU at 0545. No orders placed upon arrival. [facility] hospitalist and am currently waiting for him to communicate with family to determine whether or not to pace. Patient heart rate 100s-120s. Sats 100% on 15L, pressure stable at this time. This RN communicated with elink that patient was transferred to ICU- elink unfamiliar with patient. Waiting for response.

## 2019-01-04 NOTE — Progress Notes (Signed)
OT Cancellation Note  Patient Details Name: [name] MRN: [id number] DOB: [birth year]   Cancelled Treatment:    Reason Eval/Treat Not Completed: Patient not medically ready(Transferred to ICU for possible external pacer. )  [place], OTR/L Acute Rehab Pager: [phone] Office: [phone] 01/04/2019, 7:31 AM

## 2019-01-04 NOTE — Progress Notes (Addendum)
0430 Writer notified grand daughter Annsleigh Siner via mobile # [phone] of plan to transfer patient to ICU and for possible external pacer placement. Estill Bamberg verbalized  acceptance to plan. Patient transferred to ICU via bed.

## 2019-01-04 NOTE — Consult Note (Addendum)
NAME:  [name], MRN:  [id number], DOB:  1923-03-02, LOS: 4 ADMISSION DATE:  01/17/2019, CONSULTATION DATE:  01/04/19 REFERRING MD:  Charlynne Cousins, MD CHIEF COMPLAINT:  Bradycardia  Brief History   85 year old female with hx tachybradycardia syndrome s/p PM admitted for COVID-19 pneumonia and heart failure exacerbation. Transferred to ICU overnight for bradycardia and worsening hypoxemia. PCCM consulted for consideration of cardiac pacing.  History of present illness   [name] is a 85 year old female with hx tachybradycardia syndrome s/p PM, combined chronic heart failure, CKD IV and HTN who presented with shortness of breath and found hypoxemic. Labs significant for positive SARS COVID test. Admitted to [facility] for acute hypoxemic respiratory failure secondary to COVID-19 pneumonia and acute on chronic heart failure exacerbation. Treated with HFNC, steroids and remdesivir. On 8/12, she was transferred to ICU for bradycardia and worsening hypoxemia. PCCM consulted for consideration of cardiac pacing. After arrival to ICU, patient's heart rate normalized to >100s with saturations >96% on 15L HFNC. Past Medical History  Combined heart failure tachybradycardia syndrome s/p PM  CKD IV HTN  Significant Hospital Events   8/8 Admitted to [facility] [address] 8/12 Transferred to ICU  Consults:  PCCM  Procedures:    Significant Diagnostic Tests:  TTE 30-35%, inferior wall hypokinesis, moderate TR  Micro Data:    Antimicrobials:  Remdesivir 8/8> Solumedrol 8/8>  Interim history/subjective:  No complaints. Denies chest pain, shortness of breath.  Objective   Blood pressure 111/71, pulse 87, temperature 97.8 F (36.6 C), temperature source Axillary, resp. rate (!) 22, SpO2 90 %.        Intake/Output Summary (Last 24 hours) at 01/04/2019 1730 Last data filed at 01/04/2019 1700 Gross per 24 hour  Intake 1064.43 ml  Output 1070 ml  Net -5.57 ml   Physical Exam: General: Elderly  frail-appearing female, no acute distress HENT: [place], AT, OP clear, MMM Eyes: EOMI, no scleral icterus Respiratory: Clear to auscultation bilaterally.  No crackles, wheezing or rales Cardiovascular: RRR, -M/R/G, no JVD GI: BS+, soft, nontender Extremities:-Edema,-tenderness Neuro: AAO x4, follows commands, moves extremities x 4 Skin: Intact, no rashes or bruising Psych: Normal mood, normal affect GU: Foley in place  Resolved Hospital Problem list     Assessment & Plan:   Hx tachy-brady syndrome s/p PM Per progress notes, battery change for PM delayed due to advanced age and life expectancy. Hospitalist team communicated with family to continue gentle medical management with avoidance of heroic measures. Plan Would not recommend external pacemaker for future arrhthymias as this would not align with goals of care  Acute hypoxemic respiratory failure secondary to COVID-19: C/b heart failure Maintain O2 saturations on HFNC for goal SpO2 >85% Monitor closely for signs of ventilatory failure such as nasal flaring, accessory muscle use, abdominal paradoxical movements. Recommend self-proning as tolerated  Solumedrol 0.5-1 mg/kg daily x 10 days On Remdsivir Pulmonary hygiene including bronchodilator and flutter valve. AVOID manual percussion. Mobilize as tolerated  Combined heart failure exacerbation Diuresis Home BB  Pulmonary will sign off. Please call as needed.  Best practice:  Diet: Soft diet Pain/Anxiety/Delirium protocol (if indicated): -- VAP protocol (if indicated): -- DVT prophylaxis: SQ heparin GI prophylaxis: -- Glucose control: per TRH Mobility: BR Code Status: DNR Family Communication: Per TR[facility] Disposition: Transfer to floor  Labs   CBC: Recent Labs  Lab 01/18/2019 1017 01/01/19 0030 01/02/19 0215 01/03/19 0230 01/04/19 0235  WBC 9.5 9.5 11.7* 14.3* 12.4*  NEUTROABS 7.6 8.6* 10.7*  13.1* 11.3*  HGB 10.2* 9.7* 8.8* 9.6* 9.7*  HCT 31.7* 31.4* 28.3* 29.9*  30.2*  MCV 91.1 94.6 93.4 93.4 92.4  PLT 192 192 180 191 [id number]    Basic Metabolic Panel: Recent Labs  Lab 01/01/19 0030 01/01/19 0830 01/02/19 0215 01/03/19 0230 01/04/19 0235  NA 143 145 141 145 146*  K 4.6 5.2* 4.5 4.4 4.2  CL 108 109 106 107 109  CO2 20* 20* 23 23 22   GLUCOSE 125* 112* 133* 134* 138*  BUN 90* 92* 98* 111* 119*  CREATININE 4.84* 4.65* 4.29* 4.06* 3.88*  CALCIUM 7.4* 7.7* 7.3* 7.6* 7.9*  MG  --   --  1.9 2.2 2.3   GFR: Estimated Creatinine Clearance: 10.2 mL/min (A) (by C-G formula based on SCr of 3.88 mg/dL (H)). Recent Labs  Lab 01/03/2019 1017 12/28/2018 1217 01/01/19 0030 01/02/19 0215 01/03/19 0230 01/04/19 0235  WBC 9.5  --  9.5 11.7* 14.3* 12.4*  LATICACIDVEN 2.2* 1.4  --   --   --   --     Liver Function Tests: Recent Labs  Lab 01/10/2019 1017 01/01/19 0030 01/02/19 0215 01/03/19 0230 01/04/19 0235  AST 30 26 21 15 16   ALT 13 14 13 13 13   ALKPHOS 67 57 62 63 65  BILITOT 1.2 0.8 0.2* 0.4 0.7  PROT 6.9 6.9 6.1* 6.1* 6.4*  ALBUMIN 2.8* 2.5* 2.2* 2.3* 2.5*   No results for input(s): LIPASE, AMYLASE in the last 168 hours. Recent Labs  Lab 01/03/2019 1043  AMMONIA <9*    ABG No results found for: PHART, PCO2ART, PO2ART, HCO3, TCO2, ACIDBASEDEF, O2SAT   Coagulation Profile: Recent Labs  Lab 01/08/2019 1017  INR 1.1    Cardiac Enzymes: No results for input(s): CKTOTAL, CKMB, CKMBINDEX, TROPONINI in the last 168 hours.  HbA1C: No results found for: HGBA1C  CBG: Recent Labs  Lab 01/04/19 0754  GLUCAP 122*    Review of Systems:   Review of Systems  Constitutional: Negative for chills, diaphoresis, fever, malaise/fatigue and weight loss.  HENT: Negative for congestion and sore throat.   Eyes: Negative for blurred vision.  Respiratory: Negative for cough, hemoptysis, sputum production, shortness of breath and wheezing.   Cardiovascular: Negative for chest pain, palpitations and leg swelling.  Gastrointestinal: Negative for  abdominal pain, diarrhea, heartburn and nausea.  Musculoskeletal: Negative for joint pain and myalgias.  Skin: Negative for rash.  Neurological: Negative for dizziness, weakness and headaches.  Endo/Heme/Allergies: Negative for environmental allergies.  Psychiatric/Behavioral: The patient is not nervous/anxious.      Past Medical History  She,  has no past medical history on file.   Surgical History   History reviewed. No pertinent surgical history.   Social History      Family History   Her family history is not on file.   Allergies No Known Allergies   Home Medications  Prior to Admission medications   Medication Sig Start Date End Date Taking? Authorizing Provider  acetaminophen (ARTHRITIS PAIN) 650 MG CR tablet Take 650 mg by mouth 2 (two) times daily.   Yes [provider]  allopurinol (ZYLOPRIM) 100 MG tablet Take 100 mg by mouth daily.   Yes [provider]  amLODipine (NORVASC) 5 MG tablet Take 5 mg by mouth daily.   Yes [provider]  carvedilol (COREG) 6.25 MG tablet Take 6.25 mg by mouth 2 (two) times daily with a meal.   Yes [provider]  citalopram (CELEXA) 10 MG tablet Take 10  mg by mouth daily.   Yes [provider]  furosemide (LASIX) 20 MG tablet Take 20 mg by mouth 2 (two) times daily.   Yes [provider]  pantoprazole (PROTONIX) 40 MG tablet Take 40 mg by mouth 2 (two) times daily.   Yes [provider]  tiotropium (SPIRIVA) 18 MCG inhalation capsule Place 18 mcg into inhaler and inhale 2 (two) times daily.   Yes [provider]     Critical care time: 45 min    The patient is critically ill with multiple organ systems failure and requires high complexity decision making for assessment and support, frequent evaluation and titration of therapies, application of advanced monitoring technologies and extensive interpretation of multiple databases.   Discussed and co-managed patient  care with PCCM-Hospitalist. Coordinated care with RT, RN and pharmacist.  Rodman Pickle, M.D. [facility] Pulmonary/Critical Care Medicine 01/04/2019 5:30 PM  Pager: [phone] After hours pager: [phone]

## 2019-01-04 NOTE — Progress Notes (Signed)
[facility] PROGRESS NOTE    Progress Note  [name]  [id number] DOB: [birth year] DOA: 01/06/2019 PCP: [name], MD     Brief Narrative:   [name] is an 85 y.o. female past medical history significant for chronic systolic heart failure with an EF of 25%, tachybradycardia syndrome status post pacemaker which needs battery replacement in the near future though they are trying to delay given her age, chronic kidney disease stage IV with a baseline creatinine around 2.  Came to [facility] with worsening shortness of breath and dark urine was noted to be hypoxic requiring nonrebreather and acute renal failure with a creatinine like around 6.  Assessment/Plan:   Acute respiratory failure with hypoxia (HCC) due to  Acute respiratory disease due to COVID-19 virus/Acute on chronic systolic CHF (congestive heart failure) [facility]): Patient has multiple risk factors including age for acute decompensation. She is current requiring 15 L to keep saturations above 84%. She was started on IV Remdesivir on 01/12/2019 continue flutter valve and oxygen requirement. Her inflammatory markers are improving. She was given IV Lasix and continue on beta-blockers IV low-dose. She is not a candidate for ARB due to renal insufficiency. The previous physician spoke to the family, she has an extremely poor prognosis the patient has they will continue gentle medical treatments with heroics.  Acute kidney failure (HCC) on chronic kidney disease stage IV: With a baseline creatinine like around 2.  On admission her creatinine was 5.1. She was given IV Lasix as recent fluid overloaded. Her renal function has remained stable with IV diuresis, now her sodium is worsening.  History of tachybradycardia syndrome with pacemaker: Per family cardiology is delaying battery change due to her advanced age and possible poor condition and life expectancy.  Essential hypertension: Blood pressure is fairly  controlled.  Acute metabolic encephalopathy Improved with supportive care. Physical therapy was consulted  Pressure injury of skin RN Pressure Injury Documentation: Pressure Injury 01/04/19 Coccyx Stage II -  Partial thickness loss of dermis presenting as a shallow open ulcer with a red, pink wound bed without slough. (Active)  01/04/19 0612  Location: Coccyx  Location Orientation:   Staging: Stage II -  Partial thickness loss of dermis presenting as a shallow open ulcer with a red, pink wound bed without slough.  Wound Description (Comments):   Present on Admission:     DVT prophylaxis: lovenox Family Communication:gransdaughter Disposition Plan/Barrier to D/C:once off oxygen Code Status:     Code Status Orders  (From admission, onward)         Start     Ordered   01/01/19 1004  Do not attempt resuscitation (DNR)  Continuous    Question Answer Comment  In the event of cardiac or respiratory ARREST Do not call a "code blue"   In the event of cardiac or respiratory ARREST Do not perform Intubation, CPR, defibrillation or ACLS   In the event of cardiac or respiratory ARREST Use medication by any route, position, wound care, and other measures to relive pain and suffering. May use oxygen, suction and manual treatment of airway obstruction as needed for comfort.      01/01/19 1003        Code Status History    Date Active Date Inactive Code Status Order ID Comments User Context   01/10/2019 2300 01/01/2019 1003 Full Code [id number]  Etta Quill, DO Inpatient   Advance Care Planning Activity        IV Access:  Peripheral IV   Procedures and diagnostic studies:   No results found.   Medical Consultants:    None.  Anti-Infectives:   Remdesivir  Subjective:    [name] she has no new complaints  Objective:    Vitals:   01/04/19 0545 01/04/19 0600 01/04/19 0630 01/04/19 0700  BP: 108/68 120/86 107/63 115/70  Pulse:  (!) 107  (!) 102   Resp:  (!) 25  (!) 23  Temp:  97.6 F (36.4 C)    TempSrc:  Oral    SpO2:  100%  98%   SpO2: 98 % O2 Flow Rate (L/min): 15 L/min   Intake/Output Summary (Last 24 hours) at 01/04/2019 0734 Last data filed at 01/04/2019 0600 Gross per 24 hour  Intake 1510.43 ml  Output 695 ml  Net 815.43 ml   There were no vitals filed for this visit.  Exam: General exam: In no acute distress. Respiratory system: Good air movement and diffuse crackles bilaterally Cardiovascular system: S1 & S2 heard, RRR. No JVD.  Gastrointestinal system: Abdomen is nondistended, soft and nontender.  Central nervous system: Alert and oriented. No focal neurological deficits. Extremities: No pedal edema. Skin: No rashes, lesions or ulcers Psychiatry: Judgement and insight appear normal. Mood & affect appropriate.    Data Reviewed:    Labs: Basic Metabolic Panel: Recent Labs  Lab 01/01/19 0030 01/01/19 0830 01/02/19 0215 01/03/19 0230 01/04/19 0235  NA 143 145 141 145 146*  K 4.6 5.2* 4.5 4.4 4.2  CL 108 109 106 107 109  CO2 20* 20* 23 23 22   GLUCOSE 125* 112* 133* 134* 138*  BUN 90* 92* 98* 111* 119*  CREATININE 4.84* 4.65* 4.29* 4.06* 3.88*  CALCIUM 7.4* 7.7* 7.3* 7.6* 7.9*  MG  --   --  1.9 2.2 2.3   GFR Estimated Creatinine Clearance: 10.2 mL/min (A) (by C-G formula based on SCr of 3.88 mg/dL (H)). Liver Function Tests: Recent Labs  Lab 01/10/2019 1017 01/01/19 0030 01/02/19 0215 01/03/19 0230 01/04/19 0235  AST 30 26 21 15 16   ALT 13 14 13 13 13   ALKPHOS 67 57 62 63 65  BILITOT 1.2 0.8 0.2* 0.4 0.7  PROT 6.9 6.9 6.1* 6.1* 6.4*  ALBUMIN 2.8* 2.5* 2.2* 2.3* 2.5*   No results for input(s): LIPASE, AMYLASE in the last 168 hours. Recent Labs  Lab 12/27/2018 1043  AMMONIA <9*   Coagulation profile Recent Labs  Lab 12/29/2018 1017  INR 1.1   COVID-19 Labs  Recent Labs    01/02/19 0215 01/03/19 0230 01/04/19 0235  DDIMER 7.65* 12.69* 9.98*  CRP 22.9* 15.4* 12.3*    Lab  Results  Component Value Date   SARSCOV2NAA POSITIVE (A) 12/28/2018    CBC: Recent Labs  Lab 01/03/2019 1017 01/01/19 0030 01/02/19 0215 01/03/19 0230 01/04/19 0235  WBC 9.5 9.5 11.7* 14.3* 12.4*  NEUTROABS 7.6 8.6* 10.7* 13.1* 11.3*  HGB 10.2* 9.7* 8.8* 9.6* 9.7*  HCT 31.7* 31.4* 28.3* 29.9* 30.2*  MCV 91.1 94.6 93.4 93.4 92.4  PLT 192 192 180 191 191   Cardiac Enzymes: No results for input(s): CKTOTAL, CKMB, CKMBINDEX, TROPONINI in the last 168 hours. BNP (last 3 results) No results for input(s): PROBNP in the last 8760 hours. CBG: No results for input(s): GLUCAP in the last 168 hours. D-Dimer: Recent Labs    01/03/19 0230 01/04/19 0235  DDIMER 12.69* 9.98*   Hgb A1c: No results for input(s): HGBA1C in the last 72 hours. Lipid Profile: No results  for input(s): CHOL, HDL, LDLCALC, TRIG, CHOLHDL, LDLDIRECT in the last 72 hours. Thyroid function studies: No results for input(s): TSH, T4TOTAL, T3FREE, THYROIDAB in the last 72 hours.  Invalid input(s): FREET3 Anemia work up: No results for input(s): VITAMINB12, FOLATE, FERRITIN, TIBC, IRON, RETICCTPCT in the last 72 hours. Sepsis Labs: Recent Labs  Lab 01/04/2019 1017 01/14/2019 1217 01/01/19 0030 01/02/19 0215 01/03/19 0230 01/04/19 0235  WBC 9.5  --  9.5 11.7* 14.3* 12.4*  LATICACIDVEN 2.2* 1.4  --   --   --   --    Microbiology Recent Results (from the past 240 hour(s))  Culture, blood (Routine x 2)     Status: None (Preliminary result)   Collection Time: 01/22/2019 10:17 AM   Specimen: BLOOD  Result Value Ref Range Status   Specimen Description BLOOD LEFT ANTECUBITAL  Final   Cassandra Requests   Final    BOTTLES DRAWN AEROBIC AND ANAEROBIC Blood Culture adequate volume   Culture   Final    NO GROWTH 3 DAYS Performed at [facility], [address] [place], [place] [address]    Report Status PENDING  Incomplete  Culture, blood (Routine x 2)     Status: None (Preliminary result)   Collection  Time: 12/29/2018 10:28 AM   Specimen: BLOOD  Result Value Ref Range Status   Specimen Description BLOOD R HAND  Final   Cassandra Requests   Final    BOTTLES DRAWN AEROBIC AND ANAEROBIC Blood Culture adequate volume   Culture   Final    NO GROWTH 3 DAYS Performed at [facility], [address] [place], [place] [address]    Report Status PENDING  Incomplete  SARS Coronavirus 2 [facility] order, Performed in [place] hospital lab) Nasopharyngeal Nasopharyngeal Swab     Status: Abnormal   Collection Time: 12/27/2018 10:42 AM   Specimen: Nasopharyngeal Swab  Result Value Ref Range Status   SARS Coronavirus 2 POSITIVE (A) NEGATIVE Final    Comment: RESULT CALLED TO, READ BACK BY AND VERIFIED WITH:  Nadyne Coombes AT [id number] 12/28/2018 SDR (NOTE) If result is NEGATIVE SARS-CoV-2 target nucleic acids are NOT DETECTED. The SARS-CoV-2 RNA is generally detectable in upper and lower  respiratory specimens during the acute phase of infection. The lowest  concentration of SARS-CoV-2 viral copies this assay can detect is 250  copies / mL. A negative result does not preclude SARS-CoV-2 infection  and should not be used as the sole basis for treatment or other  patient management decisions.  A negative result may occur with  improper specimen collection / handling, submission of specimen other  than nasopharyngeal swab, presence of viral mutation(s) within the  areas targeted by this assay, and inadequate number of viral copies  (<250 copies / mL). A negative result must be combined with clinical  observations, patient history, and epidemiological information. If result is POSITIVE SARS-CoV-2 target nucleic acids are DETECTED.  The SARS-CoV-2 RNA is generally detectable in upper and lower  respiratory specimens during the acute phase of infection.  Positive  results are indicative of active infection with SARS-CoV-2.  Clinical  correlation with patient history and other diagnostic  information is  necessary to determine patient infection status.  Positive results do  not rule out bacterial infection or co-infection with other viruses. If result is PRESUMPTIVE POSTIVE SARS-CoV-2 nucleic acids MAY BE PRESENT.   A presumptive positive result was obtained on the submitted specimen  and confirmed on repeat testing.  While 2019  novel coronavirus  (SARS-CoV-2) nucleic acids may be present in the submitted sample  additional confirmatory testing may be necessary for epidemiological  and / or clinical management purposes  to differentiate between  SARS-CoV-2 and other Sarbecovirus currently known to infect humans.  If clinically indicated additional testing with an alternate test  methodology [phone]) i s advised. The SARS-CoV-2 RNA is generally  detectable in upper and lower respiratory specimens during the acute  phase of infection. The expected result is Negative. Fact Sheet for Patients:  StrictlyIdeas.no Fact Sheet for Healthcare Providers: BankingDealers.co.za This test is not yet approved or cleared by the [place] FDA and has been authorized for detection and/or diagnosis of SARS-CoV-2 by FDA under an Emergency Use Authorization (EUA).  This EUA will remain in effect (meaning this test can be used) for the duration of the COVID-19 declaration under Section 564(b)(1) of the Act, 21 U.S.C. section 360bbb-3(b)(1), unless the authorization is terminated or revoked sooner. Performed at [facility], [address] [place],  [address]      Medications:   . heparin injection (subcutaneous)  7,500 Units Subcutaneous Q8H  . methylPREDNISolone (SOLU-MEDROL) injection  60 mg Intravenous Q12H  . mometasone-formoterol  2 puff Inhalation BID  . umeclidinium bromide  1 puff Inhalation Daily   Continuous Infusions: . remdesivir 100 mg in NS 250 mL 100 mg (01/03/19 2116)      LOS: 4 days    Charlynne Cousins  [facility]  01/04/2019, 7:34 AM

## 2019-01-05 LAB — CBC WITH DIFFERENTIAL/PLATELET
Abs Immature Granulocytes: 0.17 10*3/uL — ABNORMAL HIGH (ref 0.00–0.07)
Basophils Absolute: 0 10*3/uL (ref 0.0–0.1)
Basophils Relative: 0 %
Eosinophils Absolute: 0 10*3/uL (ref 0.0–0.5)
Eosinophils Relative: 0 %
HCT: 28.7 % — ABNORMAL LOW (ref 36.0–46.0)
Hemoglobin: 9 g/dL — ABNORMAL LOW (ref 12.0–15.0)
Immature Granulocytes: 2 %
Lymphocytes Relative: 7 %
Lymphs Abs: 0.7 10*3/uL (ref 0.7–4.0)
MCH: 29 pg (ref 26.0–34.0)
MCHC: 31.4 g/dL (ref 30.0–36.0)
MCV: 92.6 fL (ref 80.0–100.0)
Monocytes Absolute: 0.3 10*3/uL (ref 0.1–1.0)
Monocytes Relative: 3 %
Neutro Abs: 8.4 10*3/uL — ABNORMAL HIGH (ref 1.7–7.7)
Neutrophils Relative %: 88 %
Platelets: 174 10*3/uL (ref 150–400)
RBC: 3.1 MIL/uL — ABNORMAL LOW (ref 3.87–5.11)
RDW: 15.6 % — ABNORMAL HIGH (ref 11.5–15.5)
WBC: 9.6 10*3/uL (ref 4.0–10.5)
nRBC: 3.4 % — ABNORMAL HIGH (ref 0.0–0.2)

## 2019-01-05 LAB — COMPREHENSIVE METABOLIC PANEL
ALT: 13 U/L (ref 0–44)
AST: 15 U/L (ref 15–41)
Albumin: 2.3 g/dL — ABNORMAL LOW (ref 3.5–5.0)
Alkaline Phosphatase: 61 U/L (ref 38–126)
Anion gap: 15 (ref 5–15)
BUN: 120 mg/dL — ABNORMAL HIGH (ref 8–23)
CO2: 22 mmol/L (ref 22–32)
Calcium: 8.3 mg/dL — ABNORMAL LOW (ref 8.9–10.3)
Chloride: 111 mmol/L (ref 98–111)
Creatinine, Ser: 3.36 mg/dL — ABNORMAL HIGH (ref 0.44–1.00)
GFR calc Af Amer: 13 mL/min — ABNORMAL LOW (ref >60)
GFR calc non Af Amer: 11 mL/min — ABNORMAL LOW (ref >60)
Glucose, Bld: 133 mg/dL — ABNORMAL HIGH (ref 70–99)
Potassium: 4.3 mmol/L (ref 3.5–5.1)
Sodium: 148 mmol/L — ABNORMAL HIGH (ref 135–145)
Total Bilirubin: 0.6 mg/dL (ref 0.3–1.2)
Total Protein: 6.1 g/dL — ABNORMAL LOW (ref 6.5–8.1)

## 2019-01-05 LAB — D-DIMER, QUANTITATIVE: D-Dimer, Quant: 7.78 ug/mL-FEU — ABNORMAL HIGH (ref 0.00–0.50)

## 2019-01-05 LAB — CULTURE, BLOOD (ROUTINE X 2)
Culture: NO GROWTH
Culture: NO GROWTH
Special Requests: ADEQUATE
Special Requests: ADEQUATE

## 2019-01-05 LAB — C-REACTIVE PROTEIN: CRP: 10.1 mg/dL — ABNORMAL HIGH (ref <1.0)

## 2019-01-05 LAB — MAGNESIUM: Magnesium: 2.1 mg/dL (ref 1.7–2.4)

## 2019-01-05 MED ORDER — METHYLPREDNISOLONE SODIUM SUCC 40 MG IJ SOLR
40.0000 mg | Freq: Two times a day (BID) | INTRAMUSCULAR | Status: DC
  Administered 2019-01-05 – 2019-01-06 (×4): 40 mg via INTRAVENOUS
  Filled 2019-01-05 (×4): qty 1

## 2019-01-05 MED ORDER — METOPROLOL TARTRATE 25 MG PO TABS
12.5000 mg | ORAL_TABLET | Freq: Two times a day (BID) | ORAL | Status: DC
  Administered 2019-01-05 – 2019-01-07 (×5): 12.5 mg via ORAL
  Filled 2019-01-05 (×5): qty 1

## 2019-01-05 MED ORDER — DEXTROSE 5 % IV SOLN
INTRAVENOUS | Status: AC
  Administered 2019-01-05: 11:00:00 via INTRAVENOUS

## 2019-01-05 NOTE — Progress Notes (Signed)
Physical Therapy Treatment Patient Details Name: [name] MRN: [id number] DOB: 11-Oct-1922 Today's Date: 01/05/2019    History of Present Illness 85 yo female presents to the ED at [facility] with worsening SOB and dark urine. Per EMS Satting low 80s initially, low 90s on NRB in ED. PMH including HFrEF with 25-30% EF, Tachy-brady syndrome s/p PPM, CKD stage 4, COPD, HTN, and arthritis.     PT Comments    The patient aroused for mobilizing to sitting on bed edge with 2 assisting HR up to 130,on 5 L HFNC. The patient remained awake, periods of" staring", humming at times. Patient did assist in bed mobility. Continue PT efforts   Follow Up Recommendations  SNF     Equipment Recommendations  None recommended by PT    Recommendations for Other Services       Precautions / Restrictions Precautions Precaution Comments: monitor sats and HR    Mobility  Bed Mobility Overal bed mobility: Needs Assistance Bed Mobility: Rolling;Sidelying to Sit;Sit to Supine Rolling: Mod assist;+2 for safety/equipment Sidelying to sit: Mod assist;+2 for safety/equipment   Sit to supine: Mod assist;+2 for safety/equipment   General bed mobility comments: assist with legs and trunk to sit up, pt. placed legs onto bed  to return to suppine with Min Assist.  Transfers Overall transfer level: Needs assistance Equipment used: Rolling walker (2 wheeled) Transfers: Sit to/from Stand Sit to Stand: Mod assist;+2 physical assistance;+2 safety/equipment         General transfer comment: assist to rise x 2 from bed using RW and bed pad to lift buttocks. Stood briefly x 2. HR max 130, On 5 L HFNC w/ SaO2 >88-90% or higher, frequently no SaO2 reading/  Ambulation/Gait                 Stairs             Wheelchair Mobility    Modified Rankin (Stroke Patients Only)       Balance Overall balance assessment: Needs assistance;History of Falls Sitting-balance support: No upper extremity  supported   Sitting balance - Comments: sat on bed edge x 8 minutes at least                                    Cognition Arousal/Alertness: Awake/alert Behavior During Therapy: WFL for tasks assessed/performed Overall Cognitive Status: Difficult to assess Area of Impairment: Attention;Following commands                   Current Attention Level: Divided           General Comments: at times noted to be just staring out. when stimulated, pt. responds.      Exercises Other Exercises Other Exercises: pt swinging legs while sitting.    General Comments        Pertinent Vitals/Pain Pain Assessment: Faces Faces Pain Scale: No hurt    Home Living                      Prior Function            PT Goals (current goals can now be found in the care plan section) Progress towards PT goals: Progressing toward goals    Frequency    Min 2X/week      PT Plan Current plan remains appropriate    Co-evaluation PT/OT/SLP Co-Evaluation/Treatment: Yes Reason for Co-Treatment: For  patient/therapist safety PT goals addressed during session: Mobility/safety with mobility        AM-PAC PT "6 Clicks" Mobility   Outcome Measure  Help needed turning from your back to your side while in a flat bed without using bedrails?: Total Help needed moving from lying on your back to sitting on the side of a flat bed without using bedrails?: Total Help needed moving to and from a bed to a chair (including a wheelchair)?: Total Help needed standing up from a chair using your arms (e.g., wheelchair or bedside chair)?: Total Help needed to walk in hospital room?: Total Help needed climbing 3-5 steps with a railing? : Total 6 Click Score: 6    End of Session Equipment Utilized During Treatment: Oxygen Activity Tolerance: Patient tolerated treatment well Patient left: with call bell/phone within reach;with bed alarm set(bed chair position) Nurse  Communication: Mobility status PT Visit Diagnosis: Unsteadiness on feet (R26.81);Difficulty in walking, not elsewhere classified (R26.2)     Time: [id number] PT Time Calculation (min) (ACUTE ONLY): 30 min  Charges:  $Therapeutic Activity: 8-22 mins                     Tresa Endo PT Acute Rehabilitation Services  Office [phone]    Claretha Cooper 01/05/2019, 12:49 PM

## 2019-01-05 NOTE — Progress Notes (Signed)
Occupational Therapy Treatment Patient Details Name: [name] MRN: [id number] DOB: January 26, 1923 Today's Date: 01/05/2019    History of present illness 85 yo female presents to the ED at [facility] with worsening SOB and dark urine. Per EMS Satting low 80s initially, low 90s on NRB in ED. PMH including HFrEF with 25-30% EF, Tachy-brady syndrome s/p PPM, CKD stage 4, COPD, HTN, and arthritis.    OT comments  Pt with gradual progress with therapies, tolerated sitting EOB approx 10 min today during session. Pt requiring two person modA for bed mobility and for sit<>stand at RW (pt declined OOB to recliner today). Pt on 5L HFNC with overall O2 sats maintaining 88-90% or higher; HR briefly up to mid 130s with activity. Pt requiring increased time, repetition and cues for following commands and initiating functional tasks. Feel SNF recommendation remains appropriate at this time. Will continue to follow acutely.    Follow Up Recommendations  SNF;Supervision/Assistance - 24 hour(pending progress, family wants home)    Equipment Recommendations  Other (comment);3 in 1 bedside commode(defer to next venue)          Precautions / Restrictions Precautions Precautions: Fall Precaution Comments: monitor sats and HR Restrictions Weight Bearing Restrictions: No       Mobility Bed Mobility Overal bed mobility: Needs Assistance Bed Mobility: Rolling;Sidelying to Sit;Sit to Supine Rolling: Mod assist;+2 for safety/equipment Sidelying to sit: Mod assist;+2 for safety/equipment   Sit to supine: Mod assist;+2 for safety/equipment   General bed mobility comments: assist with legs and trunk to sit up, pt. placed legs onto bed  to return to suppine with Min Assist.  Transfers Overall transfer level: Needs assistance Equipment used: Rolling walker (2 wheeled) Transfers: Sit to/from Stand Sit to Stand: Mod assist;+2 physical assistance;+2 safety/equipment         General transfer comment: assist to  rise x 2 from bed using RW and bed pad to lift buttocks. Stood briefly x 2. HR max 130, On 5 L HFNC w/ SaO2 >88-90% or higher, frequently no SaO2 reading/    Balance Overall balance assessment: Needs assistance;History of Falls Sitting-balance support: No upper extremity supported Sitting balance-Leahy Scale: Fair Sitting balance - Comments: sat on bed edge x 8 minutes at least                                   ADL either performed or assessed with clinical judgement   ADL Overall ADL's : Needs assistance/impaired Eating/Feeding: Minimal assistance;Sitting Eating/Feeding Details (indicate cue type and reason): drinking from cup with minA to stabilize in hand Grooming: Wash/dry face;Minimal assistance;Sitting Grooming Details (indicate cue type and reason): cues to initiate and carry out taks                             Functional mobility during ADLs: Moderate assistance;+2 for physical assistance;+2 for safety/equipment(bed mobility only) General ADL Comments: pt tolerating sitting EOB approx 10 min during session and practicing sit<>stand x2; continues to present with cognitive impairments, weakness     Vision       Perception     Praxis      Cognition Arousal/Alertness: Awake/alert Behavior During Therapy: WFL for tasks assessed/performed Overall Cognitive Status: Difficult to assess Area of Impairment: Attention;Following commands;Awareness                   Current Attention Level:  Divided   Following Commands: Follows one step commands inconsistently;Follows one step commands with increased time   Awareness: Intellectual   General Comments: at times noted to be just staring out. when stimulated, pt. responds.        Exercises Other Exercises Other Exercises: pt swinging legs while sitting.   Shoulder Instructions       General Comments      Pertinent Vitals/ Pain       Pain Assessment: Faces Faces Pain Scale: No  hurt  Home Living                                          Prior Functioning/Environment              Frequency  Min 3X/week        Progress Toward Goals  OT Goals(current goals can now be found in the care plan section)  Progress towards OT goals: Progressing toward goals  Acute Rehab OT Goals Patient Stated Goal: agreeable to sitting EOB OT Goal Formulation: With patient Time For Goal Achievement: 01/16/19 Potential to Achieve Goals: Good  Plan Discharge plan remains appropriate    Co-evaluation    PT/OT/SLP Co-Evaluation/Treatment: Yes Reason for Co-Treatment: Complexity of the patient's impairments (multi-system involvement);For patient/therapist safety PT goals addressed during session: Mobility/safety with mobility        AM-PAC OT "6 Clicks" Daily Activity     Outcome Measure   Help from another person eating meals?: A Little Help from another person taking care of personal grooming?: A Little Help from another person toileting, which includes using toliet, bedpan, or urinal?: A Lot Help from another person bathing (including washing, rinsing, drying)?: A Lot Help from another person to put on and taking off regular upper body clothing?: A Lot Help from another person to put on and taking off regular lower body clothing?: A Lot 6 Click Score: 14    End of Session Equipment Utilized During Treatment: Rolling walker;Oxygen  OT Visit Diagnosis: Unsteadiness on feet (R26.81);Other abnormalities of gait and mobility (R26.89);Muscle weakness (generalized) (M62.81);Other symptoms and signs involving cognitive function   Activity Tolerance Patient tolerated treatment well;Patient limited by fatigue   Patient Left in bed;with call bell/phone within reach;with bed alarm set   Nurse Communication Mobility status        Time: [id number] OT Time Calculation (min): 31 min  Charges: OT General Charges $OT Visit: 1 Visit OT  Treatments $Self Care/Home Management : 8-22 mins  Lou Cal, [place] Pager [phone] Office [place] 01/05/2019, 1:01 PM

## 2019-01-05 NOTE — Progress Notes (Signed)
[facility] PROGRESS NOTE    Progress Note  [name]  [id number] DOB: 1922/10/06 DOA: 01/01/2019 PCP: [name], MD     Brief Narrative:   [name] is an 85 y.o. female past medical history significant for chronic systolic heart failure with an EF of 25%, tachybradycardia syndrome status post pacemaker which needs battery replacement in the near future though they are trying to delay given her age, chronic kidney disease stage IV with a baseline creatinine around 2.  Came to [facility] with worsening shortness of breath and dark urine was noted to be hypoxic requiring nonrebreather and acute renal failure with a creatinine like around 6.  Assessment/Plan:   Acute respiratory failure with hypoxia (HCC) due to  Acute respiratory disease due to COVID-19 virus/Acute on chronic systolic CHF (congestive heart failure) [facility]): Patient has multiple risk factors for ARDS and multiorgan failure.   She is currently requiring 6  L of high flow nasal cannula to keep saturations above 95%. She was started on IV Remdesivir and IV steroids and 12/26/2018.  She has completed her treatment of IV Remdesivir continue IV Solu-Medrol. Her inflammatory markers are slowly improving. She was given an IV Lasix on admission. I have personally spoke with the family and ask explained to their the extremely poor prognosis she has we will continue gentle medical treatments. The family agreed with no heroics she remains a DNR/DNI.  Acute kidney failure (HCC) on chronic kidney disease stage IV: With a baseline creatinine like around 2.  On admission her creatinine was 5.1. Her creatinine is improving today. We will resume D5W continue to monitor creatinine slowly.  New hypernatremia: Likely due to hypovolemia. We will start her on D5 DW.  History of tachybradycardia syndrome with pacemaker: Per family cardiology is delaying battery change due to her advanced age and possible poor condition and  life expectancy.  Essential hypertension: Blood pressure is fairly controlled.  Acute metabolic encephalopathy Improved with supportive care. Physical therapy was consulted  Pressure injury of skin RN Pressure Injury Documentation: Pressure Injury 01/04/19 Coccyx Stage II -  Partial thickness loss of dermis presenting as a shallow open ulcer with a red, pink wound bed without slough. (Active)  01/04/19 0612  Location: Coccyx  Location Orientation:   Staging: Stage II -  Partial thickness loss of dermis presenting as a shallow open ulcer with a red, pink wound bed without slough.  Wound Description (Comments):   Present on Admission:     DVT prophylaxis: lovenox Family Communication:gransdaughter Disposition Plan/Barrier to D/C:once off oxygen Code Status:     Code Status Orders  (From admission, onward)         Start     Ordered   01/01/19 1004  Do not attempt resuscitation (DNR)  Continuous    Question Answer Comment  In the event of cardiac or respiratory ARREST Do not call a "code blue"   In the event of cardiac or respiratory ARREST Do not perform Intubation, CPR, defibrillation or ACLS   In the event of cardiac or respiratory ARREST Use medication by any route, position, wound care, and other measures to relive pain and suffering. May use oxygen, suction and manual treatment of airway obstruction as needed for comfort.      01/01/19 1003        Code Status History    Date Active Date Inactive Code Status Order ID Comments User Context   12/24/2018 2300 01/01/2019 1003 Full Code [id number]  Alcario Drought,  [place] Care, DO Inpatient   Advance Care Planning Activity        IV Access:    Peripheral IV   Procedures and diagnostic studies:   No results found.   Medical Consultants:    None.  Anti-Infectives:   Remdesivir  Subjective:    [name] she has no new complaints  Objective:    Vitals:   01/04/19 1914 01/04/19 1945 01/05/19 0515 01/05/19  0717  BP:  109/86 117/72 123/79  Pulse:  (!) 108 100 100  Resp:  (!) 21 (!) 26 (!) 24  Temp: 99.1 F (37.3 C) 98.6 F (37 C) (!) 97.5 F (36.4 C) 97.7 F (36.5 C)  TempSrc:  Axillary Axillary Oral  SpO2:  97% 99% 100%   SpO2: 100 % O2 Flow Rate (L/min): 6 L/min   Intake/Output Summary (Last 24 hours) at 01/05/2019 [id number] Last data filed at 01/04/2019 2115 Gross per 24 hour  Intake 500 ml  Output 700 ml  Net -200 ml   There were no vitals filed for this visit.  Exam: General exam: In no acute distress. Respiratory system: Good air movement and clear to auscultation. Cardiovascular system: S1 & S2 heard, RRR. No JVD, . Gastrointestinal system: Abdomen is nondistended, soft and nontender.  Central nervous system: Alert and oriented. No focal neurological deficits. Extremities: No pedal edema. Skin: No rashes, lesions or ulcers Psychiatry: No insight on medical condition.   Data Reviewed:    Labs: Basic Metabolic Panel: Recent Labs  Lab 01/01/19 0830 01/02/19 0215 01/03/19 0230 01/04/19 0235 01/05/19 0140  NA 145 141 145 146* 148*  K 5.2* 4.5 4.4 4.2 4.3  CL 109 106 107 109 111  CO2 20* 23 23 22 22   GLUCOSE 112* 133* 134* 138* 133*  BUN 92* 98* 111* 119* PENDING  CREATININE 4.65* 4.29* 4.06* 3.88* 3.36*  CALCIUM 7.7* 7.3* 7.6* 7.9* 8.3*  MG  --  1.9 2.2 2.3 2.1   GFR Estimated Creatinine Clearance: 11.8 mL/min (A) (by C-G formula based on SCr of 3.36 mg/dL (H)). Liver Function Tests: Recent Labs  Lab 01/01/19 0030 01/02/19 0215 01/03/19 0230 01/04/19 0235 01/05/19 0140  AST 26 21 15 16 15   ALT 14 13 13 13 13   ALKPHOS 57 62 63 65 61  BILITOT 0.8 0.2* 0.4 0.7 0.6  PROT 6.9 6.1* 6.1* 6.4* 6.1*  ALBUMIN 2.5* 2.2* 2.3* 2.5* 2.3*   No results for input(s): LIPASE, AMYLASE in the last 168 hours. Recent Labs  Lab 01/06/2019 1043  AMMONIA <9*   Coagulation profile Recent Labs  Lab 01/07/2019 1017  INR 1.1   COVID-19 Labs  Recent Labs     01/03/19 0230 01/04/19 0235 01/05/19 0140  DDIMER 12.69* 9.98* 7.78*  CRP 15.4* 12.3* 10.1*    Lab Results  Component Value Date   SARSCOV2NAA POSITIVE (A) 12/30/2018    CBC: Recent Labs  Lab 01/01/19 0030 01/02/19 0215 01/03/19 0230 01/04/19 0235 01/05/19 0140  WBC 9.5 11.7* 14.3* 12.4* 9.6  NEUTROABS 8.6* 10.7* 13.1* 11.3* 8.4*  HGB 9.7* 8.8* 9.6* 9.7* 9.0*  HCT 31.4* 28.3* 29.9* 30.2* 28.7*  MCV 94.6 93.4 93.4 92.4 92.6  PLT 192 180 191 191 174   Cardiac Enzymes: No results for input(s): CKTOTAL, CKMB, CKMBINDEX, TROPONINI in the last 168 hours. BNP (last 3 results) No results for input(s): PROBNP in the last 8760 hours. CBG: Recent Labs  Lab 01/04/19 0754  GLUCAP 122*   D-Dimer: Recent Labs  01/04/19 0235 01/05/19 0140  DDIMER 9.98* 7.78*   Hgb A1c: No results for input(s): HGBA1C in the last 72 hours. Lipid Profile: No results for input(s): CHOL, HDL, LDLCALC, TRIG, CHOLHDL, LDLDIRECT in the last 72 hours. Thyroid function studies: No results for input(s): TSH, T4TOTAL, T3FREE, THYROIDAB in the last 72 hours.  Invalid input(s): FREET3 Anemia work up: No results for input(s): VITAMINB12, FOLATE, FERRITIN, TIBC, IRON, RETICCTPCT in the last 72 hours. Sepsis Labs: Recent Labs  Lab 01/10/2019 1017 12/30/2018 1217  01/02/19 0215 01/03/19 0230 01/04/19 0235 01/05/19 0140  WBC 9.5  --    < > 11.7* 14.3* 12.4* 9.6  LATICACIDVEN 2.2* 1.4  --   --   --   --   --    < > = values in this interval not displayed.   Microbiology Recent Results (from the past 240 hour(s))  Culture, blood (Routine x 2)     Status: None   Collection Time: 01/20/2019 10:17 AM   Specimen: BLOOD  Result Value Ref Range Status   Specimen Description BLOOD LEFT ANTECUBITAL  Final   Special Requests   Final    BOTTLES DRAWN AEROBIC AND ANAEROBIC Blood Culture adequate volume   Culture   Final    NO GROWTH 5 DAYS Performed at [facility], [address]  [place], [place] [address]    Report Status 01/05/2019 FINAL  Final  Culture, blood (Routine x 2)     Status: None   Collection Time: 12/30/2018 10:28 AM   Specimen: BLOOD  Result Value Ref Range Status   Specimen Description BLOOD R HAND  Final   Special Requests   Final    BOTTLES DRAWN AEROBIC AND ANAEROBIC Blood Culture adequate volume   Culture   Final    NO GROWTH 5 DAYS Performed at [facility], [address] [place], [place] [address]    Report Status 01/05/2019 FINAL  Final  SARS Coronavirus 2 [facility] order, Performed in [facility] hospital lab) Nasopharyngeal Nasopharyngeal Swab     Status: Abnormal   Collection Time: 01/11/2019 10:42 AM   Specimen: Nasopharyngeal Swab  Result Value Ref Range Status   SARS Coronavirus 2 POSITIVE (A) NEGATIVE Final    Comment: RESULT CALLED TO, READ BACK BY AND VERIFIED WITH:  Nadyne Coombes AT [id number] 01/14/2019 SDR (NOTE) If result is NEGATIVE SARS-CoV-2 target nucleic acids are NOT DETECTED. The SARS-CoV-2 RNA is generally detectable in upper and lower  respiratory specimens during the acute phase of infection. The lowest  concentration of SARS-CoV-2 viral copies this assay can detect is 250  copies / mL. A negative result does not preclude SARS-CoV-2 infection  and should not be used as the sole basis for treatment or other  patient management decisions.  A negative result may occur with  improper specimen collection / handling, submission of specimen other  than nasopharyngeal swab, presence of viral mutation(s) within the  areas targeted by this assay, and inadequate number of viral copies  (<250 copies / mL). A negative result must be combined with clinical  observations, patient history, and epidemiological information. If result is POSITIVE SARS-CoV-2 target nucleic acids are DETECTED.  The SARS-CoV-2 RNA is generally detectable in upper and lower  respiratory specimens during the acute phase of infection.  Positive   results are indicative of active infection with SARS-CoV-2.  Clinical  correlation with patient history and other diagnostic information is  necessary to determine patient infection status.  Positive results do  not rule out bacterial infection or co-infection with other viruses. If result is PRESUMPTIVE POSTIVE SARS-CoV-2 nucleic acids MAY BE PRESENT.   A presumptive positive result was obtained on the submitted specimen  and confirmed on repeat testing.  While 2019 novel coronavirus  (SARS-CoV-2) nucleic acids may be present in the submitted sample  additional confirmatory testing may be necessary for epidemiological  and / or clinical management purposes  to differentiate between  SARS-CoV-2 and other Sarbecovirus currently known to infect humans.  If clinically indicated additional testing with an alternate test  methodology [phone]) i s advised. The SARS-CoV-2 RNA is generally  detectable in upper and lower respiratory specimens during the acute  phase of infection. The expected result is Negative. Fact Sheet for Patients:  StrictlyIdeas.no Fact Sheet for Healthcare Providers: BankingDealers.co.za This test is not yet approved or cleared by the [place] FDA and has been authorized for detection and/or diagnosis of SARS-CoV-2 by FDA under an Emergency Use Authorization (EUA).  This EUA will remain in effect (meaning this test can be used) for the duration of the COVID-19 declaration under Section 564(b)(1) of the Act, 21 U.S.C. section 360bbb-3(b)(1), unless the authorization is terminated or revoked sooner. Performed at [facility], [address] [place], [place] [address]      Medications:   . allopurinol  100 mg Oral Daily  . chlorhexidine  15 mL Mouth Rinse BID  . citalopram  10 mg Oral Daily  . heparin injection (subcutaneous)  7,500 Units Subcutaneous Q8H  . mouth rinse  15 mL Mouth Rinse q12n4p  .  methylPREDNISolone (SOLU-MEDROL) injection  60 mg Intravenous Q12H  . mometasone-formoterol  2 puff Inhalation BID  . umeclidinium bromide  1 puff Inhalation Daily   Continuous Infusions:     LOS: 5 days   Charlynne Cousins  [facility]  01/05/2019, 8:33 AM

## 2019-01-05 NOTE — Progress Notes (Signed)
Tele notified writer oxygen sat reading 60's with good wave form, writer went to room and observed [place] laying on bed, HFNC reapplied and increased to 10L, patient sats  slowly increased to 90%. Patient resting in bed with eyes closed. Will continue to monitor.

## 2019-01-05 NOTE — Care Management Important Message (Signed)
Important Message  Patient Details  Name: [name] MRN: [id number] Date of Birth: 1923-01-07   Medicare Important Message Given:  Yes - Important Message mailed due to current National Emergency   Verbal consent obtained due to current National Emergency  Relationship to patient: [place] Name: Virjinia Mantey Call Date: 01/05/19  Time: [id number] Phone: [phone] Outcome: Spoke with contact Important Message mailed to: Patient address on file      [name] [address] 01/05/2019, 2:16 PM

## 2019-01-06 LAB — BASIC METABOLIC PANEL
Anion gap: 10 (ref 5–15)
BUN: 120 mg/dL — ABNORMAL HIGH (ref 8–23)
CO2: 24 mmol/L (ref 22–32)
Calcium: 7.9 mg/dL — ABNORMAL LOW (ref 8.9–10.3)
Chloride: 110 mmol/L (ref 98–111)
Creatinine, Ser: 3.35 mg/dL — ABNORMAL HIGH (ref 0.44–1.00)
GFR calc Af Amer: 13 mL/min — ABNORMAL LOW (ref >60)
GFR calc non Af Amer: 11 mL/min — ABNORMAL LOW (ref >60)
Glucose, Bld: 167 mg/dL — ABNORMAL HIGH (ref 70–99)
Potassium: 4.5 mmol/L (ref 3.5–5.1)
Sodium: 144 mmol/L (ref 135–145)

## 2019-01-06 LAB — C-REACTIVE PROTEIN: CRP: 6.5 mg/dL — ABNORMAL HIGH (ref <1.0)

## 2019-01-06 LAB — D-DIMER, QUANTITATIVE: D-Dimer, Quant: 10.46 ug/mL-FEU — ABNORMAL HIGH (ref 0.00–0.50)

## 2019-01-06 NOTE — Plan of Care (Signed)
  Problem: Coping: Goal: Psychosocial and spiritual needs will be supported Outcome: Progressing   Problem: Respiratory: Goal: Will maintain a patent airway Outcome: Progressing Goal: Complications related to the disease process, condition or treatment will be avoided or minimized Outcome: Progressing   Problem: Clinical Measurements: Goal: Diagnostic test results will improve Outcome: Progressing Goal: Respiratory complications will improve Outcome: Progressing   Problem: Activity: Goal: Risk for activity intolerance will decrease Outcome: Progressing   Problem: Safety: Goal: Ability to remain free from injury will improve Outcome: Progressing   Problem: Skin Integrity: Goal: Risk for impaired skin integrity will decrease Outcome: Progressing   Problem: Nutrition: Goal: Adequate nutrition will be maintained Outcome: Not Progressing Note: Poor appetite

## 2019-01-06 NOTE — Progress Notes (Addendum)
0815  Resting in bed.  Responds to touch.  No verbal response.  O2 sat 100% on 15L/HFNC.  Decreased O2 to 12L.    0945  Sats 84-86% on 12L/HFNC.  Increased to 15L  1405  Called and updated granddaughter [name] on patient condition.  All questions/concers answered.

## 2019-01-06 NOTE — Progress Notes (Addendum)
[facility] PROGRESS NOTE    Progress Note  [name]  [id number] DOB: 1922-11-10 DOA: 01/10/2019 PCP: [name], MD     Brief Narrative:   [name] is an 85 y.o. female past medical history significant for chronic systolic heart failure with an EF of 25%, tachybradycardia syndrome status post pacemaker which needs battery replacement in the near future though they are trying to delay given her age, chronic kidney disease stage IV with a baseline creatinine around 2.  Came to [facility] with worsening shortness of breath and dark urine was noted to be hypoxic requiring nonrebreather and acute renal failure with a creatinine like around 6.  Assessment/Plan:   Acute respiratory failure with hypoxia (HCC) due to  Acute respiratory disease due to COVID-19 virus/Acute on chronic systolic CHF (congestive heart failure) [facility]): Patient has multiple risk factors for ARDS and multiorgan failure.   She is now requiring 15 L of high flow nasal cannula to keep saturations above 95%. She has completed her course of IV Remdesivir, now on oral dexamethasone. Her inflammatory markers are slowly improving. Spoke to the family and explained the extreme poor prognosis  I have spoken to the family and told him that she is requiring more oxygen than the previous days.  Acute kidney failure (HCC) on chronic kidney disease stage IV: With a baseline creatinine like around 2.  On admission her creatinine was 5.1. Basic metabolic panel is pending today, continue D5W  New hypernatremia: Likely due to hypovolemia. KVO IV fluids.  History of tachybradycardia syndrome with pacemaker: Per family cardiology is delaying battery change due to her advanced age and possible poor condition and life expectancy. Continue oral low-dose metoprolol and IV metoprolol PRN. With good control of her HR.  Essential hypertension: Blood pressure is fairly controlled.  Acute metabolic  encephalopathy: Improved with supportive care. Physical therapy was consulted  Pressure injury of skin RN Pressure Injury Documentation: Pressure Injury 01/04/19 Coccyx Stage II -  Partial thickness loss of dermis presenting as a shallow open ulcer with a red, pink wound bed without slough. (Active)  01/04/19 0612  Location: Coccyx  Location Orientation:   Staging: Stage II -  Partial thickness loss of dermis presenting as a shallow open ulcer with a red, pink wound bed without slough.  Wound Description (Comments):   Present on Admission:     DVT prophylaxis: lovenox Family Communication:gransdaughter Disposition Plan/Barrier to D/C:once off oxygen Code Status:     Code Status Orders  (From admission, onward)         Start     Ordered   01/01/19 1004  Do not attempt resuscitation (DNR)  Continuous    Question Answer Comment  In the event of cardiac or respiratory ARREST Do not call a "code blue"   In the event of cardiac or respiratory ARREST Do not perform Intubation, CPR, defibrillation or ACLS   In the event of cardiac or respiratory ARREST Use medication by any route, position, wound care, and other measures to relive pain and suffering. May use oxygen, suction and manual treatment of airway obstruction as needed for comfort.      01/01/19 1003        Code Status History    Date Active Date Inactive Code Status Order ID Comments User Context   01/07/2019 2300 01/01/2019 1003 Full Code [id number]  Etta Quill, DO Inpatient   Advance Care Planning Activity        IV Access:  Peripheral IV   Procedures and diagnostic studies:   No results found.   Medical Consultants:    None.  Anti-Infectives:   Remdesivir  Subjective:    [name] nonverbal today.  Objective:    Vitals:   01/05/19 1200 01/05/19 1533 01/05/19 2100 01/06/19 0549  BP: 101/67 116/88 113/87 120/76  Pulse: (!) 112 (!) 108    Resp: 16 (!) 27    Temp: 98 F (36.7 C)  98.4 F (36.9 C) 97.8 F (36.6 C) 97.6 F (36.4 C)  TempSrc: Oral Axillary Oral Oral  SpO2: 93% 90%     SpO2: 90 % O2 Flow Rate (L/min): 15 L/min   Intake/Output Summary (Last 24 hours) at 01/06/2019 0748 Last data filed at 01/06/2019 0600 Gross per 24 hour  Intake 1511.83 ml  Output 1150 ml  Net 361.83 ml   There were no vitals filed for this visit.  Exam: General exam: In no acute distress. Respiratory system: Good air movement and diffuse crackles bilaterally. Cardiovascular system: S1 & S2 heard, RRR. No JVD. Gastrointestinal system: Abdomen is nondistended, soft and nontender.  Central nervous system: Alert and oriented. No focal neurological deficits. Extremities: No pedal edema. Skin: No rashes, lesions or ulcers  Data Reviewed:    Labs: Basic Metabolic Panel: Recent Labs  Lab 01/01/19 0830 01/02/19 0215 01/03/19 0230 01/04/19 0235 01/05/19 0140  NA 145 141 145 146* 148*  K 5.2* 4.5 4.4 4.2 4.3  CL 109 106 107 109 111  CO2 20* 23 23 22 22   GLUCOSE 112* 133* 134* 138* 133*  BUN 92* 98* 111* 119* 120*  CREATININE 4.65* 4.29* 4.06* 3.88* 3.36*  CALCIUM 7.7* 7.3* 7.6* 7.9* 8.3*  MG  --  1.9 2.2 2.3 2.1   GFR Estimated Creatinine Clearance: 11.8 mL/min (A) (by C-G formula based on SCr of 3.36 mg/dL (H)). Liver Function Tests: Recent Labs  Lab 01/01/19 0030 01/02/19 0215 01/03/19 0230 01/04/19 0235 01/05/19 0140  AST 26 21 15 16 15   ALT 14 13 13 13 13   ALKPHOS 57 62 63 65 61  BILITOT 0.8 0.2* 0.4 0.7 0.6  PROT 6.9 6.1* 6.1* 6.4* 6.1*  ALBUMIN 2.5* 2.2* 2.3* 2.5* 2.3*   No results for input(s): LIPASE, AMYLASE in the last 168 hours. Recent Labs  Lab 01/19/2019 1043  AMMONIA <9*   Coagulation profile Recent Labs  Lab 01/15/2019 1017  INR 1.1   COVID-19 Labs  Recent Labs    01/04/19 0235 01/05/19 0140  DDIMER 9.98* 7.78*  CRP 12.3* 10.1*    Lab Results  Component Value Date   SARSCOV2NAA POSITIVE (A) 01/04/2019    CBC: Recent  Labs  Lab 01/01/19 0030 01/02/19 0215 01/03/19 0230 01/04/19 0235 01/05/19 0140  WBC 9.5 11.7* 14.3* 12.4* 9.6  NEUTROABS 8.6* 10.7* 13.1* 11.3* 8.4*  HGB 9.7* 8.8* 9.6* 9.7* 9.0*  HCT 31.4* 28.3* 29.9* 30.2* 28.7*  MCV 94.6 93.4 93.4 92.4 92.6  PLT 192 180 191 191 174   Cardiac Enzymes: No results for input(s): CKTOTAL, CKMB, CKMBINDEX, TROPONINI in the last 168 hours. BNP (last 3 results) No results for input(s): PROBNP in the last 8760 hours. CBG: Recent Labs  Lab 01/04/19 0754  GLUCAP 122*   D-Dimer: Recent Labs    01/04/19 0235 01/05/19 0140  DDIMER 9.98* 7.78*   Hgb A1c: No results for input(s): HGBA1C in the last 72 hours. Lipid Profile: No results for input(s): CHOL, HDL, LDLCALC, TRIG, CHOLHDL, LDLDIRECT in the last 72 hours.  Thyroid function studies: No results for input(s): TSH, T4TOTAL, T3FREE, THYROIDAB in the last 72 hours.  Invalid input(s): FREET3 Anemia work up: No results for input(s): VITAMINB12, FOLATE, FERRITIN, TIBC, IRON, RETICCTPCT in the last 72 hours. Sepsis Labs: Recent Labs  Lab 01/04/2019 1017 01/17/2019 1217  01/02/19 0215 01/03/19 0230 01/04/19 0235 01/05/19 0140  WBC 9.5  --    < > 11.7* 14.3* 12.4* 9.6  LATICACIDVEN 2.2* 1.4  --   --   --   --   --    < > = values in this interval not displayed.   Microbiology Recent Results (from the past 240 hour(s))  Culture, blood (Routine x 2)     Status: None   Collection Time: 12/30/2018 10:17 AM   Specimen: BLOOD  Result Value Ref Range Status   Specimen Description BLOOD LEFT ANTECUBITAL  Final   Special Requests   Final    BOTTLES DRAWN AEROBIC AND ANAEROBIC Blood Culture adequate volume   Culture   Final    NO GROWTH 5 DAYS Performed at [facility], [address], [place], [place] [address]    Report Status 01/05/2019 FINAL  Final  Culture, blood (Routine x 2)     Status: None   Collection Time: 01/14/2019 10:28 AM   Specimen: BLOOD  Result Value Ref Range Status    Specimen Description BLOOD R HAND  Final   Special Requests   Final    BOTTLES DRAWN AEROBIC AND ANAEROBIC Blood Culture adequate volume   Culture   Final    NO GROWTH 5 DAYS Performed at [facility], [address], [place], [place] [address]    Report Status 01/05/2019 FINAL  Final  SARS Coronavirus 2 [facility] order, Performed in [facility] hospital lab) Nasopharyngeal Nasopharyngeal Swab     Status: Abnormal   Collection Time: 12/26/2018 10:42 AM   Specimen: Nasopharyngeal Swab  Result Value Ref Range Status   SARS Coronavirus 2 POSITIVE (A) NEGATIVE Final    Comment: RESULT CALLED TO, READ BACK BY AND VERIFIED WITH:  Nadyne Coombes AT [id number] 01/11/2019 SDR (NOTE) If result is NEGATIVE SARS-CoV-2 target nucleic acids are NOT DETECTED. The SARS-CoV-2 RNA is generally detectable in upper and lower  respiratory specimens during the acute phase of infection. The lowest  concentration of SARS-CoV-2 viral copies this assay can detect is 250  copies / mL. A negative result does not preclude SARS-CoV-2 infection  and should not be used as the sole basis for treatment or other  patient management decisions.  A negative result may occur with  improper specimen collection / handling, submission of specimen other  than nasopharyngeal swab, presence of viral mutation(s) within the  areas targeted by this assay, and inadequate number of viral copies  (<250 copies / mL). A negative result must be combined with clinical  observations, patient history, and epidemiological information. If result is POSITIVE SARS-CoV-2 target nucleic acids are DETECTED.  The SARS-CoV-2 RNA is generally detectable in upper and lower  respiratory specimens during the acute phase of infection.  Positive  results are indicative of active infection with SARS-CoV-2.  Clinical  correlation with patient history and other diagnostic information is  necessary to determine patient infection status.  Positive results  do  not rule out bacterial infection or co-infection with other viruses. If result is PRESUMPTIVE POSTIVE SARS-CoV-2 nucleic acids MAY BE PRESENT.   A presumptive positive result was obtained on the submitted specimen  and confirmed on repeat  testing.  While 2019 novel coronavirus  (SARS-CoV-2) nucleic acids may be present in the submitted sample  additional confirmatory testing may be necessary for epidemiological  and / or clinical management purposes  to differentiate between  SARS-CoV-2 and other Sarbecovirus currently known to infect humans.  If clinically indicated additional testing with an alternate test  methodology [phone]) i s advised. The SARS-CoV-2 RNA is generally  detectable in upper and lower respiratory specimens during the acute  phase of infection. The expected result is Negative. Fact Sheet for Patients:  StrictlyIdeas.no Fact Sheet for Healthcare Providers: BankingDealers.co.za This test is not yet approved or cleared by the [place] FDA and has been authorized for detection and/or diagnosis of SARS-CoV-2 by FDA under an Emergency Use Authorization (EUA).  This EUA will remain in effect (meaning this test can be used) for the duration of the COVID-19 declaration under Section 564(b)(1) of the Act, 21 U.S.C. section 360bbb-3(b)(1), unless the authorization is terminated or revoked sooner. Performed at [facility], [address] [place], [place] [address]      Medications:   . allopurinol  100 mg Oral Daily  . chlorhexidine  15 mL Mouth Rinse BID  . citalopram  10 mg Oral Daily  . heparin injection (subcutaneous)  7,500 Units Subcutaneous Q8H  . mouth rinse  15 mL Mouth Rinse q12n4p  . methylPREDNISolone (SOLU-MEDROL) injection  40 mg Intravenous Q12H  . metoprolol tartrate  12.5 mg Oral BID  . mometasone-formoterol  2 puff Inhalation BID  . umeclidinium bromide  1 puff Inhalation Daily    Continuous Infusions: . dextrose 50 mL/hr at 01/06/19 0400     LOS: 6 days   Charlynne Cousins  [facility]  01/06/2019, 7:48 AM

## 2019-01-07 LAB — BASIC METABOLIC PANEL
Anion gap: 10 (ref 5–15)
BUN: 127 mg/dL — ABNORMAL HIGH (ref 8–23)
CO2: 23 mmol/L (ref 22–32)
Calcium: 7.9 mg/dL — ABNORMAL LOW (ref 8.9–10.3)
Chloride: 108 mmol/L (ref 98–111)
Creatinine, Ser: 3.04 mg/dL — ABNORMAL HIGH (ref 0.44–1.00)
GFR calc Af Amer: 15 mL/min — ABNORMAL LOW (ref >60)
GFR calc non Af Amer: 13 mL/min — ABNORMAL LOW (ref >60)
Glucose, Bld: 162 mg/dL — ABNORMAL HIGH (ref 70–99)
Potassium: 4.4 mmol/L (ref 3.5–5.1)
Sodium: 141 mmol/L (ref 135–145)

## 2019-01-07 MED ORDER — MORPHINE SULFATE (PF) 2 MG/ML IV SOLN
4.0000 mg | Freq: Once | INTRAVENOUS | Status: AC
  Administered 2019-01-07: 4 mg via INTRAVENOUS

## 2019-01-07 MED ORDER — MORPHINE 100MG IN NS 100ML (1MG/ML) PREMIX INFUSION
4.0000 mg/h | INTRAVENOUS | Status: DC
  Administered 2019-01-07: 1 mg/h via INTRAVENOUS
  Administered 2019-01-09: 2 mg/h via INTRAVENOUS
  Filled 2019-01-07 (×2): qty 100

## 2019-01-07 NOTE — Plan of Care (Signed)
  Problem: Respiratory: Goal: Will maintain a patent airway Outcome: Progressing   Problem: Safety: Goal: Ability to remain free from injury will improve Outcome: Progressing   Problem: Skin Integrity: Goal: Risk for impaired skin integrity will decrease Outcome: Progressing

## 2019-01-07 NOTE — Progress Notes (Addendum)
1125  Resting in bed.  Resp e/u.  On 15L/HFNC.  sats 84-94%.  Morphine bolus given and drip initiated.    1630  Resting comfortably in bed.  Resp e/u.  O2 at 15L.  Morphine at 1mg /hr infusing.  Transported to room 72 for family visit.    1706  End of family visit.  Transported back to room.  Ate a few bites of dinner and approximately  240 ml water.  O2 sat 88-92% at this time on 15L.  Morphine infusing.

## 2019-01-07 NOTE — Progress Notes (Signed)
Patient sats ranging bet 81-92% on 15lHF. Dr Shanon Brow paged with finding.

## 2019-01-07 NOTE — Progress Notes (Signed)
[facility] PROGRESS NOTE    Progress Note  [name]  [id number] DOB: 25-Jul-1922 DOA: 01/12/2019 PCP: [name], MD     Brief Narrative:   [name] is an 85 y.o. female past medical history significant for chronic systolic heart failure with an EF of 25%, tachybradycardia syndrome status post pacemaker which needs battery replacement in the near future though they are trying to delay given her age, chronic kidney disease stage IV with a baseline creatinine around 2.  Came to [facility] with worsening shortness of breath and dark urine was noted to be hypoxic requiring nonrebreather and acute renal failure with a creatinine like around 6.  Assessment/Plan:   Acute respiratory failure with hypoxia (HCC) due to  Acute respiratory disease due to COVID-19 virus/Acute on chronic systolic CHF (congestive heart failure) [facility]): Patient has multiple risk factors for ARDS and multiorgan failure.   She is requiring 15 L of high flow nasal cannula and her saturations have been going as low as 78%. She has completed her treatment of IV Remdesivir and oral dexamethasone. She has an extremely poor prognosis I have spoken to the family they have agreed to move towards comfort care. They would like all the medications and labs stopped, they will also like to start her on a morphine drip to help with the air hunger.  Acute kidney failure (HCC) on chronic kidney disease stage IV: With a baseline creatinine around 2, likely prerenal azotemia improved with IV fluid hydration. Now is 3.0.  New hypovolemic hypernatremia: Improved with D5W.  History of tachybradycardia syndrome with pacemaker: Per family cardiology is delaying battery change due to her advanced age and possible poor condition and life expectancy. Continue oral low-dose metoprolol and IV metoprolol PRN. With good control of her HR.  Essential hypertension: Blood pressure is fairly controlled.  Acute metabolic  encephalopathy: Improved with supportive care. Physical therapy was consulted  Pressure injury of skin RN Pressure Injury Documentation: Pressure Injury 01/04/19 Coccyx Stage II -  Partial thickness loss of dermis presenting as a shallow open ulcer with a red, pink wound bed without slough. (Active)  01/04/19 0612  Location: Coccyx  Location Orientation:   Staging: Stage II -  Partial thickness loss of dermis presenting as a shallow open ulcer with a red, pink wound bed without slough.  Wound Description (Comments):   Present on Admission:     DVT prophylaxis: lovenox Family Communication:gransdaughter Disposition Plan/Barrier to D/C: She will probably pass away in the hospital. Code Status:     Code Status Orders  (From admission, onward)         Start     Ordered   01/01/19 1004  Do not attempt resuscitation (DNR)  Continuous    Question Answer Comment  In the event of cardiac or respiratory ARREST Do not call a "code blue"   In the event of cardiac or respiratory ARREST Do not perform Intubation, CPR, defibrillation or ACLS   In the event of cardiac or respiratory ARREST Use medication by any route, position, wound care, and other measures to relive pain and suffering. May use oxygen, suction and manual treatment of airway obstruction as needed for comfort.      01/01/19 1003        Code Status History    Date Active Date Inactive Code Status Order ID Comments User Context   01/11/2019 2300 01/01/2019 1003 Full Code [id number]  Etta Quill, DO Inpatient   Advance Care Planning  Activity        IV Access:    Peripheral IV   Procedures and diagnostic studies:   No results found.   Medical Consultants:    None.  Anti-Infectives:   Remdesivir  Subjective:    [name] nonverbal today.  Objective:    Vitals:   01/06/19 0950 01/06/19 1647 01/07/19 0000 01/07/19 0400  BP:  (!) 122/106    Pulse: (!) 105 (!) 107    Resp: 20 18    Temp:   98.6 F (37 C) 97.8 F (36.6 C) 98 F (36.7 C)  TempSrc:   Oral   SpO2: 99% 100%     SpO2: 100 % O2 Flow Rate (L/min): 15 L/min   Intake/Output Summary (Last 24 hours) at 01/07/2019 0842 Last data filed at 01/06/2019 2000 Gross per 24 hour  Intake 991.02 ml  Output 550 ml  Net 441.02 ml   There were no vitals filed for this visit.  Exam: General exam: Patient slightly confused in some distress Respiratory system: Good air movement and diffuse crackles bilaterally Cardiovascular system: S1 & S2 heard, RRR. No JVD.  Gastrointestinal system: Abdomen is nondistended, soft and nontender.  Extremities: No pedal edema. Skin: No rashes, lesions or ulcers  Data Reviewed:    Labs: Basic Metabolic Panel: Recent Labs  Lab 01/02/19 0215 01/03/19 0230 01/04/19 0235 01/05/19 0140 01/06/19 0900 01/07/19 0015  NA 141 145 146* 148* 144 141  K 4.5 4.4 4.2 4.3 4.5 4.4  CL 106 107 109 111 110 108  CO2 23 23 22 22 24 23   GLUCOSE 133* 134* 138* 133* 167* 162*  BUN 98* 111* 119* 120* 120* 127*  CREATININE 4.29* 4.06* 3.88* 3.36* 3.35* 3.04*  CALCIUM 7.3* 7.6* 7.9* 8.3* 7.9* 7.9*  MG 1.9 2.2 2.3 2.1  --   --    GFR Estimated Creatinine Clearance: 13 mL/min (A) (by C-G formula based on SCr of 3.04 mg/dL (H)). Liver Function Tests: Recent Labs  Lab 01/01/19 0030 01/02/19 0215 01/03/19 0230 01/04/19 0235 01/05/19 0140  AST 26 21 15 16 15   ALT 14 13 13 13 13   ALKPHOS 57 62 63 65 61  BILITOT 0.8 0.2* 0.4 0.7 0.6  PROT 6.9 6.1* 6.1* 6.4* 6.1*  ALBUMIN 2.5* 2.2* 2.3* 2.5* 2.3*   No results for input(s): LIPASE, AMYLASE in the last 168 hours. Recent Labs  Lab 12/25/2018 1043  AMMONIA <9*   Coagulation profile Recent Labs  Lab 01/06/2019 1017  INR 1.1   COVID-19 Labs  Recent Labs    01/05/19 0140 01/06/19 0900  DDIMER 7.78* 10.46*  CRP 10.1* 6.5*    Lab Results  Component Value Date   SARSCOV2NAA POSITIVE (A) 01/21/2019    CBC: Recent Labs  Lab 01/01/19 0030  01/02/19 0215 01/03/19 0230 01/04/19 0235 01/05/19 0140  WBC 9.5 11.7* 14.3* 12.4* 9.6  NEUTROABS 8.6* 10.7* 13.1* 11.3* 8.4*  HGB 9.7* 8.8* 9.6* 9.7* 9.0*  HCT 31.4* 28.3* 29.9* 30.2* 28.7*  MCV 94.6 93.4 93.4 92.4 92.6  PLT 192 180 191 191 174   Cardiac Enzymes: No results for input(s): CKTOTAL, CKMB, CKMBINDEX, TROPONINI in the last 168 hours. BNP (last 3 results) No results for input(s): PROBNP in the last 8760 hours. CBG: Recent Labs  Lab 01/04/19 0754  GLUCAP 122*   D-Dimer: Recent Labs    01/05/19 0140 01/06/19 0900  DDIMER 7.78* 10.46*   Hgb A1c: No results for input(s): HGBA1C in the last 72 hours. Lipid  Profile: No results for input(s): CHOL, HDL, LDLCALC, TRIG, CHOLHDL, LDLDIRECT in the last 72 hours. Thyroid function studies: No results for input(s): TSH, T4TOTAL, T3FREE, THYROIDAB in the last 72 hours.  Invalid input(s): FREET3 Anemia work up: No results for input(s): VITAMINB12, FOLATE, FERRITIN, TIBC, IRON, RETICCTPCT in the last 72 hours. Sepsis Labs: Recent Labs  Lab 01/06/2019 1017 12/27/2018 1217  01/02/19 0215 01/03/19 0230 01/04/19 0235 01/05/19 0140  WBC 9.5  --    < > 11.7* 14.3* 12.4* 9.6  LATICACIDVEN 2.2* 1.4  --   --   --   --   --    < > = values in this interval not displayed.   Microbiology Recent Results (from the past 240 hour(s))  Culture, blood (Routine x 2)     Status: None   Collection Time: 12/26/2018 10:17 AM   Specimen: BLOOD  Result Value Ref Range Status   Specimen Description BLOOD LEFT ANTECUBITAL  Final   Special Requests   Final    BOTTLES DRAWN AEROBIC AND ANAEROBIC Blood Culture adequate volume   Culture   Final    NO GROWTH 5 DAYS Performed at [facility], [address], [place], [place] [address]    Report Status 01/05/2019 FINAL  Final  Culture, blood (Routine x 2)     Status: None   Collection Time: 01/08/2019 10:28 AM   Specimen: BLOOD  Result Value Ref Range Status   Specimen Description  BLOOD R HAND  Final   Special Requests   Final    BOTTLES DRAWN AEROBIC AND ANAEROBIC Blood Culture adequate volume   Culture   Final    NO GROWTH 5 DAYS Performed at [facility], [address], [place], [place] [address]    Report Status 01/05/2019 FINAL  Final  SARS Coronavirus 2 [facility] order, Performed in [facility] hospital lab) Nasopharyngeal Nasopharyngeal Swab     Status: Abnormal   Collection Time: 01/08/2019 10:42 AM   Specimen: Nasopharyngeal Swab  Result Value Ref Range Status   SARS Coronavirus 2 POSITIVE (A) NEGATIVE Final    Comment: RESULT CALLED TO, READ BACK BY AND VERIFIED WITH:  Nadyne Coombes AT [id number] 12/28/2018 SDR (NOTE) If result is NEGATIVE SARS-CoV-2 target nucleic acids are NOT DETECTED. The SARS-CoV-2 RNA is generally detectable in upper and lower  respiratory specimens during the acute phase of infection. The lowest  concentration of SARS-CoV-2 viral copies this assay can detect is 250  copies / mL. A negative result does not preclude SARS-CoV-2 infection  and should not be used as the sole basis for treatment or other  patient management decisions.  A negative result may occur with  improper specimen collection / handling, submission of specimen other  than nasopharyngeal swab, presence of viral mutation(s) within the  areas targeted by this assay, and inadequate number of viral copies  (<250 copies / mL). A negative result must be combined with clinical  observations, patient history, and epidemiological information. If result is POSITIVE SARS-CoV-2 target nucleic acids are DETECTED.  The SARS-CoV-2 RNA is generally detectable in upper and lower  respiratory specimens during the acute phase of infection.  Positive  results are indicative of active infection with SARS-CoV-2.  Clinical  correlation with patient history and other diagnostic information is  necessary to determine patient infection status.  Positive results do  not rule out  bacterial infection or co-infection with other viruses. If result is PRESUMPTIVE POSTIVE SARS-CoV-2 nucleic acids MAY BE PRESENT.  A presumptive positive result was obtained on the submitted specimen  and confirmed on repeat testing.  While 2019 novel coronavirus  (SARS-CoV-2) nucleic acids may be present in the submitted sample  additional confirmatory testing may be necessary for epidemiological  and / or clinical management purposes  to differentiate between  SARS-CoV-2 and other Sarbecovirus currently known to infect humans.  If clinically indicated additional testing with an alternate test  methodology [phone]) i s advised. The SARS-CoV-2 RNA is generally  detectable in upper and lower respiratory specimens during the acute  phase of infection. The expected result is Negative. Fact Sheet for Patients:  StrictlyIdeas.no Fact Sheet for Healthcare Providers: BankingDealers.co.za This test is not yet approved or cleared by the [place] FDA and has been authorized for detection and/or diagnosis of SARS-CoV-2 by FDA under an Emergency Use Authorization (EUA).  This EUA will remain in effect (meaning this test can be used) for the duration of the COVID-19 declaration under Section 564(b)(1) of the Act, 21 U.S.C. section 360bbb-3(b)(1), unless the authorization is terminated or revoked sooner. Performed at [facility], [address] [place], [place] [address]      Medications:   . allopurinol  100 mg Oral Daily  . chlorhexidine  15 mL Mouth Rinse BID  . citalopram  10 mg Oral Daily  . heparin injection (subcutaneous)  7,500 Units Subcutaneous Q8H  . mouth rinse  15 mL Mouth Rinse q12n4p  . methylPREDNISolone (SOLU-MEDROL) injection  40 mg Intravenous Q12H  . metoprolol tartrate  12.5 mg Oral BID  . mometasone-formoterol  2 puff Inhalation BID  . umeclidinium bromide  1 puff Inhalation Daily   Continuous  Infusions:    LOS: 7 days   Charlynne Cousins  [facility]  01/07/2019, 8:42 AM

## 2019-01-07 NOTE — Progress Notes (Signed)
Spoke with provider( Dr Shanon Brow) in regard to SATs ranging bet 80-92%. No new orders given. She states leave patient at current level of O2. Patient appears non-labored,  Calm, RR ranging bet 18-22. I will continue to monitor patient

## 2019-01-08 NOTE — Plan of Care (Signed)
  Problem: Coping: Goal: Psychosocial and spiritual needs will be supported Outcome: Progressing   Problem: Respiratory: Goal: Will maintain a patent airway Outcome: Progressing   Problem: Pain Managment: Goal: General experience of comfort will improve Outcome: Progressing   Problem: Safety: Goal: Ability to remain free from injury will improve Outcome: Progressing   Problem: Skin Integrity: Goal: Risk for impaired skin integrity will decrease Outcome: Progressing

## 2019-01-08 NOTE — Progress Notes (Addendum)
0800  Resting comfortably in bed.  No verbal response.   Resp e/u. On 15L/HFNC.    0907  Increased Morphine for comfort per orders.    [place] and updated granddaughter, Estill Bamberg over the phone.  All questions/concerns answered.    1800  Resting in bed with eyes closed.  Awakens to touch.  Less responsive this shift.  Opens eyes, but no verbal response.  Not interactive.  Resp e/u.  On 15L/HFNC.  O2 sats 93%.  Morphine infusing @2ml /hr.

## 2019-01-08 NOTE — Progress Notes (Signed)
[facility] PROGRESS NOTE    Progress Note  [name]  [id number] DOB: 1922/08/24 DOA: 01/14/2019 PCP: [name], MD     Brief Narrative:   [name] is an 85 y.o. female past medical history significant for chronic systolic heart failure with an EF of 25%, tachybradycardia syndrome status post pacemaker which needs battery replacement in the near future though they are trying to delay given her age, chronic kidney disease stage IV with a baseline creatinine around 2.  Came to [facility] with worsening shortness of breath and dark urine was noted to be hypoxic requiring nonrebreather and acute renal failure with a creatinine like around 6.  Assessment/Plan:   Acute respiratory failure with hypoxia (HCC) due to  Acute respiratory disease due to COVID-19 virus/Acute on chronic systolic CHF (congestive heart failure) [facility]): Patient has multiple risk factors for ARDS and multiorgan failure.   She is requiring 15 L of high flow nasal cannula try to keep saturations above 80%. She has completed her treatment of IV Remdesivir and oral dexamethasone. She has an extremely poor prognosis I have spoken to the family they have agreed to move towards comfort care. We are now on comfort care mode.  Acute kidney failure (HCC) on chronic kidney disease stage IV: With a baseline creatinine around 2, likely prerenal azotemia improved with IV fluid hydration. Now is 3.0.  New hypovolemic hypernatremia: Improved with D5W.  History of tachybradycardia syndrome with pacemaker: Per family cardiology is delaying battery change due to her advanced age and possible poor condition and life expectancy. Continue oral low-dose metoprolol and IV metoprolol PRN. With good control of her HR.  Essential hypertension: Blood pressure is fairly controlled.  Acute metabolic encephalopathy: Improved with supportive care. Physical therapy was consulted  Pressure injury of skin RN Pressure  Injury Documentation: Pressure Injury 01/04/19 Coccyx Stage II -  Partial thickness loss of dermis presenting as a shallow open ulcer with a red, pink wound bed without slough. (Active)  01/04/19 0612  Location: Coccyx  Location Orientation:   Staging: Stage II -  Partial thickness loss of dermis presenting as a shallow open ulcer with a red, pink wound bed without slough.  Wound Description (Comments):   Present on Admission:     DVT prophylaxis: lovenox Family Communication:gransdaughter Disposition Plan/Barrier to D/C: She will probably pass away in the hospital. Code Status:     Code Status Orders  (From admission, onward)         Start     Ordered   01/01/19 1004  Do not attempt resuscitation (DNR)  Continuous    Question Answer Comment  In the event of cardiac or respiratory ARREST Do not call a "code blue"   In the event of cardiac or respiratory ARREST Do not perform Intubation, CPR, defibrillation or ACLS   In the event of cardiac or respiratory ARREST Use medication by any route, position, wound care, and other measures to relive pain and suffering. May use oxygen, suction and manual treatment of airway obstruction as needed for comfort.      01/01/19 1003        Code Status History    Date Active Date Inactive Code Status Order ID Comments User Context   01/03/2019 2300 01/01/2019 1003 Full Code [id number]  Etta Quill, DO Inpatient   Advance Care Planning Activity        IV Access:    Peripheral IV   Procedures and diagnostic studies:  No results found.   Medical Consultants:    None.  Anti-Infectives:   Remdesivir  Subjective:    [name] nonverbal today.  Objective:    Vitals:   01/07/19 1400 01/07/19 2100 01/08/19 0600 01/08/19 0740  BP:  121/78 119/87 118/81  Pulse: 100   (!) 106  Resp:  18 17   Temp:  (!) 97.1 F (36.2 C) 97.8 F (36.6 C) 97.6 F (36.4 C)  TempSrc:  Oral Axillary Oral  SpO2: 93%   93%   SpO2: 93  % O2 Flow Rate (L/min): 15 L/min   Intake/Output Summary (Last 24 hours) at 01/08/2019 0838 Last data filed at 01/08/2019 0803 Gross per 24 hour  Intake 264.41 ml  Output 400 ml  Net -135.59 ml   There were no vitals filed for this visit.  Exam: General exam: Patient slightly confused in some distress Respiratory system: Good air movement and diffuse crackles bilaterally Cardiovascular system: S1 & S2 heard, RRR. No JVD.  Gastrointestinal system: Abdomen is nondistended, soft and nontender.  Extremities: No pedal edema. Skin: No rashes, lesions or ulcers  Data Reviewed:    Labs: Basic Metabolic Panel: Recent Labs  Lab 01/02/19 0215 01/03/19 0230 01/04/19 0235 01/05/19 0140 01/06/19 0900 01/07/19 0015  NA 141 145 146* 148* 144 141  K 4.5 4.4 4.2 4.3 4.5 4.4  CL 106 107 109 111 110 108  CO2 23 23 22 22 24 23   GLUCOSE 133* 134* 138* 133* 167* 162*  BUN 98* 111* 119* 120* 120* 127*  CREATININE 4.29* 4.06* 3.88* 3.36* 3.35* 3.04*  CALCIUM 7.3* 7.6* 7.9* 8.3* 7.9* 7.9*  MG 1.9 2.2 2.3 2.1  --   --    GFR Estimated Creatinine Clearance: 13 mL/min (A) (by C-G formula based on SCr of 3.04 mg/dL (H)). Liver Function Tests: Recent Labs  Lab 01/02/19 0215 01/03/19 0230 01/04/19 0235 01/05/19 0140  AST 21 15 16 15   ALT 13 13 13 13   ALKPHOS 62 63 65 61  BILITOT 0.2* 0.4 0.7 0.6  PROT 6.1* 6.1* 6.4* 6.1*  ALBUMIN 2.2* 2.3* 2.5* 2.3*   No results for input(s): LIPASE, AMYLASE in the last 168 hours. No results for input(s): AMMONIA in the last 168 hours. Coagulation profile No results for input(s): INR, PROTIME in the last 168 hours. COVID-19 Labs  Recent Labs    01/06/19 0900  DDIMER 10.46*  CRP 6.5*    Lab Results  Component Value Date   SARSCOV2NAA POSITIVE (A) 12/29/2018    CBC: Recent Labs  Lab 01/02/19 0215 01/03/19 0230 01/04/19 0235 01/05/19 0140  WBC 11.7* 14.3* 12.4* 9.6  NEUTROABS 10.7* 13.1* 11.3* 8.4*  HGB 8.8* 9.6* 9.7* 9.0*  HCT  28.3* 29.9* 30.2* 28.7*  MCV 93.4 93.4 92.4 92.6  PLT 180 191 191 174   Cardiac Enzymes: No results for input(s): CKTOTAL, CKMB, CKMBINDEX, TROPONINI in the last 168 hours. BNP (last 3 results) No results for input(s): PROBNP in the last 8760 hours. CBG: Recent Labs  Lab 01/04/19 0754  GLUCAP 122*   D-Dimer: Recent Labs    01/06/19 0900  DDIMER 10.46*   Hgb A1c: No results for input(s): HGBA1C in the last 72 hours. Lipid Profile: No results for input(s): CHOL, HDL, LDLCALC, TRIG, CHOLHDL, LDLDIRECT in the last 72 hours. Thyroid function studies: No results for input(s): TSH, T4TOTAL, T3FREE, THYROIDAB in the last 72 hours.  Invalid input(s): FREET3 Anemia work up: No results for input(s): VITAMINB12, FOLATE, FERRITIN, TIBC, IRON,  RETICCTPCT in the last 72 hours. Sepsis Labs: Recent Labs  Lab 01/02/19 0215 01/03/19 0230 01/04/19 0235 01/05/19 0140  WBC 11.7* 14.3* 12.4* 9.6   Microbiology Recent Results (from the past 240 hour(s))  Culture, blood (Routine x 2)     Status: None   Collection Time: 12/29/2018 10:17 AM   Specimen: BLOOD  Result Value Ref Range Status   Specimen Description BLOOD LEFT ANTECUBITAL  Final   Special Requests   Final    BOTTLES DRAWN AEROBIC AND ANAEROBIC Blood Culture adequate volume   Culture   Final    NO GROWTH 5 DAYS Performed at [facility], [address] [place], [place] [address]    Report Status 01/05/2019 FINAL  Final  Culture, blood (Routine x 2)     Status: None   Collection Time: 12/26/2018 10:28 AM   Specimen: BLOOD  Result Value Ref Range Status   Specimen Description BLOOD R HAND  Final   Special Requests   Final    BOTTLES DRAWN AEROBIC AND ANAEROBIC Blood Culture adequate volume   Culture   Final    NO GROWTH 5 DAYS Performed at [facility], [address], [place],  [address]    Report Status 01/05/2019 FINAL  Final  SARS Coronavirus 2 [facility] order, Performed in [facility]  hospital lab) Nasopharyngeal Nasopharyngeal Swab     Status: Abnormal   Collection Time: 01/18/2019 10:42 AM   Specimen: Nasopharyngeal Swab  Result Value Ref Range Status   SARS Coronavirus 2 POSITIVE (A) NEGATIVE Final    Comment: RESULT CALLED TO, READ BACK BY AND VERIFIED WITH:  Nadyne Coombes AT [id number] 01/05/2019 SDR (NOTE) If result is NEGATIVE SARS-CoV-2 target nucleic acids are NOT DETECTED. The SARS-CoV-2 RNA is generally detectable in upper and lower  respiratory specimens during the acute phase of infection. The lowest  concentration of SARS-CoV-2 viral copies this assay can detect is 250  copies / mL. A negative result does not preclude SARS-CoV-2 infection  and should not be used as the sole basis for treatment or other  patient management decisions.  A negative result may occur with  improper specimen collection / handling, submission of specimen other  than nasopharyngeal swab, presence of viral mutation(s) within the  areas targeted by this assay, and inadequate number of viral copies  (<250 copies / mL). A negative result must be combined with clinical  observations, patient history, and epidemiological information. If result is POSITIVE SARS-CoV-2 target nucleic acids are DETECTED.  The SARS-CoV-2 RNA is generally detectable in upper and lower  respiratory specimens during the acute phase of infection.  Positive  results are indicative of active infection with SARS-CoV-2.  Clinical  correlation with patient history and other diagnostic information is  necessary to determine patient infection status.  Positive results do  not rule out bacterial infection or co-infection with other viruses. If result is PRESUMPTIVE POSTIVE SARS-CoV-2 nucleic acids MAY BE PRESENT.   A presumptive positive result was obtained on the submitted specimen  and confirmed on repeat testing.  While 2019 novel coronavirus  (SARS-CoV-2) nucleic acids may be present in the submitted sample   additional confirmatory testing may be necessary for epidemiological  and / or clinical management purposes  to differentiate between  SARS-CoV-2 and other Sarbecovirus currently known to infect humans.  If clinically indicated additional testing with an alternate test  methodology [phone]) i s advised. The SARS-CoV-2 RNA is generally  detectable in upper and lower respiratory  specimens during the acute  phase of infection. The expected result is Negative. Fact Sheet for Patients:  StrictlyIdeas.no Fact Sheet for Healthcare Providers: BankingDealers.co.za This test is not yet approved or cleared by the [place] FDA and has been authorized for detection and/or diagnosis of SARS-CoV-2 by FDA under an Emergency Use Authorization (EUA).  This EUA will remain in effect (meaning this test can be used) for the duration of the COVID-19 declaration under Section 564(b)(1) of the Act, 21 U.S.C. section 360bbb-3(b)(1), unless the authorization is terminated or revoked sooner. Performed at [facility], [address] [place], [place] [address]      Medications:    Continuous Infusions: . morphine 1 mg/hr (01/08/19 0803)     LOS: 8 days   Charlynne Cousins  [facility]  01/08/2019, 8:38 AM

## 2019-01-09 DIAGNOSIS — L89153 Pressure ulcer of sacral region, stage 3: Secondary | ICD-10-CM

## 2019-01-10 ENCOUNTER — Encounter: Payer: Self-pay | Admitting: Family Medicine

## 2019-01-24 NOTE — Progress Notes (Signed)
Wasted 87.7 ML of morphine from IV bag in stericycle. Witnessed by Idolina Primer, RN.

## 2019-01-24 NOTE — Progress Notes (Signed)
Spoke with pt primary contact [name] and updated on pt condition. Pt currently resting in bed and appears comfortable on morphine drip. Will continue to monitor her throughout shift.

## 2019-01-24 NOTE — Progress Notes (Signed)
NT alerted RN that pt looked more uncomfortable and was moving head and groaning. Notified MD and received verbal order to increase morphine drip to 4 ml/hr. Drip increased accordingly and pt placed on nonrebreather mask for comfort. Will continue to monitor.

## 2019-01-24 NOTE — Progress Notes (Signed)
Pt passed away at 1655 with RN observing. Notified Dr. Venetia Constable and next of kin Moses Carland.

## 2019-01-24 NOTE — Progress Notes (Signed)
Wasted 2cc's of morphine from IV in stericycle with Vinnie Level, RN witnessing.

## 2019-01-24 NOTE — Progress Notes (Signed)
[facility] PROGRESS NOTE    Progress Note  [name]  [id number] DOB: 1923/05/10 DOA: 01/10/2019 PCP: [name], MD     Brief Narrative:   [name] is an 85 y.o. female past medical history significant for chronic systolic heart failure with an EF of 25%, tachybradycardia syndrome status post pacemaker which needs battery replacement in the near future though they are trying to delay given her age, chronic kidney disease stage IV with a baseline creatinine around 2.  Came to [facility] with worsening shortness of breath and dark urine was noted to be hypoxic requiring nonrebreather and acute renal failure with a creatinine like around 6.  Assessment/Plan:   Acute respiratory failure with hypoxia (HCC) due to  Acute respiratory disease due to COVID-19 virus/Acute on chronic systolic CHF (congestive heart failure) [facility]): Patient has multiple risk factors for ARDS and multiorgan failure.   She is requiring 15 L of high flow nasal cannula to keep saturations above 80% She will completed her course of IV Remdesivir and oral dexamethasone. She has an extremely poor prognosis and we have spoken to the family and they have agreed to move towards comfort care. The patient is now on a morphine drip.  No difficulties breathing she seems comfortable.  Acute kidney failure (HCC) on chronic kidney disease stage IV: With a baseline creatinine around 2, likely prerenal azotemia improved with IV fluids.   New hypovolemic hypernatremia: Resolved with D5W.  History of tachybradycardia syndrome with pacemaker: Telemetry has been discontinued, per family cardiologist Battery change due to her advanced age and poor condition and short life expectancy.    Essential hypertension: Acute metabolic encephalopathy:  Pressure injury of skin RN Pressure Injury Documentation: Pressure Injury 01/04/19 Coccyx Stage II -  Partial thickness loss of dermis presenting as a shallow open ulcer  with a red, pink wound bed without slough. (Active)  01/04/19 0612  Location: Coccyx  Location Orientation:   Staging: Stage II -  Partial thickness loss of dermis presenting as a shallow open ulcer with a red, pink wound bed without slough.  Wound Description (Comments):   Present on Admission:     DVT prophylaxis: lovenox Family Communication:gransdaughter Disposition Plan/Barrier to D/C: She will probably pass away in the hospital. Code Status:     Code Status Orders  (From admission, onward)         Start     Ordered   01/01/19 1004  Do not attempt resuscitation (DNR)  Continuous    Question Answer Comment  In the event of cardiac or respiratory ARREST Do not call a "code blue"   In the event of cardiac or respiratory ARREST Do not perform Intubation, CPR, defibrillation or ACLS   In the event of cardiac or respiratory ARREST Use medication by any route, position, wound care, and other measures to relive pain and suffering. May use oxygen, suction and manual treatment of airway obstruction as needed for comfort.      01/01/19 1003        Code Status History    Date Active Date Inactive Code Status Order ID Comments User Context   01/18/2019 2300 01/01/2019 1003 Full Code [id number]  Etta Quill, DO Inpatient   Advance Care Planning Activity        IV Access:    Peripheral IV   Procedures and diagnostic studies:   No results found.   Medical Consultants:    None.  Anti-Infectives:   Remdesivir  Subjective:  [name] nonverbal today.  Objective:    Vitals:   02-05-19 0000 05-Feb-2019 0200 2019-02-05 0345 02/05/2019 0529  BP:   (!) 89/65   Pulse:      Resp: 20   10  Temp:   97.7 F (36.5 C)   TempSrc:   Axillary   SpO2:  (!) 84%  (!) 87%   SpO2: (!) 87 % O2 Flow Rate (L/min): 15 L/min   Intake/Output Summary (Last 24 hours) at 02-05-2019 0752 Last data filed at 02/05/19 0500 Gross per 24 hour  Intake 141.92 ml  Output 175 ml   Net -33.08 ml   There were no vitals filed for this visit.  Exam: General exam: Patient slightly confused in some distress Respiratory system: Good air movement and diffuse crackles bilaterally Cardiovascular system: S1 & S2 heard, RRR. No JVD.  Gastrointestinal system: Abdomen is nondistended, soft and nontender.  Extremities: No pedal edema. Skin: No rashes, lesions or ulcers  Data Reviewed:    Labs: Basic Metabolic Panel: Recent Labs  Lab 01/03/19 0230 01/04/19 0235 01/05/19 0140 01/06/19 0900 01/07/19 0015  NA 145 146* 148* 144 141  K 4.4 4.2 4.3 4.5 4.4  CL 107 109 111 110 108  CO2 23 22 22 24 23   GLUCOSE 134* 138* 133* 167* 162*  BUN 111* 119* 120* 120* 127*  CREATININE 4.06* 3.88* 3.36* 3.35* 3.04*  CALCIUM 7.6* 7.9* 8.3* 7.9* 7.9*  MG 2.2 2.3 2.1  --   --    GFR Estimated Creatinine Clearance: 13 mL/min (A) (by C-G formula based on SCr of 3.04 mg/dL (H)). Liver Function Tests: Recent Labs  Lab 01/03/19 0230 01/04/19 0235 01/05/19 0140  AST 15 16 15   ALT 13 13 13   ALKPHOS 63 65 61  BILITOT 0.4 0.7 0.6  PROT 6.1* 6.4* 6.1*  ALBUMIN 2.3* 2.5* 2.3*   No results for input(s): LIPASE, AMYLASE in the last 168 hours. No results for input(s): AMMONIA in the last 168 hours. Coagulation profile No results for input(s): INR, PROTIME in the last 168 hours. COVID-19 Labs  Recent Labs    01/06/19 0900  DDIMER 10.46*  CRP 6.5*    Lab Results  Component Value Date   SARSCOV2NAA POSITIVE (A) 01/21/2019    CBC: Recent Labs  Lab 01/03/19 0230 01/04/19 0235 01/05/19 0140  WBC 14.3* 12.4* 9.6  NEUTROABS 13.1* 11.3* 8.4*  HGB 9.6* 9.7* 9.0*  HCT 29.9* 30.2* 28.7*  MCV 93.4 92.4 92.6  PLT 191 191 174   Cardiac Enzymes: No results for input(s): CKTOTAL, CKMB, CKMBINDEX, TROPONINI in the last 168 hours. BNP (last 3 results) No results for input(s): PROBNP in the last 8760 hours. CBG: Recent Labs  Lab 01/04/19 0754  GLUCAP 122*    D-Dimer: Recent Labs    01/06/19 0900  DDIMER 10.46*   Hgb A1c: No results for input(s): HGBA1C in the last 72 hours. Lipid Profile: No results for input(s): CHOL, HDL, LDLCALC, TRIG, CHOLHDL, LDLDIRECT in the last 72 hours. Thyroid function studies: No results for input(s): TSH, T4TOTAL, T3FREE, THYROIDAB in the last 72 hours.  Invalid input(s): FREET3 Anemia work up: No results for input(s): VITAMINB12, FOLATE, FERRITIN, TIBC, IRON, RETICCTPCT in the last 72 hours. Sepsis Labs: Recent Labs  Lab 01/03/19 0230 01/04/19 0235 01/05/19 0140  WBC 14.3* 12.4* 9.6   Microbiology Recent Results (from the past 240 hour(s))  Culture, blood (Routine x 2)     Status: None   Collection Time: 12/24/2018 10:17 AM  Specimen: BLOOD  Result Value Ref Range Status   Specimen Description BLOOD LEFT ANTECUBITAL  Final   Special Requests   Final    BOTTLES DRAWN AEROBIC AND ANAEROBIC Blood Culture adequate volume   Culture   Final    NO GROWTH 5 DAYS Performed at [facility], [address] [place], [place] [address]    Report Status 01/05/2019 FINAL  Final  Culture, blood (Routine x 2)     Status: None   Collection Time: 12/27/2018 10:28 AM   Specimen: BLOOD  Result Value Ref Range Status   Specimen Description BLOOD R HAND  Final   Special Requests   Final    BOTTLES DRAWN AEROBIC AND ANAEROBIC Blood Culture adequate volume   Culture   Final    NO GROWTH 5 DAYS Performed at [facility], [address], [place], [place] [address]    Report Status 01/05/2019 FINAL  Final  SARS Coronavirus 2 [facility] order, Performed in [facility] hospital lab) Nasopharyngeal Nasopharyngeal Swab     Status: Abnormal   Collection Time: 01/19/2019 10:42 AM   Specimen: Nasopharyngeal Swab  Result Value Ref Range Status   SARS Coronavirus 2 POSITIVE (A) NEGATIVE Final    Comment: RESULT CALLED TO, READ BACK BY AND VERIFIED WITH:  Nadyne Coombes AT [id number] 01/10/2019 SDR (NOTE) If  result is NEGATIVE SARS-CoV-2 target nucleic acids are NOT DETECTED. The SARS-CoV-2 RNA is generally detectable in upper and lower  respiratory specimens during the acute phase of infection. The lowest  concentration of SARS-CoV-2 viral copies this assay can detect is 250  copies / mL. A negative result does not preclude SARS-CoV-2 infection  and should not be used as the sole basis for treatment or other  patient management decisions.  A negative result may occur with  improper specimen collection / handling, submission of specimen other  than nasopharyngeal swab, presence of viral mutation(s) within the  areas targeted by this assay, and inadequate number of viral copies  (<250 copies / mL). A negative result must be combined with clinical  observations, patient history, and epidemiological information. If result is POSITIVE SARS-CoV-2 target nucleic acids are DETECTED.  The SARS-CoV-2 RNA is generally detectable in upper and lower  respiratory specimens during the acute phase of infection.  Positive  results are indicative of active infection with SARS-CoV-2.  Clinical  correlation with patient history and other diagnostic information is  necessary to determine patient infection status.  Positive results do  not rule out bacterial infection or co-infection with other viruses. If result is PRESUMPTIVE POSTIVE SARS-CoV-2 nucleic acids MAY BE PRESENT.   A presumptive positive result was obtained on the submitted specimen  and confirmed on repeat testing.  While 2019 novel coronavirus  (SARS-CoV-2) nucleic acids may be present in the submitted sample  additional confirmatory testing may be necessary for epidemiological  and / or clinical management purposes  to differentiate between  SARS-CoV-2 and other Sarbecovirus currently known to infect humans.  If clinically indicated additional testing with an alternate test  methodology [phone]) i s advised. The SARS-CoV-2 RNA is generally   detectable in upper and lower respiratory specimens during the acute  phase of infection. The expected result is Negative. Fact Sheet for Patients:  StrictlyIdeas.no Fact Sheet for Healthcare Providers: BankingDealers.co.za This test is not yet approved or cleared by the [place] FDA and has been authorized for detection and/or diagnosis of SARS-CoV-2 by FDA under an Emergency Use Authorization (EUA).  This EUA will remain in effect (meaning this test can be used) for the duration of the COVID-19 declaration under Section 564(b)(1) of the Act, 21 U.S.C. section 360bbb-3(b)(1), unless the authorization is terminated or revoked sooner. Performed at [facility], [address] [place], [place] [address]      Medications:    Continuous Infusions: . morphine 2 mg/hr (01/08/19 1800)     LOS: 9 days   Charlynne Cousins  [facility]  02-04-19, 7:52 AM

## 2019-01-24 NOTE — Discharge Summary (Signed)
Death Summary  [name] [id number] DOB: February 02, 1923 DOA: Jan 01, 2019  PCP: [name], MD  Admit date: 01/01/19 Date of Death: 2019/01/10 Time of Death: 08-18-1653 Notification: [name], MD notified of death of 01-10-2019   History of present illness:  [name] 85 y.o. female past medical history significant for chronic systolic heart failure with an EF of 25%, tachybradycardia syndrome status post pacemaker which needs battery replacement in the near future though they are trying to delay given her age, chronic kidney disease stage IV with a baseline creatinine around 2.  Came to [facility] with worsening shortness of breath and dark urine was noted to be hypoxic requiring nonrebreather and acute renal failure with a creatinine like around 6.  Acute respiratory failure with hypoxia due to COVID-19 viral infection/acute on chronic systolic heart failure She was started on 6 L of nasal cannula, she completed her course of IV Remdesivir and steroids despite this she continued to worsen requiring 15 L of high flow nasal cannula to keep saturations above 80%. After talking with the daughter and explaining to her and her extremely poor prognosis due to her multiple comorbidities for ARDS and multiorgan failure and with minimal improvement in her creatinine her daughter decided to move towards comfort care. She was placed on a morphine drip she passed away in the hospital.  Acute kidney injury on chronic kidney disease stage IV: With a baseline creatinine around 2, on admission of 6 it improved with IV fluid hydration.  New hypovolemic hypernatremia: Resolved with D5W.  History of tachybradycardia syndrome: Per family due to the significant decline in her overall the last several months, advanced age and poor condition cardiology delayed battery change.  Essential hypertension: Remain fairly controlled in the hospital.  Acute metabolic encephalopathy: Normal improvement with  treatment of her COVID-19 IV hydration.  Pressure ulcer present on admission: Pressure Injury 01/04/19 Coccyx Stage II -  Partial thickness loss of dermis presenting as a shallow open ulcer with a red, pink wound bed without slough. (Active)  01/04/19 0612  Location: Coccyx  Location Orientation:   Staging: Stage II -  Partial thickness loss of dermis presenting as a shallow open ulcer with a red, pink wound bed without slough.  Wound Description (Comments):   Present on Admission:       The results of significant diagnostics from this hospitalization (including imaging, microbiology, ancillary and laboratory) are listed below for reference.    Significant Diagnostic Studies: US Renal  Result Date: 01/01/2019 CLINICAL DATA:  Initial evaluation for acute renal failure. EXAM: RENAL / URINARY TRACT ULTRASOUND COMPLETE COMPARISON:  Prior ultrasound from 12/01/2015 FINDINGS: Right Kidney: Renal measurements: 9.4 x 4.4 x 4.6 cm = volume: 99.8 mL. Diffusely increased echogenicity within the renal parenchyma, compatible with medical renal disease. Underlying mild diffuse cortical thinning. No hydronephrosis. 3.2 x 2.9 x 2.1 cm minimally complex cyst present at the interpolar region. Additional 1.7 x 1.4 x 1.2 cm minimally complex cyst at the lower pole. Left Kidney: Renal measurements: 9.5 x 5.6 x 5.7 cm = volume: 156.8 mL. Diffusely increased echogenicity within the renal parenchyma, compatible with medical renal disease. Underlying mild diffuse cortical thinning. No hydronephrosis. No focal renal lesion. Bladder: Decompressed with a Foley catheter in place. IMPRESSION: 1. Diffusely increased echogenicity within the renal parenchyma, suggesting medical renal disease. 2. No hydronephrosis. 3. Right renal cysts measuring up to 3.2 cm as above. Electronically Signed   By: Jeannine Boga M.D.   On:  01/01/2019 19:42   Dg Chest Portable 1 View  Result Date: 12/30/2018 CLINICAL DATA:  Cough EXAM:  PORTABLE CHEST 1 VIEW COMPARISON:  11/24/2017 chest radiograph. FINDINGS: Stable configuration of 3 lead right subclavian pacemaker. Stable cardiomediastinal silhouette with moderate cardiomegaly. No pneumothorax. No pleural effusion. There is patchy parahilar fluffy and linear opacities throughout both lungs. IMPRESSION: Patchy fluffy and linear parahilar opacities throughout both lungs with moderate cardiomegaly, favor cardiogenic pulmonary edema. Electronically Signed   By: Ilona Sorrel M.D.   On: 01/10/2019 11:05    Microbiology: Recent Results (from the past 240 hour(s))  Culture, blood (Routine x 2)     Status: None   Collection Time: 01/11/2019 10:17 AM   Specimen: BLOOD  Result Value Ref Range Status   Specimen Description BLOOD LEFT ANTECUBITAL  Final   Special Requests   Final    BOTTLES DRAWN AEROBIC AND ANAEROBIC Blood Culture adequate volume   Culture   Final    NO GROWTH 5 DAYS Performed at [facility], [address], [place], [place] [address]    Report Status 01/05/2019 FINAL  Final  Culture, blood (Routine x 2)     Status: None   Collection Time: 12/29/2018 10:28 AM   Specimen: BLOOD  Result Value Ref Range Status   Specimen Description BLOOD R HAND  Final   Special Requests   Final    BOTTLES DRAWN AEROBIC AND ANAEROBIC Blood Culture adequate volume   Culture   Final    NO GROWTH 5 DAYS Performed at [facility], [address] [place],  [address]    Report Status 01/05/2019 FINAL  Final  SARS Coronavirus 2 [facility] order, Performed in [facility] hospital lab) Nasopharyngeal Nasopharyngeal Swab     Status: Abnormal   Collection Time: 01/22/2019 10:42 AM   Specimen: Nasopharyngeal Swab  Result Value Ref Range Status   SARS Coronavirus 2 POSITIVE (A) NEGATIVE Final    Comment: RESULT CALLED TO, READ BACK BY AND VERIFIED WITH:  Nadyne Coombes AT [id number] 12/29/2018 SDR (NOTE) If result is NEGATIVE SARS-CoV-2 target nucleic acids are NOT  DETECTED. The SARS-CoV-2 RNA is generally detectable in upper and lower  respiratory specimens during the acute phase of infection. The lowest  concentration of SARS-CoV-2 viral copies this assay can detect is 250  copies / mL. A negative result does not preclude SARS-CoV-2 infection  and should not be used as the sole basis for treatment or other  patient management decisions.  A negative result may occur with  improper specimen collection / handling, submission of specimen other  than nasopharyngeal swab, presence of viral mutation(s) within the  areas targeted by this assay, and inadequate number of viral copies  (<250 copies / mL). A negative result must be combined with clinical  observations, patient history, and epidemiological information. If result is POSITIVE SARS-CoV-2 target nucleic acids are DETECTED.  The SARS-CoV-2 RNA is generally detectable in upper and lower  respiratory specimens during the acute phase of infection.  Positive  results are indicative of active infection with SARS-CoV-2.  Clinical  correlation with patient history and other diagnostic information is  necessary to determine patient infection status.  Positive results do  not rule out bacterial infection or co-infection with other viruses. If result is PRESUMPTIVE POSTIVE SARS-CoV-2 nucleic acids MAY BE PRESENT.   A presumptive positive result was obtained on the submitted specimen  and confirmed on repeat testing.  While 2019 novel coronavirus  (SARS-CoV-2) nucleic acids may  be present in the submitted sample  additional confirmatory testing may be necessary for epidemiological  and / or clinical management purposes  to differentiate between  SARS-CoV-2 and other Sarbecovirus currently known to infect humans.  If clinically indicated additional testing with an alternate test  methodology [phone]) i s advised. The SARS-CoV-2 RNA is generally  detectable in upper and lower respiratory specimens during  the acute  phase of infection. The expected result is Negative. Fact Sheet for Patients:  StrictlyIdeas.no Fact Sheet for Healthcare Providers: BankingDealers.co.za This test is not yet approved or cleared by the [place] FDA and has been authorized for detection and/or diagnosis of SARS-CoV-2 by FDA under an Emergency Use Authorization (EUA).  This EUA will remain in effect (meaning this test can be used) for the duration of the COVID-19 declaration under Section 564(b)(1) of the Act, 21 U.S.C. section 360bbb-3(b)(1), unless the authorization is terminated or revoked sooner. Performed at [facility], [address] [place], [place] [address]      Labs: Basic Metabolic Panel: Recent Labs  Lab 01/03/19 0230 01/04/19 0235 01/05/19 0140 01/06/19 0900 01/07/19 0015  NA 145 146* 148* 144 141  K 4.4 4.2 4.3 4.5 4.4  CL 107 109 111 110 108  CO2 23 22 22 24 23   GLUCOSE 134* 138* 133* 167* 162*  BUN 111* 119* 120* 120* 127*  CREATININE 4.06* 3.88* 3.36* 3.35* 3.04*  CALCIUM 7.6* 7.9* 8.3* 7.9* 7.9*  MG 2.2 2.3 2.1  --   --    Liver Function Tests: Recent Labs  Lab 01/03/19 0230 01/04/19 0235 01/05/19 0140  AST 15 16 15   ALT 13 13 13   ALKPHOS 63 65 61  BILITOT 0.4 0.7 0.6  PROT 6.1* 6.4* 6.1*  ALBUMIN 2.3* 2.5* 2.3*   No results for input(s): LIPASE, AMYLASE in the last 168 hours. No results for input(s): AMMONIA in the last 168 hours. CBC: Recent Labs  Lab 01/03/19 0230 01/04/19 0235 01/05/19 0140  WBC 14.3* 12.4* 9.6  NEUTROABS 13.1* 11.3* 8.4*  HGB 9.6* 9.7* 9.0*  HCT 29.9* 30.2* 28.7*  MCV 93.4 92.4 92.6  PLT 191 191 174   Cardiac Enzymes: No results for input(s): CKTOTAL, CKMB, CKMBINDEX, TROPONINI in the last 168 hours. D-Dimer No results for input(s): DDIMER in the last 72 hours. BNP: Invalid input(s): POCBNP CBG: Recent Labs  Lab 01/04/19 0754  GLUCAP 122*   Anemia work up No  results for input(s): VITAMINB12, FOLATE, FERRITIN, TIBC, IRON, RETICCTPCT in the last 72 hours. Urinalysis    Component Value Date/Time   COLORURINE YELLOW 01/01/2019 0955   APPEARANCEUR CLOUDY (A) 01/01/2019 0955   LABSPEC 1.013 01/01/2019 0955   PHURINE 5.0 01/01/2019 0955   GLUCOSEU NEGATIVE 01/01/2019 0955   HGBUR MODERATE (A) 01/01/2019 0955   BILIRUBINUR NEGATIVE 01/01/2019 0955   KETONESUR NEGATIVE 01/01/2019 0955   PROTEINUR NEGATIVE 01/01/2019 0955   NITRITE NEGATIVE 01/01/2019 0955   LEUKOCYTESUR MODERATE (A) 01/01/2019 0955   Sepsis Labs Invalid input(s): PROCALCITONIN,  WBC,  LACTICIDVEN   SIGNED:  Charlynne Cousins, MD  [facility] 02-08-2019, 4:55 PM Pager   If 7PM-7AM, please contact night-coverage [email or web address] Password [email or web address]

## 2019-01-24 DEATH — deceased

## 2019-01-28 DIAGNOSIS — M6281 Muscle weakness (generalized): Secondary | ICD-10-CM | POA: Diagnosis not present

## 2019-01-28 DIAGNOSIS — M818 Other osteoporosis without current pathological fracture: Secondary | ICD-10-CM | POA: Diagnosis not present

## 2019-01-28 DIAGNOSIS — R609 Edema, unspecified: Secondary | ICD-10-CM | POA: Diagnosis not present

## 2019-01-28 DIAGNOSIS — I5022 Chronic systolic (congestive) heart failure: Secondary | ICD-10-CM | POA: Diagnosis not present
